# Patient Record
Sex: Male | Born: 1955 | Race: Black or African American | Hispanic: No | Marital: Married | Smoking: Former smoker
Health system: Southern US, Community
[De-identification: ages and names within clinical notes are randomized; demographics above are authoritative.]

## PROBLEM LIST (undated history)

## (undated) DIAGNOSIS — R760 Raised antibody titer: Secondary | ICD-10-CM

## (undated) DIAGNOSIS — R058 Other specified cough: Secondary | ICD-10-CM

## (undated) DIAGNOSIS — D6861 Antiphospholipid syndrome: Secondary | ICD-10-CM

## (undated) DIAGNOSIS — I81 Portal vein thrombosis: Secondary | ICD-10-CM

## (undated) DIAGNOSIS — U071 COVID-19: Secondary | ICD-10-CM

## (undated) DIAGNOSIS — R7303 Prediabetes: Secondary | ICD-10-CM

## (undated) DIAGNOSIS — Z973 Presence of spectacles and contact lenses: Secondary | ICD-10-CM

## (undated) DIAGNOSIS — I10 Essential (primary) hypertension: Secondary | ICD-10-CM

## (undated) DIAGNOSIS — C61 Malignant neoplasm of prostate: Secondary | ICD-10-CM

## (undated) DIAGNOSIS — T464X5A Adverse effect of angiotensin-converting-enzyme inhibitors, initial encounter: Secondary | ICD-10-CM

## (undated) DIAGNOSIS — R519 Headache, unspecified: Secondary | ICD-10-CM

## (undated) HISTORY — PX: PROSTATE BIOPSY: SHX241

## (undated) HISTORY — PX: TIPS PROCEDURE: SHX808

---

## 2011-06-30 ENCOUNTER — Emergency Department (HOSPITAL_COMMUNITY)
Admission: EM | Admit: 2011-06-30 | Discharge: 2011-06-30 | Disposition: A | Discharge status: Home / Self Care | Payer: Self-pay | Attending: Emergency Medicine | Admitting: Emergency Medicine

## 2011-06-30 ENCOUNTER — Encounter (HOSPITAL_COMMUNITY): Payer: Self-pay | Admitting: *Deleted

## 2011-06-30 ENCOUNTER — Emergency Department (HOSPITAL_COMMUNITY): Payer: Self-pay

## 2011-06-30 DIAGNOSIS — R079 Chest pain, unspecified: Secondary | ICD-10-CM | POA: Insufficient documentation

## 2011-06-30 LAB — BASIC METABOLIC PANEL
BUN: 17 mg/dL (ref 6–23)
Chloride: 102 mEq/L (ref 96–112)
Creatinine, Ser: 1.06 mg/dL (ref 0.50–1.35)
GFR calc Af Amer: 89 mL/min — ABNORMAL LOW (ref >90)

## 2011-06-30 LAB — CBC
HCT: 37.9 % — ABNORMAL LOW (ref 39.0–52.0)
MCV: 78.3 fL (ref 78.0–100.0)
RDW: 14.3 % (ref 11.5–15.5)
WBC: 4.7 10*3/uL (ref 4.0–10.5)

## 2011-06-30 MED ORDER — GI COCKTAIL ~~LOC~~
30.0000 mL | Freq: Once | ORAL | Status: AC
  Administered 2011-06-30: 30 mL via ORAL
  Filled 2011-06-30: qty 30

## 2011-06-30 MED ORDER — OMEPRAZOLE 20 MG PO CPDR: 20.0000 mg | DELAYED_RELEASE_CAPSULE | Freq: Every day | ORAL | Status: DC

## 2011-06-30 NOTE — ED Provider Notes (Signed)
History     CSN: [id number]  Arrival date & time 06/30/11  1844   First MD Initiated Contact with Patient 06/30/11 1943      Chief Complaint  Patient presents with  . Chest Pain     The history is provided by the patient.   the patient reports developing severe chest pain this morning that started approximately 11 AM and lasted for a few seconds.  He had no associated symptoms.  He then went and took a long nap.  He awoke and had some slight discomfort in his chest it was described as a tightness that lasted for seconds as well.  At this time he denies all chest discomfort but reports that he scared to take a deep breath because he thinks this may cause the pain to return.  When he takes deep breaths at the bedside now he reports no chest pain.  He denies unilateral leg swelling.  No prior history of DVT or pulmonary embolism.  History reviewed. No pertinent past medical history.  History reviewed. No pertinent past surgical history.  No family history on file.  History  Substance Use Topics  . Smoking status: Never Smoker   . Smokeless tobacco: Not on file  . Alcohol Use: No      Review of Systems  Cardiovascular: Positive for chest pain.  All other systems reviewed and are negative.    Allergies  Review of patient's allergies indicates no known allergies.  Home Medications  No current outpatient prescriptions on file.  BP 133/94  Pulse 69  Temp(Src) 98.1 F (36.7 C) (Oral)  Resp 20  SpO2 100%  Physical Exam  Nursing note and vitals reviewed. Constitutional: He is oriented to person, place, and time. He appears well-developed and well-nourished.  HENT:  Head: Normocephalic and atraumatic.  Eyes: EOM are normal.  Neck: Normal range of motion.  Cardiovascular: Normal rate, regular rhythm, normal heart sounds and intact distal pulses.   Pulmonary/Chest: Effort normal and breath sounds normal. No respiratory distress.  Abdominal: Soft. He exhibits no  distension. There is no tenderness.  Musculoskeletal: Normal range of motion.  Neurological: He is alert and oriented to person, place, and time.  Skin: Skin is warm and dry.  Psychiatric: He has a normal mood and affect. Judgment normal.    ED Course  Procedures (including critical care time)   Date: 06/30/2011  Rate: 60  Rhythm: normal sinus rhythm  QRS Axis: normal  Intervals: normal  ST/T Wave abnormalities: normal  Conduction Disutrbances: none  Narrative Interpretation:   Old EKG Reviewed: No significant changes noted     Labs Reviewed  CBC - Abnormal; Notable for the following:    HCT 37.9 (*)    All other components within normal limits  BASIC METABOLIC PANEL - Abnormal; Notable for the following:    GFR calc non Af Amer 77 (*)    GFR calc Af Amer 89 (*)    All other components within normal limits  TROPONIN I   Dg Chest 2 View  06/30/2011  *RADIOLOGY REPORT*  Clinical Data: 56 year old male with chest pain.  CHEST - 2 VIEW  Comparison: None.  Findings: Lung volumes are within normal limits.  Cardiac size and mediastinal contours are within normal limits.  Visualized tracheal air column is within normal limits.  No pneumothorax, pulmonary edema, pleural effusion or confluent pulmonary opacity. No acute osseous abnormality identified.  IMPRESSION: No acute cardiopulmonary abnormality.  Original Report Authenticated By: Ulla Potash III,  M.D.     1. Chest pain       MDM  The patient's chest pain was transient.  I suspect this is GI related chest pain.  My suspicion for ACS is very low.  EKG is normal.  Single set of troponins is normal.  DC home with close followup with primary care Dr.  He knows to return the emergency department for new or worsening symptoms        Lyanne Co, MD 06/30/11 2156

## 2011-06-30 NOTE — ED Notes (Signed)
Pt reports sharp sudden chest pain while laying down when he got home from work this am at 1100.  Pt reports pain last only for a couple of secs.  Pt states "i kinda wait around and i even went to sleep on it."  Pt reports having a funny feeling in his chest but not chest pain.  Pt states he's scared to breathe too hard d/t pain recurrence.

## 2011-06-30 NOTE — Discharge Instructions (Signed)
Chest Pain (Nonspecific) It is often hard to give a specific diagnosis for the cause of chest pain. There is always a chance that your pain could be related to something serious, such as a heart attack or a blood clot in the lungs. You need to follow up with your caregiver for further evaluation. CAUSES   Heartburn.   Pneumonia or bronchitis.   Anxiety or stress.   Inflammation around your heart (pericarditis) or lung (pleuritis or pleurisy).   A blood clot in the lung.   A collapsed lung (pneumothorax). It can develop suddenly on its own (spontaneous pneumothorax) or from injury (trauma) to the chest.   Shingles infection (herpes zoster virus).  The chest wall is composed of bones, muscles, and cartilage. Any of these can be the source of the pain.  The bones can be bruised by injury.   The muscles or cartilage can be strained by coughing or overwork.   The cartilage can be affected by inflammation and become sore (costochondritis).  DIAGNOSIS  Lab tests or other studies, such as X-rays, electrocardiography, stress testing, or cardiac imaging, may be needed to find the cause of your pain.  TREATMENT   Treatment depends on what may be causing your chest pain. Treatment may include:   Acid blockers for heartburn.   Anti-inflammatory medicine.   Pain medicine for inflammatory conditions.   Antibiotics if an infection is present.   You may be advised to change lifestyle habits. This includes stopping smoking and avoiding alcohol, caffeine, and chocolate.   You may be advised to keep your head raised (elevated) when sleeping. This reduces the chance of acid going backward from your stomach into your esophagus.   Most of the time, nonspecific chest pain will improve within 2 to 3 days with rest and mild pain medicine.  HOME CARE INSTRUCTIONS   If antibiotics were prescribed, take your antibiotics as directed. Finish them even if you start to feel better.   For the next few  days, avoid physical activities that bring on chest pain. Continue physical activities as directed.   Do not smoke.   Avoid drinking alcohol.   Only take over-the-counter or prescription medicine for pain, discomfort, or fever as directed by your caregiver.   Follow your caregiver's suggestions for further testing if your chest pain does not go away.   Keep any follow-up appointments you made. If you do not go to an appointment, you could develop lasting (chronic) problems with pain. If there is any problem keeping an appointment, you must call to reschedule.  SEEK MEDICAL CARE IF:   You think you are having problems from the medicine you are taking. Read your medicine instructions carefully.   Your chest pain does not go away, even after treatment.   You develop a rash with blisters on your chest.  SEEK IMMEDIATE MEDICAL CARE IF:   You have increased chest pain or pain that spreads to your arm, neck, jaw, back, or abdomen.   You develop shortness of breath, an increasing cough, or you are coughing up blood.   You have severe back or abdominal pain, feel nauseous, or vomit.   You develop severe weakness, fainting, or chills.   You have a fever.  THIS IS AN EMERGENCY. Do not wait to see if the pain will go away. Get medical help at once. Call your local emergency services (911 in [place] Do not drive yourself to the hospital. MAKE SURE YOU:   Understand these instructions.     Will watch your condition.   Will get help right away if you are not doing well or get worse.  Document Released: 11/20/2004 Document Revised: 01/30/2011 Document Reviewed: 09/16/2007 [employer] Patient Information 2012 [employer]. 

## 2014-06-21 DIAGNOSIS — Z79899 Other long term (current) drug therapy: Secondary | ICD-10-CM | POA: Diagnosis not present

## 2014-06-21 DIAGNOSIS — Z041 Encounter for examination and observation following transport accident: Secondary | ICD-10-CM | POA: Insufficient documentation

## 2014-06-21 DIAGNOSIS — Y92410 Unspecified street and highway as the place of occurrence of the external cause: Secondary | ICD-10-CM | POA: Insufficient documentation

## 2014-06-21 DIAGNOSIS — Y9389 Activity, other specified: Secondary | ICD-10-CM | POA: Insufficient documentation

## 2014-06-21 DIAGNOSIS — Y998 Other external cause status: Secondary | ICD-10-CM | POA: Insufficient documentation

## 2014-06-22 ENCOUNTER — Encounter (HOSPITAL_COMMUNITY): Payer: Self-pay | Admitting: Emergency Medicine

## 2014-06-22 ENCOUNTER — Emergency Department (HOSPITAL_COMMUNITY)
Admission: EM | Admit: 2014-06-22 | Discharge: 2014-06-22 | Disposition: A | Discharge status: Home / Self Care | Payer: No Typology Code available for payment source | Attending: Emergency Medicine | Admitting: Emergency Medicine

## 2014-06-22 DIAGNOSIS — Z0000 Encounter for general adult medical examination without abnormal findings: Secondary | ICD-10-CM

## 2014-06-22 MED ORDER — IBUPROFEN 800 MG PO TABS: 800.0000 mg | ORAL_TABLET | Freq: Three times a day (TID) | ORAL | Status: DC
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
## 2014-06-22 NOTE — ED Notes (Signed)
Pt ambulating independently w/ steady gait on d/c in no acute distress, A&Ox4. D/c instructions reviewed w/ pt and family - pt and family deny any further questions or concerns at present. Rx given x1  

## 2014-06-22 NOTE — Discharge Instructions (Signed)
Cryotherapy °Cryotherapy means treatment with cold. Ice or gel packs can be used to reduce both pain and swelling. Ice is the most helpful within the first 24 to 48 hours after an injury or flare-up from overusing a muscle or joint. Sprains, strains, spasms, burning pain, shooting pain, and aches can all be eased with ice. Ice can also be used when recovering from surgery. Ice is effective, has very few side effects, and is safe for most people to use. °PRECAUTIONS  °Ice is not a safe treatment option for people with: °· Raynaud phenomenon. This is a condition affecting small blood vessels in the extremities. Exposure to cold may cause your problems to return. °· Cold hypersensitivity. There are many forms of cold hypersensitivity, including: °¨ Cold urticaria. Red, itchy hives appear on the skin when the tissues begin to warm after being iced. °¨ Cold erythema. This is a red, itchy rash caused by exposure to cold. °¨ Cold hemoglobinuria. Red blood cells break down when the tissues begin to warm after being iced. The hemoglobin that carry oxygen are passed into the urine because they cannot combine with blood proteins fast enough. °· Numbness or altered sensitivity in the area being iced. °If you have any of the following conditions, do not use ice until you have discussed cryotherapy with your caregiver: °· Heart conditions, such as arrhythmia, angina, or chronic heart disease. °· High blood pressure. °· Healing wounds or open skin in the area being iced. °· Current infections. °· Rheumatoid arthritis. °· Poor circulation. °· Diabetes. °Ice slows the blood flow in the region it is applied. This is beneficial when trying to stop inflamed tissues from spreading irritating chemicals to surrounding tissues. However, if you expose your skin to cold temperatures for too long or without the proper protection, you can damage your skin or nerves. Watch for signs of skin damage due to cold. °HOME CARE INSTRUCTIONS °Follow  these tips to use ice and cold packs safely. °· Place a dry or damp towel between the ice and skin. A damp towel will cool the skin more quickly, so you may need to shorten the time that the ice is used. °· For a more rapid response, add gentle compression to the ice. °· Ice for no more than 10 to 20 minutes at a time. The bonier the area you are icing, the less time it will take to get the benefits of ice. °· Check your skin after 5 minutes to make sure there are no signs of a poor response to cold or skin damage. °· Rest 20 minutes or more between uses. °· Once your skin is numb, you can end your treatment. You can test numbness by very lightly touching your skin. The touch should be so light that you do not see the skin dimple from the pressure of your fingertip. When using ice, most people will feel these normal sensations in this order: cold, burning, aching, and numbness. °· Do not use ice on someone who cannot communicate their responses to pain, such as small children or people with dementia. °HOW TO MAKE AN ICE PACK °Ice packs are the most common way to use ice therapy. Other methods include ice massage, ice baths, and cryosprays. Muscle creams that cause a cold, tingly feeling do not offer the same benefits that ice offers and should not be used as a substitute unless recommended by your caregiver. °To make an ice pack, do one of the following: °· Place crushed ice or a   bag of frozen vegetables in a sealable plastic bag. Squeeze out the excess air. Place this bag inside another plastic bag. Slide the bag into a pillowcase or place a damp towel between your skin and the bag.  Mix 3 parts water with 1 part rubbing alcohol. Freeze the mixture in a sealable plastic bag. When you remove the mixture from the freezer, it will be slushy. Squeeze out the excess air. Place this bag inside another plastic bag. Slide the bag into a pillowcase or place a damp towel between your skin and the bag. SEEK MEDICAL CARE  IF:  You develop white spots on your skin. This may give the skin a blotchy (mottled) appearance.  Your skin turns blue or pale.  Your skin becomes waxy or hard.  Your swelling gets worse. MAKE SURE YOU:   Understand these instructions.  Will watch your condition.  Will get help right away if you are not doing well or get worse. Document Released: 10/07/2010 Document Revised: 06/27/2013 Document Reviewed: 10/07/2010 [facility] Patient Information 2015 [place], [place]. This information is not intended to replace advice given to you by your health care provider. Make sure you discuss any questions you have with your health care provider. Motor Vehicle Collision It is common to have multiple bruises and sore muscles after a motor vehicle collision (MVC). These tend to feel worse for the first 24 hours. You may have the most stiffness and soreness over the first several hours. You may also feel worse when you wake up the first morning after your collision. After this point, you will usually begin to improve with each day. The speed of improvement often depends on the severity of the collision, the number of injuries, and the location and nature of these injuries. HOME CARE INSTRUCTIONS  Put ice on the injured area.  Put ice in a plastic bag.  Place a towel between your skin and the bag.  Leave the ice on for 15-20 minutes, 3-4 times a day, or as directed by your health care provider.  Drink enough fluids to keep your urine clear or pale yellow. Do not drink alcohol.  Take a warm shower or bath once or twice a day. This will increase blood flow to sore muscles.  You may return to activities as directed by your caregiver. Be careful when lifting, as this may aggravate neck or back pain.  Only take over-the-counter or prescription medicines for pain, discomfort, or fever as directed by your caregiver. Do not use aspirin. This may increase bruising and bleeding. SEEK IMMEDIATE MEDICAL CARE  IF:  You have numbness, tingling, or weakness in the arms or legs.  You develop severe headaches not relieved with medicine.  You have severe neck pain, especially tenderness in the middle of the back of your neck.  You have changes in bowel or bladder control.  There is increasing pain in any area of the body.  You have shortness of breath, light-headedness, dizziness, or fainting.  You have chest pain.  You feel sick to your stomach (nauseous), throw up (vomit), or sweat.  You have increasing abdominal discomfort.  There is blood in your urine, stool, or vomit.  You have pain in your shoulder (shoulder strap areas).  You feel your symptoms are getting worse. MAKE SURE YOU:  Understand these instructions.  Will watch your condition.  Will get help right away if you are not doing well or get worse. Document Released: 02/10/2005 Document Revised: 06/27/2013 Document Reviewed: 07/10/2010 [employer] Patient  Information 2015 [place], [place]. This information is not intended to replace advice given to you by your health care provider. Make sure you discuss any questions you have with your health care provider.

## 2016-01-07 DIAGNOSIS — K5790 Diverticulosis of intestine, part unspecified, without perforation or abscess without bleeding: Secondary | ICD-10-CM | POA: Insufficient documentation

## 2016-04-29 ENCOUNTER — Emergency Department (HOSPITAL_COMMUNITY)
Admission: EM | Admit: 2016-04-29 | Discharge: 2016-04-29 | Disposition: A | Discharge status: Home / Self Care | Payer: No Typology Code available for payment source | Attending: Emergency Medicine | Admitting: Emergency Medicine

## 2016-04-29 ENCOUNTER — Encounter (HOSPITAL_COMMUNITY): Payer: Self-pay

## 2016-04-29 ENCOUNTER — Emergency Department (HOSPITAL_COMMUNITY): Payer: No Typology Code available for payment source

## 2016-04-29 DIAGNOSIS — Y939 Activity, unspecified: Secondary | ICD-10-CM | POA: Insufficient documentation

## 2016-04-29 DIAGNOSIS — S299XXA Unspecified injury of thorax, initial encounter: Secondary | ICD-10-CM | POA: Diagnosis present

## 2016-04-29 DIAGNOSIS — S29011A Strain of muscle and tendon of front wall of thorax, initial encounter: Secondary | ICD-10-CM

## 2016-04-29 DIAGNOSIS — Y999 Unspecified external cause status: Secondary | ICD-10-CM | POA: Diagnosis not present

## 2016-04-29 DIAGNOSIS — Y92410 Unspecified street and highway as the place of occurrence of the external cause: Secondary | ICD-10-CM | POA: Diagnosis not present

## 2016-04-29 DIAGNOSIS — M7918 Myalgia, other site: Secondary | ICD-10-CM

## 2016-04-29 DIAGNOSIS — S4992XA Unspecified injury of left shoulder and upper arm, initial encounter: Secondary | ICD-10-CM | POA: Insufficient documentation

## 2016-04-29 MED ORDER — NAPROXEN 500 MG PO TABS: 500.0000 mg | ORAL_TABLET | Freq: Two times a day (BID) | ORAL | 0 refills | Status: DC

## 2016-04-29 MED ORDER — METHOCARBAMOL 500 MG PO TABS
500.0000 mg | ORAL_TABLET | Freq: Three times a day (TID) | ORAL | 0 refills | Status: DC | PRN
Start: 2016-04-29 — End: 2018-03-18

## 2016-04-29 NOTE — ED Notes (Signed)
See EDP assessment 

## 2016-04-29 NOTE — ED Notes (Signed)
Patient transported to X-ray 
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
## 2016-04-29 NOTE — Discharge Instructions (Signed)
Talk to your employer about the possibility of seeing an [place]. Rx for anti-inflammatory and muscle relaxant.

## 2016-11-17 DIAGNOSIS — R030 Elevated blood-pressure reading, without diagnosis of hypertension: Secondary | ICD-10-CM | POA: Insufficient documentation

## 2016-11-17 DIAGNOSIS — E669 Obesity, unspecified: Secondary | ICD-10-CM | POA: Insufficient documentation

## 2016-12-14 ENCOUNTER — Encounter (HOSPITAL_COMMUNITY): Payer: Self-pay

## 2016-12-14 ENCOUNTER — Emergency Department (HOSPITAL_COMMUNITY)
Admission: EM | Admit: 2016-12-14 | Discharge: 2016-12-14 | Disposition: A | Discharge status: Home / Self Care | Payer: BLUE CROSS/BLUE SHIELD | Attending: Emergency Medicine | Admitting: Emergency Medicine

## 2016-12-14 DIAGNOSIS — Z79899 Other long term (current) drug therapy: Secondary | ICD-10-CM | POA: Insufficient documentation

## 2016-12-14 DIAGNOSIS — R21 Rash and other nonspecific skin eruption: Secondary | ICD-10-CM | POA: Diagnosis not present

## 2016-12-14 MED ORDER — NYSTATIN-TRIAMCINOLONE 100000-0.1 UNIT/GM-% EX CREA: TOPICAL_CREAM | CUTANEOUS | 0 refills | Status: DC

## 2016-12-14 NOTE — ED Triage Notes (Signed)
Pt reports a pruritic sore on his penis. He denies unprotected sex with anyone, but his wife. Denies urinary issues or penile drainage. No fevers. A&Ox4. Ambulatory.

## 2016-12-14 NOTE — ED Provider Notes (Signed)
[facility] DEPT Provider Note   CSN: [id number] Arrival date & time: 12/14/16  1513     History   Chief Complaint Chief Complaint  Patient presents with  . Exposure to STD    HPI Harry Lucero is a 61 y.o. male who presents to the ED with sores to his penis. He denies sex with anyone other than his wife for over 20 years and is not concerned about STD. No fever or chills, no dysuria, no discharge. Patient c/o itching.  HPI  History reviewed. No pertinent past medical history.  There are no active problems to display for this patient.   History reviewed. No pertinent surgical history.     Home Medications    Prior to Admission medications   Medication Sig Start Date End Date Taking? Authorizing Provider  ibuprofen (ADVIL,MOTRIN) 800 MG tablet Take 1 tablet (800 mg total) by mouth 3 (three) times daily. 06/22/14   Charlann Lange, PA-C  methocarbamol (ROBAXIN) 500 MG tablet Take 1 tablet (500 mg total) by mouth 3 (three) times daily between meals as needed. 04/29/16   Tanna Furry, MD  naproxen (NAPROSYN) 500 MG tablet Take 1 tablet (500 mg total) by mouth 2 (two) times daily. 04/29/16   Tanna Furry, MD  nystatin-triamcinolone [facility] II) cream Apply to affected area daily 12/14/16   Ashley Murrain, NP  omeprazole (PRILOSEC) 20 MG capsule Take 1 capsule (20 mg total) by mouth daily. 06/30/11 06/29/12  Jola Schmidt, MD    Family History History reviewed. No pertinent family history.  Social History Social History  Substance Use Topics  . Smoking status: Never Smoker  . Smokeless tobacco: Never Used  . Alcohol use No     Allergies   Patient has no known allergies.   Review of Systems Review of Systems  Constitutional: Negative for chills and fever.  Skin: Positive for rash.  All other systems reviewed and are negative.    Physical Exam Updated Vital Signs BP 132/78 (BP Location: Left Arm)   Pulse 65   Temp 98 F (36.7 C) (Oral)    Resp 18   SpO2 100%   Physical Exam  Constitutional: He is oriented to person, place, and time. He appears well-developed and well-nourished. No distress.  Eyes: EOM are normal.  Neck: Neck supple.  Cardiovascular: Normal rate.   Pulmonary/Chest: Effort normal.  Abdominal: Soft. There is no tenderness.  Genitourinary: Testes normal. Circumcised. No penile erythema or penile tenderness. No discharge found.  Genitourinary Comments: Small dry patchy area to the head of the penis that is pruritic   Musculoskeletal: Normal range of motion.  Lymphadenopathy: No inguinal adenopathy noted on the right or left side.  Neurological: He is alert and oriented to person, place, and time. No cranial nerve deficit.  Skin: Skin is warm and dry.  Nursing note and vitals reviewed.    ED Treatments / Results  Labs (all labs ordered are listed, but only abnormal results are displayed) Labs Reviewed - No data to display   Radiology No results found.  Procedures Procedures (including critical care time)  Medications Ordered in ED Medications - No data to display   Initial Impression / Assessment and Plan / ED Course  I have reviewed the triage vital signs and the nursing notes. 61 y.o. male with dry patchy area to the head of his penis x 1 week that itches. Will treat for possible yeast and if symptoms persist he will f/u with dermatology.  Final Clinical Impressions(s) / ED Diagnoses   Final diagnoses:  Penile rash    New Prescriptions New Prescriptions   NYSTATIN-TRIAMCINOLONE (MYCOLOG II) CREAM    Apply to affected area daily     Debroah Baller Elizabeth, NP 12/14/16 1646    Tanna Furry, MD 01/02/17 [phone]

## 2016-12-14 NOTE — Discharge Instructions (Signed)
Your exam today shows a dry patchy area on the head of your penis that may be a yeast infection. Use the cream as directed. If the symptoms are not improving over the next week call Dr. Juel Burrow office to schedule follow up.

## 2018-03-18 ENCOUNTER — Encounter (HOSPITAL_COMMUNITY): Payer: Self-pay

## 2018-03-18 ENCOUNTER — Emergency Department (HOSPITAL_COMMUNITY): Payer: BLUE CROSS/BLUE SHIELD

## 2018-03-18 ENCOUNTER — Emergency Department (HOSPITAL_COMMUNITY)
Admission: EM | Admit: 2018-03-18 | Discharge: 2018-03-19 | Disposition: A | Discharge status: Home / Self Care | Payer: BLUE CROSS/BLUE SHIELD | Attending: Emergency Medicine | Admitting: Emergency Medicine

## 2018-03-18 DIAGNOSIS — R1012 Left upper quadrant pain: Secondary | ICD-10-CM | POA: Diagnosis present

## 2018-03-18 DIAGNOSIS — Z79899 Other long term (current) drug therapy: Secondary | ICD-10-CM | POA: Diagnosis not present

## 2018-03-18 LAB — URINALYSIS, ROUTINE W REFLEX MICROSCOPIC
Bilirubin Urine: NEGATIVE
Glucose, UA: NEGATIVE mg/dL
Hgb urine dipstick: NEGATIVE
Ketones, ur: NEGATIVE mg/dL
LEUKOCYTES UA: NEGATIVE
Nitrite: NEGATIVE
PROTEIN: NEGATIVE mg/dL
Specific Gravity, Urine: 1.024 (ref 1.005–1.030)
pH: 5 (ref 5.0–8.0)

## 2018-03-18 LAB — COMPREHENSIVE METABOLIC PANEL
ALT: 54 U/L — ABNORMAL HIGH (ref 0–44)
ANION GAP: 8 (ref 5–15)
AST: 34 U/L (ref 15–41)
Albumin: 3.8 g/dL (ref 3.5–5.0)
Alkaline Phosphatase: 60 U/L (ref 38–126)
BUN: 13 mg/dL (ref 8–23)
CHLORIDE: 106 mmol/L (ref 98–111)
CO2: 25 mmol/L (ref 22–32)
Calcium: 9.6 mg/dL (ref 8.9–10.3)
Creatinine, Ser: 1.2 mg/dL (ref 0.61–1.24)
GFR calc Af Amer: >60 mL/min (ref >60)
GFR calc non Af Amer: >60 mL/min (ref >60)
Glucose, Bld: 102 mg/dL — ABNORMAL HIGH (ref 70–99)
Potassium: 4.1 mmol/L (ref 3.5–5.1)
SODIUM: 139 mmol/L (ref 135–145)
Total Bilirubin: 0.9 mg/dL (ref 0.3–1.2)
Total Protein: 7.2 g/dL (ref 6.5–8.1)

## 2018-03-18 LAB — CBC
HCT: 42.2 % (ref 39.0–52.0)
Hemoglobin: 13.9 g/dL (ref 13.0–17.0)
MCH: 27 pg (ref 26.0–34.0)
MCHC: 32.9 g/dL (ref 30.0–36.0)
MCV: 81.9 fL (ref 80.0–100.0)
NRBC: 0 % (ref 0.0–0.2)
PLATELETS: 290 10*3/uL (ref 150–400)
RBC: 5.15 MIL/uL (ref 4.22–5.81)
RDW: 14.4 % (ref 11.5–15.5)
WBC: 4.4 10*3/uL (ref 4.0–10.5)

## 2018-03-18 LAB — LIPASE, BLOOD: LIPASE: 42 U/L (ref 11–51)

## 2018-03-18 MED ORDER — LIDOCAINE VISCOUS HCL 2 % MT SOLN
15.0000 mL | Freq: Once | OROMUCOSAL | Status: AC
  Administered 2018-03-18: 15 mL via ORAL
  Filled 2018-03-18: qty 15

## 2018-03-18 MED ORDER — ACETAMINOPHEN 500 MG PO TABS
1000.0000 mg | ORAL_TABLET | Freq: Once | ORAL | Status: AC
Start: 2018-03-18 — End: 2018-03-18
  Administered 2018-03-18: 1000 mg via ORAL
  Filled 2018-03-18: qty 2

## 2018-03-18 MED ORDER — SODIUM CHLORIDE 0.9% FLUSH: 3.0000 mL | Freq: Once | INTRAVENOUS | Status: DC

## 2018-03-18 MED ORDER — ALUM & MAG HYDROXIDE-SIMETH 200-200-20 MG/5ML PO SUSP
30.0000 mL | Freq: Once | ORAL | Status: AC
  Administered 2018-03-18: 30 mL via ORAL
  Filled 2018-03-18: qty 30

## 2018-03-18 NOTE — ED Triage Notes (Signed)
Pt endorses LUQ pain since this morning. Denies n/v/d. VSS.

## 2018-03-18 NOTE — ED Provider Notes (Signed)
[facility] EMERGENCY DEPARTMENT Provider Note   CSN: [id number] Arrival date & time: 03/18/18  1615     History   Chief Complaint Chief Complaint  Patient presents with  . Abdominal Pain    HPI Harry Lucero is a 63 y.o. male with a hx of no major medical problems presents to the Emergency Department complaining of gradual, persistent, progressively worsening LUQ and pain onset 9am Wed morning (36 hrs ago).  Pt reports the pain improved after approx 3 hours but did not resolve.  Pt reports initially, he had sweating and 10/10 pain, but it improved to a 8/10.  Pt reports pain is worsened with coughing, sneeze, breathing, and moving.  Pt reports there are no specific alleviating factors.  Pt denies fever, chills, headache, neck pain, chest pain, nausea, vomiting, diarrhea, weakness, dizziness, syncope.  Pt reports no symptoms like this before. Pt reports last BM was this AM and described as hard. Pt reports daily BMs, but they are often hard but does not require straining.  Denies melena or hematochezia.  Pt reports car trip to [place] around Christmas.  Denies leg swelling, palpitations, hemoptysis, h/o blood clot, SOB.  Pt does not regularly take NSAIDs.  No hx of gastric ulcer.    The history is provided by the patient, the spouse and medical records. No language interpreter was used.    History reviewed. No pertinent past medical history.  There are no active problems to display for this patient.   History reviewed. No pertinent surgical history.      Home Medications    Prior to Admission medications   Medication Sig Start Date End Date Taking? Authorizing Provider  ibuprofen (ADVIL,MOTRIN) 800 MG tablet Take 1 tablet (800 mg total) by mouth 3 (three) times daily. 06/22/14  Yes Upstill, Nehemiah Settle, PA-C  ranitidine (ZANTAC) 150 MG tablet Take 1 tablet (150 mg total) by mouth 2 (two) times daily. 03/19/18   Nateisha Moyd, Jarrett Soho, PA-C    Family History History  reviewed. No pertinent family history.  Social History Social History   Tobacco Use  . Smoking status: Never Smoker  . Smokeless tobacco: Never Used  Substance Use Topics  . Alcohol use: No  . Drug use: No     Allergies   Patient has no known allergies.   Review of Systems Review of Systems  Constitutional: Negative for appetite change, diaphoresis, fatigue, fever and unexpected weight change.  HENT: Negative for mouth sores.   Eyes: Negative for visual disturbance.  Respiratory: Negative for cough, chest tightness, shortness of breath and wheezing.   Cardiovascular: Negative for chest pain.  Gastrointestinal: Positive for abdominal pain. Negative for constipation, diarrhea, nausea and vomiting.  Endocrine: Negative for polydipsia, polyphagia and polyuria.  Genitourinary: Negative for dysuria, frequency, hematuria and urgency.  Musculoskeletal: Negative for back pain and neck stiffness.  Skin: Negative for rash.  Allergic/Immunologic: Negative for immunocompromised state.  Neurological: Negative for syncope, light-headedness and headaches.  Hematological: Does not bruise/bleed easily.  Psychiatric/Behavioral: Negative for sleep disturbance. The patient is not nervous/anxious.      Physical Exam Updated Vital Signs BP (!) 144/93 (BP Location: Right Arm)   Pulse 68   Temp 98.1 F (36.7 C) (Oral)   Resp 18   SpO2 98%   Physical Exam Vitals signs and nursing note reviewed.  Constitutional:      General: He is not in acute distress.    Appearance: He is well-developed. He is not diaphoretic.  Comments: Awake, alert, nontoxic appearance  HENT:     Head: Normocephalic and atraumatic.     Mouth/Throat:     Pharynx: No oropharyngeal exudate.  Eyes:     General: No scleral icterus.    Conjunctiva/sclera: Conjunctivae normal.  Neck:     Musculoskeletal: Normal range of motion and neck supple.  Cardiovascular:     Rate and Rhythm: Normal rate and regular rhythm.    Pulmonary:     Effort: Pulmonary effort is normal. No respiratory distress.     Breath sounds: Normal breath sounds. No wheezing.  Abdominal:     General: Bowel sounds are normal.     Palpations: Abdomen is soft. There is no mass.     Tenderness: There is abdominal tenderness in the left upper quadrant. There is no guarding or rebound.  Musculoskeletal: Normal range of motion.  Skin:    General: Skin is warm and dry.  Neurological:     Mental Status: He is alert.     Comments: Speech is clear and goal oriented Moves extremities without ataxia      ED Treatments / Results  Labs (all labs ordered are listed, but only abnormal results are displayed) Labs Reviewed  COMPREHENSIVE METABOLIC PANEL - Abnormal; Notable for the following components:      Result Value   Glucose, Bld 102 (*)    ALT 54 (*)    All other components within normal limits  LIPASE, BLOOD  CBC  URINALYSIS, ROUTINE W REFLEX MICROSCOPIC    Radiology Dg Abd Acute 2+v W 1v Chest  Result Date: 03/18/2018 CLINICAL DATA:  LEFT upper quadrant pain tonight for 20 minutes. EXAM: DG ABDOMEN ACUTE W/ 1V CHEST COMPARISON:  Chest radiograph Jun 30, 2011 FINDINGS: Cardiomediastinal silhouette is normal. Low inspiratory examination. Mild bronchitic changes. No pleural effusion or focal consolidation. No pneumothorax. Soft tissue planes and included osseous structures are unremarkable. Bowel gas pattern is nondilated and nonobstructive. No intra-abdominal mass effect, pathologic calcifications or free air. Phleboliths project RIGHT pelvis. Soft tissue planes and included osseous structures are non-suspicious. IMPRESSION: 1. Bronchitic changes without focal consolidation. 2. Normal bowel gas pattern. Electronically Signed   By: Elon Alas M.D.   On: 03/18/2018 23:32    Procedures Procedures (including critical care time)  Medications Ordered in ED Medications  sodium chloride flush (NS) 0.9 % injection 3 mL (has no  administration in time range)  acetaminophen (TYLENOL) tablet 1,000 mg (1,000 mg Oral Given 03/18/18 2351)  alum & mag hydroxide-simeth (MAALOX/MYLANTA) 200-200-20 MG/5ML suspension 30 mL (30 mLs Oral Given 03/18/18 2351)    And  lidocaine (XYLOCAINE) 2 % viscous mouth solution 15 mL (15 mLs Oral Given 03/18/18 2351)     Initial Impression / Assessment and Plan / ED Course  I have reviewed the triage vital signs and the nursing notes.  Pertinent labs & imaging results that were available during my care of the patient were reviewed by me and considered in my medical decision making (see chart for details).     [place] with left upper quadrant abdominal pain for the last 36 hours.  Labs are very reassuring.  Patient denies current alcohol usage, NSAID usage.  He has no history of peptic ulcer disease.  Plain films without evidence of free air, left lower lobe pneumonia, pneumothorax or pulmonary edema.  Moderate stool burden noted.  Abdomen is soft with minimal tenderness.  No rebound or guarding.  On repeat exam patient with improved pain.  Abdomen remains  soft and is nontender at this time.  Suspect gastritis versus potential peptic ulcer disease.  Will begin patient on Zantac and he will follow with gastroenterology.  Findings including x-rays discussed with patient and wife.  Also discussed need for follow-up and reasons to return immediately to the emergency department.  Patient states understanding and is in agreement with the plan.  BP (!) 150/95   Pulse 65   Temp 98.1 F (36.7 C) (Oral)   Resp 18   SpO2 99%    Final Clinical Impressions(s) / ED Diagnoses   Final diagnoses:  LUQ abdominal pain    ED Discharge Orders         Ordered    ranitidine (ZANTAC) 150 MG tablet  2 times daily     03/19/18 0032           Felicidad Sugarman, Gwenlyn Perking 03/19/18 0035    Drenda Freeze, MD 03/20/18 [phone]

## 2018-03-18 NOTE — ED Notes (Signed)
Patient transported to X-ray 

## 2018-03-19 MED ORDER — RANITIDINE HCL 150 MG PO TABS: 150.0000 mg | ORAL_TABLET | Freq: Two times a day (BID) | ORAL | 0 refills | Status: DC

## 2018-03-19 NOTE — Discharge Instructions (Addendum)
1. Medications: Zantac, usual home medications 2. Treatment: rest, drink plenty of fluids, advance diet slowly 3. Follow Up: Please followup with your primary doctor in 2 days for discussion of your diagnoses and further evaluation after today's visit; if you do not have a primary care doctor use the resource guide provided to find one; Please return to the ER for persistent vomiting, high fevers or worsening symptoms

## 2019-10-26 DIAGNOSIS — K55069 Acute infarction of intestine, part and extent unspecified: Secondary | ICD-10-CM | POA: Insufficient documentation

## 2019-10-31 DIAGNOSIS — Z9862 Peripheral vascular angioplasty status: Secondary | ICD-10-CM | POA: Insufficient documentation

## 2019-10-31 DIAGNOSIS — Z95828 Presence of other vascular implants and grafts: Secondary | ICD-10-CM | POA: Insufficient documentation

## 2019-11-02 DIAGNOSIS — R7881 Bacteremia: Secondary | ICD-10-CM | POA: Insufficient documentation

## 2019-11-02 HISTORY — PX: IR TIPS: IMG2295

## 2019-11-15 ENCOUNTER — Telehealth: Payer: Self-pay | Admitting: Hematology and Oncology

## 2019-11-15 NOTE — Telephone Encounter (Signed)
Received a new hem referral from [facility] for acute embolism and thrombosis. [name] has been cld and scheduled to see Dr. Lindi Adie on 10/11 at 215pm. Pt aware to arrive 30 minutes early.

## 2019-12-04 NOTE — Progress Notes (Signed)
[facility] CONSULT NOTE  Patient Care Team: System, Provider Not In as PCP - General  CHIEF COMPLAINTS/PURPOSE OF CONSULTATION:  Newly diagnosed acute portal venous thrombosis  HISTORY OF PRESENTING ILLNESS:  Harry Lucero 64 y.o. male is here because of recent diagnosis of acute portal venous thrombosis, currently on Eliquis. He is referred by Dr. Leilani Merl at [facility]. He presents to the clinic today for initial evaluation.  Patient was having increased abdominal pain for the past month or so.  He went to [facility] with the symptoms and underwent CT scans on 10/31/2019.  The scan showed occlusive thrombosis involving the main portal vein and superior mesenteric vein as well as the jejunal and ileal veins.  Nonocclusive thrombus of the splenic vein was also found.  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  He was seen by interventional radiology who performed TIPS procedure and portal vein and mesenteric vein thrombectomy.  They tried to do balloon maceration of the portal vein and SMV thrombus.  Transjugular lysis of portal and mesenteric vein were performed.  After all this is done he was discharged home on Eliquis. He continues to suffer from abdominal pain in spite of being on anticoagulation.  He rates his pain a 7 out of 10.  He also has difficulty with swallowing with the food does not seem to go down easily.  He reports to me that he travels long distances for work but he is not considered himself to be sedentary.  He quit smoking 25 years ago.  I reviewed his records extensively and collaborated the history with the patient.  MEDICAL HISTORY:  No previous past medical problems.  SURGICAL HISTORY: Tips and thrombolysis SOCIAL HISTORY: Denies any tobacco or alcohol or recreational drug use. FAMILY HISTORY: No family history of cancers or blood clots.  ALLERGIES:  has No Known Allergies.  MEDICATIONS:  Current Outpatient Medications  Medication Sig  Dispense Refill  . apixaban (ELIQUIS) 5 MG TABS tablet Take 1 tablet (5 mg total) by mouth 2 (two) times daily. 60 tablet   . ibuprofen (ADVIL,MOTRIN) 800 MG tablet Take 1 tablet (800 mg total) by mouth 3 (three) times daily. 21 tablet 0  . ranitidine (ZANTAC) 150 MG tablet Take 1 tablet (150 mg total) by mouth 2 (two) times daily. 60 tablet 0   No current facility-administered medications for this visit.    REVIEW OF SYSTEMS:   Constitutional: Denies fevers, chills or abnormal night sweats Eyes: Denies blurriness of vision, double vision or watery eyes Ears, nose, mouth, throat, and face: Denies mucositis or sore throat Respiratory: Denies cough, dyspnea or wheezes Cardiovascular: Denies palpitation, chest discomfort or lower extremity swelling Gastrointestinal: Right upper quadrant abdominal pain as well as dysphagia Skin: Denies abnormal skin rashes Lymphatics: Denies new lymphadenopathy or easy bruising Neurological generalized weakness from recent hospitalization Behavioral/Psych: Mood is stable, no new changes  Breast: Denies any palpable lumps or discharge All other systems were reviewed with the patient and are negative.  PHYSICAL EXAMINATION: ECOG PERFORMANCE STATUS: 1 - Symptomatic but completely ambulatory  Vitals:   12/05/19 1440  BP: 118/90  Pulse: 66  Resp: 18  Temp: (!) 97.5 F (36.4 C)  SpO2: 98%   Filed Weights   12/05/19 1440  Weight: 189 lb 3.2 oz (85.8 kg)      LABORATORY DATA:  I have reviewed the data as listed Lab Results  Component Value Date   WBC 4.4 03/18/2018   HGB 13.9  03/18/2018   HCT 42.2 03/18/2018   MCV 81.9 03/18/2018   PLT 290 03/18/2018   Lab Results  Component Value Date   NA 139 03/18/2018   K 4.1 03/18/2018   CL 106 03/18/2018   CO2 25 03/18/2018    RADIOGRAPHIC STUDIES: I have personally reviewed the radiological reports   ASSESSMENT AND PLAN:  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 at [place] when  he presented with abdominal pain SMV thrombosis Splenic vein thrombosis  Current treatment: Anticoagulation with Eliquis  Counseling: I discussed with him extensively inherited and acquired risk factors of blood clots.  Other than driving long distances for work he does not have any acquired risk factors.  He does not have any family history of blood clots however we will perform hypercoagulability panel.  Because this is such a rare blood clot we will also perform PNH flow cytometry.  Plan: Hypercoagulability work-up including flow cytometry for PNH Lupus anticoagulant testing Inherited hypercoagulability risk panel  Return to clinic in 1 week to discuss his test results.   These results could dictate how long patient needs to be on blood thinners.   All questions were answered. The patient knows to call the clinic with any problems, questions or concerns.   Rulon Eisenmenger, MD, MPH 12/05/2019    I, Molly Dorshimer, am acting as scribe for [name], MD.  I have reviewed the above documentation for accuracy and completeness, and I agree with the above.

## 2019-12-05 ENCOUNTER — Inpatient Hospital Stay: Payer: BC Managed Care – PPO

## 2019-12-05 ENCOUNTER — Other Ambulatory Visit: Payer: Self-pay

## 2019-12-05 ENCOUNTER — Inpatient Hospital Stay: Payer: BC Managed Care – PPO | Attending: Hematology and Oncology | Admitting: Hematology and Oncology

## 2019-12-05 DIAGNOSIS — R109 Unspecified abdominal pain: Secondary | ICD-10-CM | POA: Diagnosis not present

## 2019-12-05 DIAGNOSIS — I81 Portal vein thrombosis: Secondary | ICD-10-CM | POA: Insufficient documentation

## 2019-12-05 DIAGNOSIS — K55059 Acute (reversible) ischemia of intestine, part and extent unspecified: Secondary | ICD-10-CM | POA: Diagnosis not present

## 2019-12-05 DIAGNOSIS — Z87891 Personal history of nicotine dependence: Secondary | ICD-10-CM | POA: Insufficient documentation

## 2019-12-05 DIAGNOSIS — I82429 Acute embolism and thrombosis of unspecified iliac vein: Secondary | ICD-10-CM | POA: Diagnosis not present

## 2019-12-05 DIAGNOSIS — Z7901 Long term (current) use of anticoagulants: Secondary | ICD-10-CM | POA: Diagnosis not present

## 2019-12-05 DIAGNOSIS — R1310 Dysphagia, unspecified: Secondary | ICD-10-CM | POA: Diagnosis not present

## 2019-12-05 DIAGNOSIS — I82499 Acute embolism and thrombosis of other specified deep vein of unspecified lower extremity: Secondary | ICD-10-CM | POA: Diagnosis not present

## 2019-12-05 DIAGNOSIS — D735 Infarction of spleen: Secondary | ICD-10-CM | POA: Diagnosis not present

## 2019-12-05 MED ORDER — APIXABAN 5 MG PO TABS: 5.0000 mg | ORAL_TABLET | Freq: Two times a day (BID) | ORAL | Status: DC

## 2019-12-05 NOTE — Assessment & Plan Note (Signed)
Referral notes indicate that the patient had a portal vein thrombosis. Current treatment: Anticoagulation with  Plan: Hypercoagulability work-up including flow cytometry for PNH Lupus anticoagulant testing  Return to clinic to discuss his test results.  These results could dictate how long patient needs to be on blood thinners.

## 2019-12-06 LAB — CARDIOLIPIN ANTIBODIES, IGG, IGM, IGA
Anticardiolipin IgA: <9 APL U/mL (ref 0–11)
Anticardiolipin IgG: 10 GPL U/mL (ref 0–14)
Anticardiolipin IgM: 56 MPL U/mL — ABNORMAL HIGH (ref 0–12)

## 2019-12-06 LAB — ANTITHROMBIN III ANTIGEN: AT III AG PPP IMM-ACNC: 89 % (ref 72–124)

## 2019-12-06 LAB — PROTEIN S, TOTAL: Protein S Ag, Total: 112 % (ref 60–150)

## 2019-12-07 LAB — BETA-2-GLYCOPROTEIN I ABS, IGG/M/A
Beta-2 Glyco I IgG: <9 GPI IgG units (ref 0–20)
Beta-2-Glycoprotein I IgA: <9 GPI IgA units (ref 0–25)
Beta-2-Glycoprotein I IgM: 45 GPI IgM units — ABNORMAL HIGH (ref 0–32)

## 2019-12-07 LAB — PROTEIN C, TOTAL: Protein C, Total: 106 % (ref 60–150)

## 2019-12-07 LAB — DRVVT CONFIRM: dRVVT Confirm: 1.1 ratio (ref 0.8–1.2)

## 2019-12-07 LAB — LUPUS ANTICOAGULANT PANEL
DRVVT: 56.4 s — ABNORMAL HIGH (ref 0.0–47.0)
PTT Lupus Anticoagulant: 38.1 s (ref 0.0–51.9)

## 2019-12-07 LAB — PROTHROMBIN GENE MUTATION

## 2019-12-07 LAB — DRVVT MIX: dRVVT Mix: 45.7 s — ABNORMAL HIGH (ref 0.0–40.4)

## 2019-12-09 LAB — PNH PROFILE (-HIGH SENSITIVITY)

## 2019-12-10 ENCOUNTER — Encounter (HOSPITAL_COMMUNITY): Payer: Self-pay | Admitting: Emergency Medicine

## 2019-12-10 ENCOUNTER — Other Ambulatory Visit: Payer: Self-pay

## 2019-12-10 DIAGNOSIS — I81 Portal vein thrombosis: Secondary | ICD-10-CM | POA: Diagnosis not present

## 2019-12-10 DIAGNOSIS — Z7901 Long term (current) use of anticoagulants: Secondary | ICD-10-CM

## 2019-12-10 DIAGNOSIS — T82898A Other specified complication of vascular prosthetic devices, implants and grafts, initial encounter: Principal | ICD-10-CM | POA: Diagnosis present

## 2019-12-10 DIAGNOSIS — Z79899 Other long term (current) drug therapy: Secondary | ICD-10-CM

## 2019-12-10 DIAGNOSIS — K7460 Unspecified cirrhosis of liver: Secondary | ICD-10-CM | POA: Diagnosis present

## 2019-12-10 DIAGNOSIS — R7989 Other specified abnormal findings of blood chemistry: Secondary | ICD-10-CM | POA: Diagnosis present

## 2019-12-10 DIAGNOSIS — Y838 Other surgical procedures as the cause of abnormal reaction of the patient, or of later complication, without mention of misadventure at the time of the procedure: Secondary | ICD-10-CM | POA: Diagnosis present

## 2019-12-10 DIAGNOSIS — Z86718 Personal history of other venous thrombosis and embolism: Secondary | ICD-10-CM

## 2019-12-10 DIAGNOSIS — R760 Raised antibody titer: Secondary | ICD-10-CM | POA: Diagnosis present

## 2019-12-10 DIAGNOSIS — R748 Abnormal levels of other serum enzymes: Secondary | ICD-10-CM | POA: Diagnosis present

## 2019-12-10 DIAGNOSIS — K760 Fatty (change of) liver, not elsewhere classified: Secondary | ICD-10-CM | POA: Diagnosis present

## 2019-12-10 LAB — CBC
HCT: 35.7 % — ABNORMAL LOW (ref 39.0–52.0)
Hemoglobin: 11.4 g/dL — ABNORMAL LOW (ref 13.0–17.0)
MCH: 26.9 pg (ref 26.0–34.0)
MCHC: 31.9 g/dL (ref 30.0–36.0)
MCV: 84.2 fL (ref 80.0–100.0)
Platelets: 256 10*3/uL (ref 150–400)
RBC: 4.24 MIL/uL (ref 4.22–5.81)
RDW: 16.1 % — ABNORMAL HIGH (ref 11.5–15.5)
WBC: 4.1 10*3/uL (ref 4.0–10.5)
nRBC: 0 % (ref 0.0–0.2)

## 2019-12-10 LAB — COMPREHENSIVE METABOLIC PANEL
ALT: 71 U/L — ABNORMAL HIGH (ref 0–44)
AST: 62 U/L — ABNORMAL HIGH (ref 15–41)
Albumin: 3.7 g/dL (ref 3.5–5.0)
Alkaline Phosphatase: 92 U/L (ref 38–126)
Anion gap: 8 (ref 5–15)
BUN: 15 mg/dL (ref 8–23)
CO2: 23 mmol/L (ref 22–32)
Calcium: 9.5 mg/dL (ref 8.9–10.3)
Chloride: 111 mmol/L (ref 98–111)
Creatinine, Ser: 1.04 mg/dL (ref 0.61–1.24)
GFR, Estimated: >60 mL/min (ref >60)
Glucose, Bld: 113 mg/dL — ABNORMAL HIGH (ref 70–99)
Potassium: 4.2 mmol/L (ref 3.5–5.1)
Sodium: 142 mmol/L (ref 135–145)
Total Bilirubin: 0.9 mg/dL (ref 0.3–1.2)
Total Protein: 7.2 g/dL (ref 6.5–8.1)

## 2019-12-10 LAB — LIPASE, BLOOD: Lipase: 91 U/L — ABNORMAL HIGH (ref 11–51)

## 2019-12-10 NOTE — ED Triage Notes (Addendum)
Pt [facility] from home complaining of epigastric squeezing pain. Hx of blood clots in his portal vein and he states this feels the same. He takes Eliquis. Denies N/V/D. 12 lead unremarkable with EMS.

## 2019-12-11 ENCOUNTER — Other Ambulatory Visit: Payer: Self-pay

## 2019-12-11 ENCOUNTER — Emergency Department (HOSPITAL_COMMUNITY): Payer: BC Managed Care – PPO

## 2019-12-11 ENCOUNTER — Inpatient Hospital Stay (HOSPITAL_COMMUNITY): Payer: BC Managed Care – PPO

## 2019-12-11 ENCOUNTER — Encounter (HOSPITAL_COMMUNITY): Payer: Self-pay | Admitting: Emergency Medicine

## 2019-12-11 ENCOUNTER — Inpatient Hospital Stay (HOSPITAL_COMMUNITY)
Admission: EM | Admit: 2019-12-11 | Discharge: 2019-12-12 | DRG: 314 | Disposition: A | Discharge status: Home / Self Care | Payer: BC Managed Care – PPO | Attending: Internal Medicine | Admitting: Internal Medicine

## 2019-12-11 DIAGNOSIS — Z79899 Other long term (current) drug therapy: Secondary | ICD-10-CM | POA: Diagnosis not present

## 2019-12-11 DIAGNOSIS — R7989 Other specified abnormal findings of blood chemistry: Secondary | ICD-10-CM

## 2019-12-11 DIAGNOSIS — R748 Abnormal levels of other serum enzymes: Secondary | ICD-10-CM

## 2019-12-11 DIAGNOSIS — I81 Portal vein thrombosis: Secondary | ICD-10-CM | POA: Diagnosis present

## 2019-12-11 DIAGNOSIS — Z7901 Long term (current) use of anticoagulants: Secondary | ICD-10-CM | POA: Diagnosis not present

## 2019-12-11 DIAGNOSIS — Z86718 Personal history of other venous thrombosis and embolism: Secondary | ICD-10-CM | POA: Diagnosis not present

## 2019-12-11 DIAGNOSIS — R760 Raised antibody titer: Secondary | ICD-10-CM | POA: Diagnosis present

## 2019-12-11 DIAGNOSIS — K7460 Unspecified cirrhosis of liver: Secondary | ICD-10-CM | POA: Diagnosis present

## 2019-12-11 DIAGNOSIS — T82898A Other specified complication of vascular prosthetic devices, implants and grafts, initial encounter: Secondary | ICD-10-CM | POA: Diagnosis present

## 2019-12-11 DIAGNOSIS — Z95828 Presence of other vascular implants and grafts: Secondary | ICD-10-CM

## 2019-12-11 DIAGNOSIS — Y838 Other surgical procedures as the cause of abnormal reaction of the patient, or of later complication, without mention of misadventure at the time of the procedure: Secondary | ICD-10-CM | POA: Diagnosis present

## 2019-12-11 DIAGNOSIS — K760 Fatty (change of) liver, not elsewhere classified: Secondary | ICD-10-CM | POA: Diagnosis present

## 2019-12-11 DIAGNOSIS — I8290 Acute embolism and thrombosis of unspecified vein: Secondary | ICD-10-CM | POA: Insufficient documentation

## 2019-12-11 DIAGNOSIS — R1013 Epigastric pain: Secondary | ICD-10-CM

## 2019-12-11 HISTORY — DX: Portal vein thrombosis: I81

## 2019-12-11 LAB — PROTIME-INR
INR: 1.2 (ref 0.8–1.2)
Prothrombin Time: 15.1 seconds (ref 11.4–15.2)

## 2019-12-11 LAB — HEPATITIS B SURFACE ANTIBODY,QUALITATIVE: Hep B S Ab: NONREACTIVE

## 2019-12-11 LAB — HEPATITIS B SURFACE ANTIGEN: Hepatitis B Surface Ag: NONREACTIVE

## 2019-12-11 LAB — URINALYSIS, ROUTINE W REFLEX MICROSCOPIC
Bilirubin Urine: NEGATIVE
Glucose, UA: NEGATIVE mg/dL
Hgb urine dipstick: NEGATIVE
Ketones, ur: NEGATIVE mg/dL
Leukocytes,Ua: NEGATIVE
Nitrite: NEGATIVE
Protein, ur: NEGATIVE mg/dL
Specific Gravity, Urine: >1.046 — ABNORMAL HIGH (ref 1.005–1.030)
pH: 5 (ref 5.0–8.0)

## 2019-12-11 LAB — HEPARIN LEVEL (UNFRACTIONATED): Heparin Unfractionated: >2.2 IU/mL — ABNORMAL HIGH (ref 0.30–0.70)

## 2019-12-11 LAB — APTT
aPTT: 32 seconds (ref 24–36)
aPTT: 90 seconds — ABNORMAL HIGH (ref 24–36)
aPTT: 109 seconds — ABNORMAL HIGH (ref 24–36)

## 2019-12-11 LAB — HEPATITIS C ANTIBODY: HCV Ab: NONREACTIVE

## 2019-12-11 LAB — HEPATITIS B CORE ANTIBODY, TOTAL: Hep B Core Total Ab: NONREACTIVE

## 2019-12-11 LAB — HIV ANTIBODY (ROUTINE TESTING W REFLEX): HIV Screen 4th Generation wRfx: NONREACTIVE

## 2019-12-11 MED ORDER — ACETAMINOPHEN 325 MG PO TABS: 650.0000 mg | ORAL_TABLET | Freq: Four times a day (QID) | ORAL | Status: DC | PRN

## 2019-12-11 MED ORDER — TRAMADOL HCL 50 MG PO TABS: 50.0000 mg | ORAL_TABLET | ORAL | Status: DC | PRN

## 2019-12-11 MED ORDER — HYDROMORPHONE HCL 1 MG/ML IJ SOLN
0.5000 mg | INTRAMUSCULAR | Status: DC | PRN
  Administered 2019-12-11: 0.5 mg via INTRAVENOUS
  Filled 2019-12-11: qty 0.5

## 2019-12-11 MED ORDER — GADOBUTROL 1 MMOL/ML IV SOLN
8.0000 mL | Freq: Once | INTRAVENOUS | Status: AC | PRN
  Administered 2019-12-11: 8 mL via INTRAVENOUS

## 2019-12-11 MED ORDER — METOPROLOL TARTRATE 5 MG/5ML IV SOLN: 5.0000 mg | Freq: Four times a day (QID) | INTRAVENOUS | Status: DC | PRN

## 2019-12-11 MED ORDER — HEPARIN (PORCINE) 25000 UT/250ML-% IV SOLN
1200.0000 [IU]/h | INTRAVENOUS | Status: DC
  Administered 2019-12-11: 1300 [IU]/h via INTRAVENOUS
  Administered 2019-12-12: 1200 [IU]/h via INTRAVENOUS
  Filled 2019-12-11 (×2): qty 250

## 2019-12-11 MED ORDER — SODIUM CHLORIDE 0.9% FLUSH
3.0000 mL | Freq: Two times a day (BID) | INTRAVENOUS | Status: DC
  Administered 2019-12-11 – 2019-12-12 (×2): 3 mL via INTRAVENOUS

## 2019-12-11 MED ORDER — LORAZEPAM 2 MG/ML IJ SOLN: 1.0000 mg | Freq: Once | INTRAMUSCULAR | Status: DC | PRN

## 2019-12-11 MED ORDER — SODIUM CHLORIDE 0.9 % IV BOLUS
1000.0000 mL | Freq: Once | INTRAVENOUS | Status: AC
  Administered 2019-12-11: 1000 mL via INTRAVENOUS

## 2019-12-11 MED ORDER — IOHEXOL 350 MG/ML SOLN
100.0000 mL | Freq: Once | INTRAVENOUS | Status: AC | PRN
  Administered 2019-12-11: 100 mL via INTRAVENOUS

## 2019-12-11 MED ORDER — LACTATED RINGERS IV SOLN: INTRAVENOUS | Status: AC

## 2019-12-11 MED ORDER — SODIUM CHLORIDE (PF) 0.9 % IJ SOLN
INTRAMUSCULAR | Status: AC
  Filled 2019-12-11: qty 50

## 2019-12-11 MED ORDER — ACETAMINOPHEN 650 MG RE SUPP: 650.0000 mg | Freq: Four times a day (QID) | RECTAL | Status: DC | PRN

## 2019-12-11 NOTE — ED Provider Notes (Addendum)
[facility] DEPT Provider Note: Georgena Spurling, MD, FACEP  CSN: [id number] MRN: [id number] ARRIVAL: 12/10/19 at 2144 ROOM: [facility]  Abdominal Pain   HISTORY OF PRESENT ILLNESS  12/11/19 12:08 AM Harry Lucero is a 64 y.o. male currently on Eliquis for portal vein thrombosis status post TIPS procedure in [place] 11/02/2019. He is here with 2 to 3 days of a squeezing epigastric pain. He rates the pain is a 7 out of 10 and states it feels like a recurrence of his portal vein thrombosis. He has had no associated nausea, vomiting or diarrhea.  Pain is somewhat worse with palpation or movement.   Past Medical History:  Diagnosis Date  . Portal vein thrombosis     Past Surgical History:  Procedure Laterality Date  . TIPS PROCEDURE      History reviewed. No pertinent family history.  Social History   Tobacco Use  . Smoking status: Never Smoker  . Smokeless tobacco: Never Used  Substance Use Topics  . Alcohol use: No  . Drug use: No    Prior to Admission medications   Medication Sig Start Date End Date Taking? Authorizing Provider  apixaban (ELIQUIS) 5 MG TABS tablet Take 1 tablet (5 mg total) by mouth 2 (two) times daily. 12/05/19  Yes [name], MD  traMADol (ULTRAM) 50 MG tablet Take 50 mg by mouth every 4 (four) hours as needed. 11/22/19  Yes [provider]  Vitamin D, Ergocalciferol, (DRISDOL) 1.25 MG (50000 UNIT) CAPS capsule Take 50,000 Units by mouth once a week. 11/22/19  Yes [provider]  ranitidine (ZANTAC) 150 MG tablet Take 1 tablet (150 mg total) by mouth 2 (two) times daily. Patient not taking: Reported on 12/11/2019 03/19/18 12/11/19  Muthersbaugh, Jarrett Soho, PA-C    Allergies Patient has no known allergies.   REVIEW OF SYSTEMS  Negative except as noted here or in the History of Present Illness.   PHYSICAL EXAMINATION  Initial Vital Signs Blood pressure (!) 146/101, pulse 91, temperature 98.2 F (36.8 C),  temperature source Oral, resp. rate 16, SpO2 99 %.  Examination General: Well-developed, well-nourished male in no acute distress; appearance consistent with age of record HENT: normocephalic; atraumatic Eyes: pupils equal, round and reactive to light; extraocular muscles intact Neck: supple Heart: regular rate and rhythm Lungs: clear to auscultation bilaterally Abdomen: soft; nondistended; epigastric tenderness; no masses or hepatosplenomegaly; bowel sounds present Extremities: No deformity; full range of motion; pulses normal Neurologic: Awake, alert and oriented; motor function intact in all extremities and symmetric; no facial droop Skin: Warm and dry Psychiatric: Normal mood and affect   RESULTS  Summary of this visit's results, reviewed and interpreted by myself:   EKG Interpretation  Date/Time:    Ventricular Rate:    PR Interval:    QRS Duration:   QT Interval:    QTC Calculation:   R Axis:     Text Interpretation:        Laboratory Studies: Results for orders placed or performed during the hospital encounter of 12/11/19 (from the past 24 hour(s))  Lipase, blood     Status: Abnormal   Collection Time: 12/10/19 10:05 PM  Result Value Ref Range   Lipase 91 (H) 11 - 51 U/L  Comprehensive metabolic panel     Status: Abnormal   Collection Time: 12/10/19 10:05 PM  Result Value Ref Range   Sodium 142 135 - 145 mmol/L   Potassium 4.2 3.5 - 5.1 mmol/L   Chloride  111 98 - 111 mmol/L   CO2 23 22 - 32 mmol/L   Glucose, Bld 113 (H) 70 - 99 mg/dL   BUN 15 8 - 23 mg/dL   Creatinine, Ser 1.04 0.61 - 1.24 mg/dL   Calcium 9.5 8.9 - 10.3 mg/dL   Total Protein 7.2 6.5 - 8.1 g/dL   Albumin 3.7 3.5 - 5.0 g/dL   AST 62 (H) 15 - 41 U/L   ALT 71 (H) 0 - 44 U/L   Alkaline Phosphatase 92 38 - 126 U/L   Total Bilirubin 0.9 0.3 - 1.2 mg/dL   GFR, Estimated >60 >60 mL/min   Anion gap 8 5 - 15  CBC     Status: Abnormal   Collection Time: 12/10/19 10:05 PM  Result Value Ref Range     WBC 4.1 4.0 - 10.5 K/uL   RBC 4.24 4.22 - 5.81 MIL/uL   Hemoglobin 11.4 (L) 13.0 - 17.0 g/dL   HCT 35.7 (L) 39 - 52 %   MCV 84.2 80.0 - 100.0 fL   MCH 26.9 26.0 - 34.0 pg   MCHC 31.9 30.0 - 36.0 g/dL   RDW 16.1 (H) 11.5 - 15.5 %   Platelets 256 150 - 400 K/uL   nRBC 0.0 0.0 - 0.2 %  Urinalysis, Routine w reflex microscopic     Status: Abnormal   Collection Time: 12/10/19 10:05 PM  Result Value Ref Range   Color, Urine STRAW (A) YELLOW   APPearance CLEAR CLEAR   Specific Gravity, Urine >1.046 (H) 1.005 - 1.030   pH 5.0 5.0 - 8.0   Glucose, UA NEGATIVE NEGATIVE mg/dL   Hgb urine dipstick NEGATIVE NEGATIVE   Bilirubin Urine NEGATIVE NEGATIVE   Ketones, ur NEGATIVE NEGATIVE mg/dL   Protein, ur NEGATIVE NEGATIVE mg/dL   Nitrite NEGATIVE NEGATIVE   Leukocytes,Ua NEGATIVE NEGATIVE   Imaging Studies: CT Angio Abd/Pel W and/or Wo Contrast  Result Date: 12/11/2019 CLINICAL DATA:  64 year old male with history of portal vein thrombosis presenting with epigastric pain. EXAM: CTA ABDOMEN AND PELVIS WITHOUT AND WITH CONTRAST TECHNIQUE: Multidetector CT imaging of the abdomen and pelvis was performed using the standard protocol during bolus administration of intravenous contrast. Multiplanar reconstructed images and MIPs were obtained and reviewed to evaluate the vascular anatomy. CONTRAST:  155mL OMNIPAQUE IOHEXOL 350 MG/ML SOLN COMPARISON:  None. FINDINGS: VASCULAR Aorta: Normal caliber aorta without aneurysm, dissection, vasculitis or significant stenosis. Celiac: Patent without evidence of aneurysm, dissection, vasculitis or significant stenosis. SMA: Patent without evidence of aneurysm, dissection, vasculitis or significant stenosis. Renals: Both renal arteries are patent without evidence of aneurysm, dissection, vasculitis, fibromuscular dysplasia or significant stenosis. IMA: Patent without evidence of aneurysm, dissection, vasculitis or significant stenosis. Inflow: Patent without evidence  of aneurysm, dissection, vasculitis or significant stenosis. Proximal Outflow: Bilateral common femoral and visualized portions of the superficial and profunda femoral arteries are patent without evidence of aneurysm, dissection, vasculitis or significant stenosis. Veins: The IVC is unremarkable. There is a TIPS in place. Evaluation of the TIPS is limited on this CT. There is however non opacification of the TIPS most consistent with provided history of shunt occlusion. Further evaluation of the TIPS with duplex ultrasound recommended. Review of the MIP images confirms the above findings. NON-VASCULAR Lower chest: The visualized lung bases are clear. No intra-abdominal free air or free fluid. Hepatobiliary: The liver is unremarkable. No intrahepatic biliary dilatation. The gallbladder is unremarkable. Pancreas: There is mild dilatation of the main pancreatic duct measuring up to  6 mm in diameter. There is ill-defined fullness of the region of the head and uncinate process of the pancreas concerning for a pancreatic mass. Further characterization with MRI without and with contrast on a nonemergent/outpatient basis recommended. Spleen: Normal in size without focal abnormality. Adrenals/Urinary Tract: The adrenal glands unremarkable. There is no hydronephrosis on either side. There is symmetric enhancement and excretion of contrast by both kidneys. The visualized ureters appear unremarkable. The urinary bladder is predominantly collapsed. Stomach/Bowel: There is sigmoid diverticulosis and scattered colonic diverticula without active inflammatory changes. There is no bowel obstruction or active inflammation. The appendix is normal. Lymphatic: No adenopathy. There is mild haziness of the mesenteric fat which may represent infiltration or mild mesenteric panniculitis. Reproductive: The prostate gland is enlarged measuring approximately 6 cm in transverse axial diameter. Other: None Musculoskeletal: No acute or significant  osseous findings. IMPRESSION: 1. Occluded appearance of the TIPS. Further evaluation of the TIPS with duplex ultrasound recommended. 2. Findings concerning for a mass in the region of the head/uncinate process of the pancreas. Further evaluation with MRI without and with contrast on a nonemergent/outpatient basis recommended. 3. Mild mesenteric stranding may represent panniculitis. Clinical correlation is recommended. 4. Colonic diverticulosis. No bowel obstruction. Normal appendix. 5. Enlarged prostate gland. Electronically Signed   By: Anner Crete M.D.   On: 12/11/2019 01:42    ED COURSE and MDM  Nursing notes, initial and subsequent vitals signs, including pulse oximetry, reviewed and interpreted by myself.  Vitals:   12/11/19 0230 12/11/19 0400 12/11/19 0500 12/11/19 0630  BP: (!) 143/105 (!) 156/105 (!) 137/99 (!) 147/101  Pulse: 65 64 (!) 59 (!) 57  Resp: 20 20 20 18   Temp:      TempSrc:      SpO2: 100% 100% 97% 99%   Medications  sodium chloride (PF) 0.9 % injection (  Given 12/11/19 0131)  iohexol (OMNIPAQUE) 350 MG/ML injection 100 mL (100 mLs Intravenous Contrast Given 12/11/19 0103)  sodium chloride 0.9 % bolus 1,000 mL (1,000 mLs Intravenous New Bag/Given 12/11/19 0307)   7:19 AM Awaiting results of hepatic Doppler study. Signed out to Dr. Tomi Bamberger.   PROCEDURES  Procedures   ED DIAGNOSES     ICD-10-CM   1. Status post transjugular intrahepatic portosystemic shunt  Z95.828   2. Portal vein thrombosis  I81 US LIVER DOPPLER    US LIVER DOPPLER  3. Epigastric pain  R10.13        Arick Mareno, MD 12/11/19 [id number]    Shanon Rosser, MD 12/11/19 [phone]

## 2019-12-11 NOTE — Progress Notes (Signed)
Patient arrived to unit from ED with complaint of pain in abdomen 9/10 Rt/Lt upper quadrants. Discussed preventions or pain when patient stated "I just took my own." Medication policy reviewed. Spouse instructed to take the patients medications home.

## 2019-12-11 NOTE — Consult Note (Signed)
[place] [phone]   [phone]  INITIAL CONSULT NOTE  Patient Care Team: System, Provider Not In as PCP - General   CHIEF COMPLAINTS/PURPOSE OF CONSULTATION:  "Occlusion on TIPS procedure in setting of symptomatic port vein thrombis "  HISTORY OF PRESENTING ILLNESS:  Harry Lucero 64 y.o. male with medical history significant for a port vein thrombus s/p TIPS procedure who presents with worsening abdominal pain and was found to have occlusion on the TIPS.    On review of the previous records the patient was last seen by Dr. Lindi Adie on 12/05/2019 in the outpatient setting. In his note he details the patient's prior history, noting " He went to [facility] with the symptoms and underwent CT scans on 10/31/2019.  The scan showed occlusive thrombosis involving the main portal vein and superior mesenteric vein as well as the jejunal and ileal veins.  Nonocclusive thrombus of the splenic vein was also found.  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  He was seen by interventional radiology who performed TIPS procedure and portal vein and mesenteric vein thrombectomy.  They tried to do balloon maceration of the portal vein and SMV thrombus.  Transjugular lysis of portal and mesenteric vein were performed.  After all this is done he was discharged home on Eliquis."   On exam today Harry Lucero is accompanied by his wife. He reports that his abdomen has been persistently worsening pain since he had his TIPS procedure performed. He notes his pain never went down to undetectable levels since he has been diagnosed with the portal vein thrombus. He notes that the pain is like a "bad cramp" that is constant in severity. He notes that it rapidly worsened within the last 2 days and that is why he presented to the emergency department. He reports that he has had no issues with fevers, chills, sweats, nausea, vomiting or diarrhea. His appetite remains good. He has had no  issues with the Eliquis such as missed days or doses. He denies having any bleeding, bruising, or dark stools. A full 10 point ROS was otherwise unremarkable.  MEDICAL HISTORY:  Past Medical History:  Diagnosis Date  . Portal vein thrombosis     SURGICAL HISTORY: Past Surgical History:  Procedure Laterality Date  . TIPS PROCEDURE      SOCIAL HISTORY: Social History   Socioeconomic History  . Marital status: Married    Spouse name: Not on file  . Number of children: Not on file  . Years of education: Not on file  . Highest education level: Not on file  Occupational History  . Not on file  Tobacco Use  . Smoking status: Never Smoker  . Smokeless tobacco: Never Used  Substance and Sexual Activity  . Alcohol use: No  . Drug use: No  . Sexual activity: Not on file  Other Topics Concern  . Not on file  Social History Narrative  . Not on file   Social Determinants of Health   Financial Resource Strain:   . Difficulty of Paying Living Expenses: Not on file  Food Insecurity:   . Worried About Charity fundraiser in the Last Year: Not on file  . Ran Out of Food in the Last Year: Not on file  Transportation Needs:   . Lack of Transportation (Medical): Not on file  . Lack of Transportation (Non-Medical): Not on file  Physical Activity:   . Days of Exercise per Week: Not on file  .  Minutes of Exercise per Session: Not on file  Stress:   . Feeling of Stress : Not on file  Social Connections:   . Frequency of Communication with Friends and Family: Not on file  . Frequency of Social Gatherings with Friends and Family: Not on file  . Attends Religious Services: Not on file  . Active Member of Clubs or Organizations: Not on file  . Attends Archivist Meetings: Not on file  . Marital Status: Not on file  Intimate Partner Violence:   . Fear of Current or Ex-Partner: Not on file  . Emotionally Abused: Not on file  . Physically Abused: Not on file  . Sexually  Abused: Not on file    FAMILY HISTORY: History reviewed. No pertinent family history.  ALLERGIES:  has No Known Allergies.  MEDICATIONS:  Current Facility-Administered Medications  Medication Dose Route Frequency Provider Last Rate Last Admin  . heparin ADULT infusion 100 units/mL (25000 units/258mL sodium chloride 0.45%)  1,300 Units/hr Intravenous Continuous Pham, Anh P, RPH 13 mL/hr at 12/11/19 1012 1,300 Units/hr at 12/11/19 1012  . lactated ringers infusion   Intravenous Continuous Harold Hedge, MD 100 mL/hr at 12/11/19 1103 New Bag at 12/11/19 1103  . LORazepam (ATIVAN) injection 1 mg  1 mg Intravenous Once PRN Dorie Rank, MD       Current Outpatient Medications  Medication Sig Dispense Refill  . apixaban (ELIQUIS) 5 MG TABS tablet Take 1 tablet (5 mg total) by mouth 2 (two) times daily. 60 tablet   . traMADol (ULTRAM) 50 MG tablet Take 50 mg by mouth every 4 (four) hours as needed.    . Vitamin D, Ergocalciferol, (DRISDOL) 1.25 MG (50000 UNIT) CAPS capsule Take 50,000 Units by mouth once a week.      REVIEW OF SYSTEMS:   Constitutional: ( - ) fevers, ( - )  chills , ( - ) night sweats Eyes: ( - ) blurriness of vision, ( - ) double vision, ( - ) watery eyes Ears, nose, mouth, throat, and face: ( - ) mucositis, ( - ) sore throat Respiratory: ( - ) cough, ( - ) dyspnea, ( - ) wheezes Cardiovascular: ( - ) palpitation, ( - ) chest discomfort, ( - ) lower extremity swelling Gastrointestinal:  ( - ) nausea, ( - ) heartburn, ( - ) change in bowel habits Skin: ( - ) abnormal skin rashes Lymphatics: ( - ) new lymphadenopathy, ( - ) easy bruising Neurological: ( - ) numbness, ( - ) tingling, ( - ) new weaknesses Behavioral/Psych: ( - ) mood change, ( - ) new changes  All other systems were reviewed with the patient and are negative.  PHYSICAL EXAMINATION:  Vitals:   12/11/19 1104 12/11/19 1230  BP: (!) 138/102 (!) 140/94  Pulse: (!) 59 60  Resp: 20 18  Temp:    SpO2: 100%  100%   There were no vitals filed for this visit.  GENERAL: well appearing middle aged African American male in NAD  SKIN: skin color, texture, turgor are normal, no rashes or significant lesions EYES: conjunctiva are pink and non-injected, sclera clear LUNGS: clear to auscultation and percussion with normal breathing effort HEART: regular rate & rhythm and no murmurs and no lower extremity edema ABDOMEN: soft, non-distended, normal bowel sounds. Mild tenderness in RUQ Musculoskeletal: no cyanosis of digits and no clubbing  PSYCH: alert & oriented x 3, fluent speech NEURO: no focal motor/sensory deficits  LABORATORY DATA:  I have  reviewed the data as listed CBC Latest Ref Rng & Units 12/10/2019 03/18/2018 06/30/2011  WBC 4.0 - 10.5 K/uL 4.1 4.4 4.7  Hemoglobin 13.0 - 17.0 g/dL 11.4(L) 13.9 13.1  Hematocrit 39 - 52 % 35.7(L) 42.2 37.9(L)  Platelets 150 - 400 K/uL 256 290 316    CMP Latest Ref Rng & Units 12/10/2019 03/18/2018 06/30/2011  Glucose 70 - 99 mg/dL 113(H) 102(H) 93  BUN 8 - 23 mg/dL 15 13 17   Creatinine 0.61 - 1.24 mg/dL 1.04 1.20 1.06  Sodium 135 - 145 mmol/L 142 139 136  Potassium 3.5 - 5.1 mmol/L 4.2 4.1 4.2  Chloride 98 - 111 mmol/L 111 106 102  CO2 22 - 32 mmol/L 23 25 24   Calcium 8.9 - 10.3 mg/dL 9.5 9.6 9.4  Total Protein 6.5 - 8.1 g/dL 7.2 7.2 -  Total Bilirubin 0.3 - 1.2 mg/dL 0.9 0.9 -  Alkaline Phos 38 - 126 U/L 92 60 -  AST 15 - 41 U/L 62(H) 34 -  ALT 0 - 44 U/L 71(H) 54(H) -    RADIOGRAPHIC STUDIES: MR Abdomen W or Wo Contrast  Result Date: 12/11/2019 CLINICAL DATA:  Suspected occluded TIPS, evaluate portal vein thrombosis EXAM: MRI ABDOMEN WITHOUT AND WITH CONTRAST TECHNIQUE: Multiplanar multisequence MR imaging of the abdomen was performed both before and after the administration of intravenous contrast. CONTRAST:  4mL GADAVIST GADOBUTROL 1 MMOL/ML IV SOLN COMPARISON:  Hepatic Doppler dated 12/11/2019. CTA abdomen/pelvis dated 12/11/2019. FINDINGS: Motion  degraded images. Lower chest: Lung bases are clear. Hepatobiliary: Suspected cirrhotic configuration with enlargement of the caudate. No suspicious/enhancing hepatic lesions. No hepatic steatosis. Gallbladder is unremarkable. No intrahepatic or extrahepatic ductal dilatation. Pancreas: Mildly prominent pancreatic duct with suspected pancreatic divisum, possibly reflecting sequela of prior/chronic pancreatitis. No pancreatic mass, atrophy, or peripancreatic inflammatory changes. Spleen:  Within normal limits. Adrenals/Urinary Tract:  Adrenal glands are within normal limits. Kidneys are within normal limits.  No hydronephrosis. Stomach/Bowel: Stomach and visualized bowel are unremarkable. Vascular/Lymphatic: Status post TIPS with occlusion. Occluded portal vein with collaterals in the porta hepatis (series 21/image 41). SMV remains patent (series 21/image 58). No suspicious abdominal lymphadenopathy. Other:  No abdominal ascites. Musculoskeletal: No focal osseous lesions. IMPRESSION: Motion degraded images. Status post TIPS with occlusion. Occluded portal vein with collaterals in the porta hepatis. SMV remains patent. Cirrhosis. No suspicious/enhancing hepatic lesions. No abdominal ascites. Spleen is normal in size. Suspected pancreatic divisum with mild prominence of the main (dorsal) pancreatic duct, possibly reflecting sequela of prior/chronic pancreatitis. No pancreatic mass is evident on MR. Electronically Signed   By: Julian Hy M.D.   On: 12/11/2019 11:28   US LIVER DOPPLER  Result Date: 12/11/2019 CLINICAL DATA:  64 year old male with history of portal vein thrombus status post tips creation in September 2021 in [place]. Presents to the emergency department with epigastric pain similar to prior portal occlusion. Concern for tip thrombosis on recent CT. EXAM: DUPLEX ULTRASOUND OF LIVER AND TIPS SHUNT TECHNIQUE: Color and duplex Doppler ultrasound was performed to evaluate the hepatic in-flow  and out-flow vessels. COMPARISON:  CT abdomen pelvis from the same day FINDINGS: Portal Vein Velocities Main: Not visualized Right:  32.8 cm/sec Left: Not visualized TIPS Stent Velocities Proximal:  0 cm/sec Mid:  0 Distal:  0 cm/sec Hepatic Vein Velocities Right:  36 cm/sec Mid:  14 cm/sec Left:  15 cm/sec Splenic Vein: Patent and normal caliber with antegrade flow measuring up to 29 centimeters/second. Superior Mesenteric Vein: Not visualized. Hepatic Artery: Patent with  flow measuring up to 42.4 centimeters/second IVC: Present and patent with normal respiratory phasicity. Spleen: 7.6 cm x 9.4 cm x 8.8 cm with a total volume of 326.2 cm^3 (411 cm^3 is upper limit normal) Portal Vein Occlusion/Thrombus: Yes Splenic Vein Occlusion/Thrombus: No Ascites: None Varices: None IMPRESSION: Complete occlusion of the indwelling TIPS stent graft. Incomplete sonographic evaluation of the main portal vein, however patency with antegrade flow in the right portal vein is suggestive of at least partial main portal vein patency. RECOMMENDATIONS: Consider multiphase MRI abdomen for further characterization of the extent of portal thrombus. Consider Interventional Radiology, Hematology, and Gastroenterology consultations for evaluation for potential TIPS recanalization. These results were called by telephone at the time of interpretation on 12/11/2019 at 8:50 am to provider Dorie Rank, MD , who verbally acknowledged these results. Ruthann Cancer, MD [facility] Radiology Electronically Signed   By: Ruthann Cancer MD   On: 12/11/2019 09:00   CT Angio Abd/Pel W and/or Wo Contrast  Result Date: 12/11/2019 CLINICAL DATA:  64 year old male with history of portal vein thrombosis presenting with epigastric pain. EXAM: CTA ABDOMEN AND PELVIS WITHOUT AND WITH CONTRAST TECHNIQUE: Multidetector CT imaging of the abdomen and pelvis was performed using the standard protocol during bolus  administration of intravenous contrast. Multiplanar reconstructed images and MIPs were obtained and reviewed to evaluate the vascular anatomy. CONTRAST:  144mL OMNIPAQUE IOHEXOL 350 MG/ML SOLN COMPARISON:  None. FINDINGS: VASCULAR Aorta: Normal caliber aorta without aneurysm, dissection, vasculitis or significant stenosis. Celiac: Patent without evidence of aneurysm, dissection, vasculitis or significant stenosis. SMA: Patent without evidence of aneurysm, dissection, vasculitis or significant stenosis. Renals: Both renal arteries are patent without evidence of aneurysm, dissection, vasculitis, fibromuscular dysplasia or significant stenosis. IMA: Patent without evidence of aneurysm, dissection, vasculitis or significant stenosis. Inflow: Patent without evidence of aneurysm, dissection, vasculitis or significant stenosis. Proximal Outflow: Bilateral common femoral and visualized portions of the superficial and profunda femoral arteries are patent without evidence of aneurysm, dissection, vasculitis or significant stenosis. Veins: The IVC is unremarkable. There is a TIPS in place. Evaluation of the TIPS is limited on this CT. There is however non opacification of the TIPS most consistent with provided history of shunt occlusion. Further evaluation of the TIPS with duplex ultrasound recommended. Review of the MIP images confirms the above findings. NON-VASCULAR Lower chest: The visualized lung bases are clear. No intra-abdominal free air or free fluid. Hepatobiliary: The liver is unremarkable. No intrahepatic biliary dilatation. The gallbladder is unremarkable. Pancreas: There is mild dilatation of the main pancreatic duct measuring up to 6 mm in diameter. There is ill-defined fullness of the region of the head and uncinate process of the pancreas concerning for a pancreatic mass. Further characterization with MRI without and with contrast on a nonemergent/outpatient basis recommended. Spleen: Normal in size without  focal abnormality. Adrenals/Urinary Tract: The adrenal glands unremarkable. There is no hydronephrosis on either side. There is symmetric enhancement and excretion of contrast by both kidneys. The visualized ureters appear unremarkable. The urinary bladder is predominantly collapsed. Stomach/Bowel: There is sigmoid diverticulosis and scattered colonic diverticula without active inflammatory changes. There is no bowel obstruction or active inflammation. The appendix is normal. Lymphatic: No adenopathy. There is mild haziness of the mesenteric fat which may represent infiltration or mild mesenteric panniculitis. Reproductive: The prostate gland is enlarged measuring approximately 6 cm in transverse axial diameter. Other: None Musculoskeletal: No acute or significant osseous findings. IMPRESSION: 1. Occluded appearance of the TIPS. Further evaluation  of the TIPS with duplex ultrasound recommended. 2. Findings concerning for a mass in the region of the head/uncinate process of the pancreas. Further evaluation with MRI without and with contrast on a nonemergent/outpatient basis recommended. 3. Mild mesenteric stranding may represent panniculitis. Clinical correlation is recommended. 4. Colonic diverticulosis. No bowel obstruction. Normal appendix. 5. Enlarged prostate gland. Electronically Signed   By: Anner Crete M.D.   On: 12/11/2019 01:42    ASSESSMENT & PLAN Harry Lucero 64 y.o. male with medical history significant for a port vein thrombus s/p TIPS procedure who presents with worsening abdominal pain and was found to have occlusion on the TIPS.    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 Given the failure of Eliquis to prevent this thrombosis I  would recommend a different anticoagulation.  --Given his positive APS antibodies it may be worth considering therapy with coumadin after a lovenox bridge. Will defer this decision to the patient's primary hematologist.  --repeat APS antibodies in 3 months time (Jan 2022). 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 --cirrhotic morphology noted on MRI today, no previously noted on imaging (notably 10/31/2019 CT scan reported hepatic steatosis with smooth contour of the liver). Consider cirrhosis workup to include Hepatitis B and C serologies. Can make outpatient referral to GI.  --Hematology will continue to follow.   All questions were answered. The patient knows to call the clinic with any problems, questions or concerns.  A total of more than 50 minutes were spent on this encounter and over half of that time was spent on counseling and coordination of care as outlined above.   Ledell Peoples, MD Department of Hematology/Oncology [place] [facility] Phone: [phone] Pager: [phone] Email: [email or web address]  12/11/2019 1:31 PM

## 2019-12-11 NOTE — ED Provider Notes (Signed)
Patient initially seen by Dr. Florina Ou.  Please see his note.  Patient has been having difficulty with a portal vein thrombosis.  Patient recently had a procedure and is in the ED with squeezing abdominal pain that is similar to prior portal vein thrombosis recurrences.  Patient's imaging tests do confirm recurrent portal vein thrombosis.  Discussed the case with radiology.  Recommendation is for an MRI to help determine planning of the procedure.  I will consult with the medical service and oncology to arrange for admission and treatment.  Discussed case with Dr Lorenso Courier.  Would convert to heparin and then either lovenox or coumadin, NOAC failure.  He will also let Dr Lindi Adie know.  Dicsussed with Dr Neysa Bonito regarding admission.  Pt informed of results.   Dorie Rank, MD 12/11/19 [phone]

## 2019-12-11 NOTE — Progress Notes (Signed)
[place] for heparin Indication: portal vein thrombosis (home eliquis on hold)   No Known Allergies  Patient Measurements:  Height: 5\' 8"  (172.7 cm) Weight: 86.9 kg (191 lb 9.3 oz) IBW/kg (Calculated) : 68.4 Heparin Dosing Weight: 86 kg  Vital Signs: Temp: 98.3 F (36.8 C) (10/17 1636) Temp Source: Oral (10/17 1636) BP: 151/95 (10/17 1636) Pulse Rate: 55 (10/17 1636)  Labs: Recent Labs    12/10/19 2205 12/11/19 0937 12/11/19 1540  HGB 11.4*  --   --   HCT 35.7*  --   --   PLT 256  --   --   APTT  --  32 90*  LABPROT  --  15.1  --   INR  --  1.2  --   CREATININE 1.04  --   --     Estimated Creatinine Clearance: 76.9 mL/min (by C-G formula based on SCr of 1.04 mg/dL).   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 Heme/onc suspects DOAC failure. Pharmacy is consulted on 10/17 to transition to heparin drip.    - 10/17 abd CT: Occluded appearance of the TIPS, Findings concerning for a mass in the region of the head/uncinate process of the pancreas. - 10/17 liver US: Complete occlusion of the indwelling TIPS stent graft. Incomplete sonographic evaluation of the main portal vein, however patency with antegrade flow in the right portal vein is suggestive of at least partial main portal vein patency.  12/11/2019 5:01 PM  APTT = 90 seconds is therapeutic  No bleeding or complications with infusion reported  Goal of Therapy:  Heparin level 0.3-0.7 units/ml aPTT 66-102 seconds Monitor platelets by anticoagulation protocol: Yes   Plan:  - Continue IV heparin at 1300 units/hr - Check aPTT in 6hrs to verify therapeutic - Monitor daily CBC, signs/symptoms of bleeding  Peggyann Juba, PharmD, BCPS Pharmacy: [phone] 12/11/2019,4:56  PM

## 2019-12-11 NOTE — Plan of Care (Signed)
Patient hand book provided. Pt belongings call bell, placed within reach. Pt will call for assistance when needing to get out of bed.

## 2019-12-11 NOTE — H&P (Signed)
History and Physical        Hospital Admission Note Date: 12/11/2019  Patient name: Harry Lucero Medical record number: [id number] Date of birth: 1955/05/29 Age: 64 y.o. Gender: male  PCP: System, Provider Not In  Patient coming from: Home Lives with: Wife At baseline, ambulates: Independently  Chief Complaint    Chief Complaint  Patient presents with  . Abdominal Pain      HPI:   This is a 64 year old male with past medical history of portal vein thrombosis s/p TIPS procedure in [place] on 11/02/2019 currently on Eliquis and follows with Dr. Lindi Adie, hematology/oncology, who presented to the ED with squeezing epigastric pain since his procedure which has acutely worsened over the past 2 to 3 days, rated 7/10 and felt this was a recurrence of his portal vein thrombosis.  Currently with some epigastric pain.  Denied any nausea, vomiting, diarrhea.  Does have some discomfort with BMs.  Denies tobacco or alcohol use and no family history or other personal history of clotting or bleeding disorders.  According to Dr. Geralyn Flash recent hematology note on 12/05/2019: Patient was having increased abdominal pain x1 month and went to [facility] in [place]. CT scan on 9/6 showed an occlusive thrombosis involving the main portal vein, superior mesenteric vein as well as the jejunal and ileal veins with nonocclusive thrombus of the splenic vein. 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 He was seen by IR who performed a TIPS procedure and portal vein and mesenteric vein thrombectomy and attempted a balloon maceration of the portal vein and SMV thrombosis. Transjugular lysis of portal and mesenteric veins was performed and he was discharged with Eliquis. At his hematology visit he continued to have 7/10 pain and difficulty with eating. A hypercoagulability panel, lupus anticoagulant and PNH  flow cytometry were ordered and he was advised to return in 1 week (scheduled for 10/17, tomorrow).  ED Course: Vital signs overall unremarkable the exception of slight bradycardia. Notable labs: Lipase 91, AST 62, ALT 71 and otherwise unremarkable. CTA abdomen pelvis: Occluded appearance of the TIPS and findings concerning for a mass in the region of the head/uncinate process of the pancreas as well as mild mesenteric stranding possibly representing panniculitis -duplex ultrasound and MRI of the pancreas recommended. Liver Doppler US notable for: Complete occlusion of the indwelling TIPS stent graft-recommended MRI of the abdomen and IR, hematology and GI consultations.   Vitals:   12/11/19 0800 12/11/19 0900  BP: (!) 147/98 (!) 134/94  Pulse: (!) 58 (!) 56  Resp: 20 20  Temp:    SpO2: 98% 98%     Review of Systems:  Review of Systems  Constitutional: Negative for chills and fever.  Respiratory: Negative for cough and shortness of breath.   Cardiovascular: Negative for claudication and leg swelling.  Gastrointestinal: Positive for abdominal pain. Negative for blood in stool, nausea and vomiting.  Genitourinary: Negative.   Musculoskeletal: Negative.   Neurological: Negative.   All other systems reviewed and are negative.   Medical/Social/Family History   Past Medical History: Past Medical History:  Diagnosis Date  . Portal vein thrombosis     Past Surgical History:  Procedure Laterality Date  . TIPS PROCEDURE  Medications: Prior to Admission medications   Medication Sig Start Date End Date Taking? Authorizing Provider  apixaban (ELIQUIS) 5 MG TABS tablet Take 1 tablet (5 mg total) by mouth 2 (two) times daily. 12/05/19  Yes [name], MD  traMADol (ULTRAM) 50 MG tablet Take 50 mg by mouth every 4 (four) hours as needed. 11/22/19  Yes [provider]  Vitamin D, Ergocalciferol, (DRISDOL) 1.25 MG (50000 UNIT) CAPS capsule Take 50,000 Units by mouth once a  week. 11/22/19  Yes [provider]  ranitidine (ZANTAC) 150 MG tablet Take 1 tablet (150 mg total) by mouth 2 (two) times daily. Patient not taking: Reported on 12/11/2019 03/19/18 12/11/19  Muthersbaugh, Jarrett Soho, PA-C    Allergies:  No Known Allergies  Social History:  reports that he has never smoked. He has never used smokeless tobacco. He reports that he does not drink alcohol and does not use drugs.  Family History: History reviewed. No pertinent family history.   Objective   Physical Exam: Blood pressure (!) 134/94, pulse (!) 56, temperature 98.2 F (36.8 C), temperature source Oral, resp. rate 20, SpO2 98 %.  Physical Exam Vitals and nursing note reviewed.  Constitutional:      Appearance: Normal appearance.  HENT:     Head: Normocephalic and atraumatic.  Eyes:     Conjunctiva/sclera: Conjunctivae normal.  Cardiovascular:     Rate and Rhythm: Normal rate and regular rhythm.  Pulmonary:     Effort: Pulmonary effort is normal.     Breath sounds: Normal breath sounds.  Abdominal:     General: Abdomen is flat.     Palpations: Abdomen is soft.     Tenderness: There is abdominal tenderness in the epigastric area.  Musculoskeletal:        General: No swelling or tenderness.  Skin:    Coloration: Skin is not jaundiced or pale.  Neurological:     Mental Status: He is alert. Mental status is at baseline.  Psychiatric:        Mood and Affect: Mood normal.        Behavior: Behavior normal.     LABS on Admission: I have personally reviewed all the labs and imaging below    Basic Metabolic Panel: Recent Labs  Lab 12/10/19 2205  NA 142  K 4.2  CL 111  CO2 23  GLUCOSE 113*  BUN 15  CREATININE 1.04  CALCIUM 9.5   Liver Function Tests: Recent Labs  Lab 12/10/19 2205  AST 62*  ALT 71*  ALKPHOS 92  BILITOT 0.9  PROT 7.2  ALBUMIN 3.7   Recent Labs  Lab 12/10/19 2205  LIPASE 91*   No results for input(s): AMMONIA in the last 168  hours. CBC: Recent Labs  Lab 12/10/19 2205  WBC 4.1  HGB 11.4*  HCT 35.7*  MCV 84.2  PLT 256   Cardiac Enzymes: No results for input(s): CKTOTAL, CKMB, CKMBINDEX, TROPONINI in the last 168 hours. BNP: Invalid input(s): POCBNP CBG: No results for input(s): GLUCAP in the last 168 hours.  Radiological Exams on Admission:  US LIVER DOPPLER  Result Date: 12/11/2019 CLINICAL DATA:  64 year old male with history of portal vein thrombus status post tips creation in September 2021 in [place]. Presents to the emergency department with epigastric pain similar to prior portal occlusion. Concern for tip thrombosis on recent CT. EXAM: DUPLEX ULTRASOUND OF LIVER AND TIPS SHUNT TECHNIQUE: Color and duplex Doppler ultrasound was performed to evaluate the hepatic in-flow and out-flow vessels. COMPARISON:  CT abdomen pelvis from the same day FINDINGS: Portal Vein Velocities Main: Not visualized Right:  32.8 cm/sec Left: Not visualized TIPS Stent Velocities Proximal:  0 cm/sec Mid:  0 Distal:  0 cm/sec Hepatic Vein Velocities Right:  36 cm/sec Mid:  14 cm/sec Left:  15 cm/sec Splenic Vein: Patent and normal caliber with antegrade flow measuring up to 29 centimeters/second. Superior Mesenteric Vein: Not visualized. Hepatic Artery: Patent with flow measuring up to 42.4 centimeters/second IVC: Present and patent with normal respiratory phasicity. Spleen: 7.6 cm x 9.4 cm x 8.8 cm with a total volume of 326.2 cm^3 (411 cm^3 is upper limit normal) Portal Vein Occlusion/Thrombus: Yes Splenic Vein Occlusion/Thrombus: No Ascites: None Varices: None IMPRESSION: Complete occlusion of the indwelling TIPS stent graft. Incomplete sonographic evaluation of the main portal vein, however patency with antegrade flow in the right portal vein is suggestive of at least partial main portal vein patency. RECOMMENDATIONS: Consider multiphase MRI abdomen for further characterization of the extent of portal thrombus. Consider  Interventional Radiology, Hematology, and Gastroenterology consultations for evaluation for potential TIPS recanalization. These results were called by telephone at the time of interpretation on 12/11/2019 at 8:50 am to provider Dorie Rank, MD , who verbally acknowledged these results. Ruthann Cancer, MD [facility] Radiology Electronically Signed   By: Ruthann Cancer MD   On: 12/11/2019 09:00   CT Angio Abd/Pel W and/or Wo Contrast  Result Date: 12/11/2019 CLINICAL DATA:  63 year old male with history of portal vein thrombosis presenting with epigastric pain. EXAM: CTA ABDOMEN AND PELVIS WITHOUT AND WITH CONTRAST TECHNIQUE: Multidetector CT imaging of the abdomen and pelvis was performed using the standard protocol during bolus administration of intravenous contrast. Multiplanar reconstructed images and MIPs were obtained and reviewed to evaluate the vascular anatomy. CONTRAST:  146mL OMNIPAQUE IOHEXOL 350 MG/ML SOLN COMPARISON:  None. FINDINGS: VASCULAR Aorta: Normal caliber aorta without aneurysm, dissection, vasculitis or significant stenosis. Celiac: Patent without evidence of aneurysm, dissection, vasculitis or significant stenosis. SMA: Patent without evidence of aneurysm, dissection, vasculitis or significant stenosis. Renals: Both renal arteries are patent without evidence of aneurysm, dissection, vasculitis, fibromuscular dysplasia or significant stenosis. IMA: Patent without evidence of aneurysm, dissection, vasculitis or significant stenosis. Inflow: Patent without evidence of aneurysm, dissection, vasculitis or significant stenosis. Proximal Outflow: Bilateral common femoral and visualized portions of the superficial and profunda femoral arteries are patent without evidence of aneurysm, dissection, vasculitis or significant stenosis. Veins: The IVC is unremarkable. There is a TIPS in place. Evaluation of the TIPS is limited on this CT. There is however  non opacification of the TIPS most consistent with provided history of shunt occlusion. Further evaluation of the TIPS with duplex ultrasound recommended. Review of the MIP images confirms the above findings. NON-VASCULAR Lower chest: The visualized lung bases are clear. No intra-abdominal free air or free fluid. Hepatobiliary: The liver is unremarkable. No intrahepatic biliary dilatation. The gallbladder is unremarkable. Pancreas: There is mild dilatation of the main pancreatic duct measuring up to 6 mm in diameter. There is ill-defined fullness of the region of the head and uncinate process of the pancreas concerning for a pancreatic mass. Further characterization with MRI without and with contrast on a nonemergent/outpatient basis recommended. Spleen: Normal in size without focal abnormality. Adrenals/Urinary Tract: The adrenal glands unremarkable. There is no hydronephrosis on either side. There is symmetric enhancement and excretion of contrast by both kidneys. The visualized ureters appear unremarkable. The urinary bladder is predominantly collapsed. Stomach/Bowel: There is  sigmoid diverticulosis and scattered colonic diverticula without active inflammatory changes. There is no bowel obstruction or active inflammation. The appendix is normal. Lymphatic: No adenopathy. There is mild haziness of the mesenteric fat which may represent infiltration or mild mesenteric panniculitis. Reproductive: The prostate gland is enlarged measuring approximately 6 cm in transverse axial diameter. Other: None Musculoskeletal: No acute or significant osseous findings. IMPRESSION: 1. Occluded appearance of the TIPS. Further evaluation of the TIPS with duplex ultrasound recommended. 2. Findings concerning for a mass in the region of the head/uncinate process of the pancreas. Further evaluation with MRI without and with contrast on a nonemergent/outpatient basis recommended. 3. Mild mesenteric stranding may represent panniculitis.  Clinical correlation is recommended. 4. Colonic diverticulosis. No bowel obstruction. Normal appendix. 5. Enlarged prostate gland. Electronically Signed   By: Anner Crete M.D.   On: 12/11/2019 01:42      EKG: Ordered   A & P   Principal Problem:   Portal vein thrombosis Active Problems:   Elevated lipase   Elevated LFTs   1. Epigastric pain secondary to complete occlusion of indwelling TIPS stent graft  Failed DOAC therapy a. Afebrile and hemodynamically stable on room air b. At [facility] in [place]: CT scan on 9/6 showed an occlusive thrombosis involving the main portal vein, superior mesenteric vein as well as the jejunal and ileal veins with nonocclusive thrombus of the splenic vein. Underwent a TIPS procedure on 11/02/19 and portal vein and mesenteric vein thrombectomy and attempted a balloon maceration of the portal vein and SMV thrombosis. Transjugular lysis of portal and mesenteric veins was performed and he was discharged with Eliquis. c. CTA abdomen pelvis 12/11/2019: Occluded appearance of the TIPS and findings concerning for a mass in the region of the head/uncinate process of the pancreas as well as mild mesenteric stranding possibly representing panniculitis  d. Liver Doppler US notable for: Complete occlusion of the indwelling TIPS stent graft e. ED physician discussed with IR: MRI to help determine planning for the procedure, pending. f. ED physician discussed with Dr. Lorenso Courier, hematology: Convert to heparin and either Lovenox or Coumadin at discharge due to [place] failure  2. Elevated LFTs  Elevated lipase secondary to above a. IV fluids b. Trend labs   DVT prophylaxis: Heparin drip   Code Status: Not on file  Diet: Regular, n.p.o. after midnight Family Communication: Admission, patients condition and plan of care including tests being ordered have been discussed with the patient who indicates understanding and agrees with the plan and Code Status.  Patient's wife was updated  Disposition Plan: The appropriate patient status for this patient is INPATIENT. Inpatient status is judged to be reasonable and necessary in order to provide the required intensity of service to ensure the patient's safety. The patient's presenting symptoms, physical exam findings, and initial radiographic and laboratory data in the context of their chronic comorbidities is felt to place them at high risk for further clinical deterioration. Furthermore, it is not anticipated that the patient will be medically stable for discharge from the hospital within 2 midnights of admission. The following factors support the patient status of inpatient.   " The patient's presenting symptoms include epigastric abdominal pain. " The worrisome physical exam findings include epigastric pain. " The initial radiographic and laboratory data are worrisome because of thrombosis and complete inclusion of TIPS stent graft  " The chronic co-morbidities include prior VTE.   * I certify that at the point of admission it is my clinical judgment  that the patient will require inpatient hospital care spanning beyond 2 midnights from the point of admission due to high intensity of service, high risk for further deterioration and high frequency of surveillance required.*   Status is: Inpatient  Remains inpatient appropriate because:Ongoing diagnostic testing needed not appropriate for outpatient work up, IV treatments appropriate due to intensity of illness or inability to take PO and Inpatient level of care appropriate due to severity of illness   Dispo: The patient is from: Home              Anticipated d/c is to: Home              Anticipated d/c date is: > 3 days              Patient currently is not medically stable to d/c.    The medical decision making on this patient was of high complexity and the patient is at high risk for clinical deterioration, therefore this is a level 3   admission.  Consultants  . IR . Hematology  Procedures  . None  Time Spent on Admission: 75 minutes    Harold Hedge, DO [facility]  12/11/2019, 10:15 AM

## 2019-12-11 NOTE — Progress Notes (Signed)
[place] for heparin Indication: portal vein thrombosis (home eliquis on hold)   No Known Allergies  Patient Measurements:  Height: 5\' 8"  (172.7 cm) Weight: 86.9 kg (191 lb 9.3 oz) IBW/kg (Calculated) : 68.4 Heparin Dosing Weight: 86 kg  Vital Signs: Temp: 97.9 F (36.6 C) (10/17 2136) Temp Source: Oral (10/17 2136) BP: 142/97 (10/17 2136) Pulse Rate: 68 (10/17 2136)  Labs: Recent Labs    12/10/19 2205 12/11/19 0937 12/11/19 1540 12/11/19 1647 12/11/19 2204  HGB 11.4*  --   --   --   --   HCT 35.7*  --   --   --   --   PLT 256  --   --   --   --   APTT  --  32 90*  --  109*  LABPROT  --  15.1  --   --   --   INR  --  1.2  --   --   --   HEPARINUNFRC  --   --   --  >2.20*  --   CREATININE 1.04  --   --   --   --     Estimated Creatinine Clearance: 76.9 mL/min (by C-G formula based on SCr of 1.04 mg/dL).   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 Heme/onc suspects DOAC failure. Pharmacy is consulted on 10/17 to transition to heparin drip.    - 10/17 abd CT: Occluded appearance of the TIPS, Findings concerning for a mass in the region of the head/uncinate process of the pancreas. - 10/17 liver US: Complete occlusion of the indwelling TIPS stent graft. Incomplete sonographic evaluation of the main portal vein, however patency with antegrade flow in the right portal vein is suggestive of at least partial main portal vein patency.  12/11/2019 11:29 PM  APTT = 109 seconds- now slightly above goal range  No bleeding or complications with infusion reported  Goal of Therapy:  Heparin level 0.3-0.7 units/ml aPTT 66-102 seconds Monitor platelets by anticoagulation protocol: Yes   Plan:  - Decrease IV heparin to 1200 units/hr - Check  aPTT in 6hrs to verify therapeutic - Monitor daily CBC, signs/symptoms of bleeding  Netta Cedars, PharmD, BCPS Pharmacy: [phone] 12/11/2019,11:29 PM

## 2019-12-11 NOTE — ED Notes (Signed)
Patient gone to MRI.

## 2019-12-11 NOTE — Progress Notes (Signed)
[place] for heparin Indication: portal vein thrombosis (home eliquis on hold)   No Known Allergies  Patient Measurements: ht 68 inches, weight 86 kg   Heparin Dosing Weight: 86 kg  Vital Signs: Temp: 98.2 F (36.8 C) (10/16 2204) Temp Source: Oral (10/16 2204) BP: 134/94 (10/17 0900) Pulse Rate: 56 (10/17 0900)  Labs: Recent Labs    12/10/19 2205  HGB 11.4*  HCT 35.7*  PLT 256  CREATININE 1.04    Estimated Creatinine Clearance: 76.5 mL/min (by C-G formula based on SCr of 1.04 mg/dL).   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 Heme/onc suspects DOAC failure. Pharmacy is consulted on 10/17 to transition to heparin drip.  - 10/17 abd CT: Occluded appearance of the TIPS, Findings concerning for a mass in the region of the head/uncinate process of the pancreas. - 10/17 liver US: Complete occlusion of the indwelling TIPS stent graft. Incomplete sonographic evaluation of the main portal vein, however patency with antegrade flow in the right portal vein is suggestive of at least partial main portal vein patency.  Goal of Therapy:  Heparin level 0.3-0.7 units/ml aPTT 66-102 seconds Monitor platelets by anticoagulation protocol: Yes   Plan:  - Since DOAC failure is suspected and now with recurrent thrombosis, will heparin drip now at 1300 units/hr - baseline heparin level and aPTT now - check 6 hr heparin level and aPTT after starting heparin drip  Harry Lucero, PharmD, BCPS 12/11/2019 9:48 AM   Harry Lucero 12/11/2019,9:28 AM

## 2019-12-11 NOTE — Consult Note (Signed)
Chief Complaint: Patient was seen in consultation today for TIPS revision.  Referring Physician(s): Marva Panda, MD  Supervising Physician: Ruthann Cancer  Patient Status: [facility] - ED  History of Present Illness: Harry Lucero is a 64 y.o. male with a past medical history significant for GERD and portal vein thrombosis s/p TIPS placement 11/02/19 [facility] in [place]) who presented to the ED today with complaints worsening abdominal pain.   Harry Lucero reports that he was having daily abdominal pain which waxed and waned since the summer time. He was still going to work and did not have any other symptoms aside from squeezing epigastric pain which would come and go. He, his wife and their dog drove to [place] to see his mother at the end of August because she was undergoing a procedure on her heart and he had not seen her for 2-3 years. He noticed his abdominal pain was slightly worse during that visit but on the drive back it became unbearable, they had just passed through [place] and they decided to turn back as his sister lives in [place]. He went to the ED a [facility] and CT scan showed an occlusive thrombus involving the main portal vein, superior mesenteric vein, jejunal and ileal veins as well as a non-occlusive thrombus within the splenic vein. 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 He was seen by IR during that admission and ultimately underwent a TIPS procedure with portal and mesenteric vein thrombectomy as well as attempted balloon maceration of the portal vein and SMV thrombosis which was apparently unsuccessful on 9/8. He reports that he had a second procedure a few days later via his transjugular vein which per available chart review was a transjugular lysis of the portal and mesenteric veins. During this time his wife reports he was on Eliquis and heparin without any bleeding issues. He was admitted to the ICU initially and was told he would be sent to the  progressive care unit followed by general floor admission however they were surprised when they were told the next day he was being discharged. His wife explains that she felt like they were "throwing him out" and thought it was too early to leave the hospital. He was discharged on Eliquis and stayed at his sister's house for a few days before returning to his home in [place] earlier this month. He was referred to heme/onc here in [place] and was seen by Dr. Lindi Adie on 12/05/19 where hypercoagulability panel, lupus anticoagulant and PNH flow cytometry were ordered, he was planned to return in 1 week (10/18) however his abdominal pain progressively worsened over and he presented to the ED for further evaluation.   He tells me that his abdominal pain has never really improved much despite these procedures, however shortly after his hospital admission he did feel well enough to drive home. He rates the pain as 7-10/10 nearly all the time and has not been helped much by medications thus far. He reports a 30 lb weight loss in the last month due to poor appetite, abdominal pain and early satiety. He tells me that he underwent the TIPS procedure using general anesthesia and then the next day or the day after he was told that there was a blockage in the TIPS and he would need another procedure. The subsequent procedure was done through his right neck without sedation and he saw the doctor's "pulling out big chunks of old blood." He was told it was  very rare for someone to develop clots where he had them and that they didn't know why this was happening, but that there was good blood flow to all of his veins after the second procedure. He was also told there was no problem with hist liver function. He has been compliant with his Eliquis since discharge and has not had any issue with bleeding or excessive bruising. He has no personal or family history of clotting disorders, he has never had a DVT or PE (was checked  during previous admission per his report) and has no history of cancer.   Past Medical History:  Diagnosis Date  . Portal vein thrombosis     Past Surgical History:  Procedure Laterality Date  . TIPS PROCEDURE      Allergies: Patient has no known allergies.  Medications: Prior to Admission medications   Medication Sig Start Date End Date Taking? Authorizing Provider  apixaban (ELIQUIS) 5 MG TABS tablet Take 1 tablet (5 mg total) by mouth 2 (two) times daily. 12/05/19  Yes [name], MD  traMADol (ULTRAM) 50 MG tablet Take 50 mg by mouth every 4 (four) hours as needed. 11/22/19  Yes [provider]  Vitamin D, Ergocalciferol, (DRISDOL) 1.25 MG (50000 UNIT) CAPS capsule Take 50,000 Units by mouth once a week. 11/22/19  Yes [provider]  ranitidine (ZANTAC) 150 MG tablet Take 1 tablet (150 mg total) by mouth 2 (two) times daily. Patient not taking: Reported on 12/11/2019 03/19/18 12/11/19  Muthersbaugh, Jarrett Soho, PA-C     History reviewed. No pertinent family history.  Social History   Socioeconomic History  . Marital status: Married    Spouse name: Not on file  . Number of children: Not on file  . Years of education: Not on file  . Highest education level: Not on file  Occupational History  . Not on file  Tobacco Use  . Smoking status: Never Smoker  . Smokeless tobacco: Never Used  Substance and Sexual Activity  . Alcohol use: No  . Drug use: No  . Sexual activity: Not on file  Other Topics Concern  . Not on file  Social History Narrative  . Not on file   Social Determinants of Health   Financial Resource Strain:   . Difficulty of Paying Living Expenses: Not on file  Food Insecurity:   . Worried About Charity fundraiser in the Last Year: Not on file  . Ran Out of Food in the Last Year: Not on file  Transportation Needs:   . Lack of Transportation (Medical): Not on file  . Lack of Transportation (Non-Medical): Not on file  Physical  Activity:   . Days of Exercise per Week: Not on file  . Minutes of Exercise per Session: Not on file  Stress:   . Feeling of Stress : Not on file  Social Connections:   . Frequency of Communication with Friends and Family: Not on file  . Frequency of Social Gatherings with Friends and Family: Not on file  . Attends Religious Services: Not on file  . Active Member of Clubs or Organizations: Not on file  . Attends Archivist Meetings: Not on file  . Marital Status: Not on file     Review of Systems: A 12 point ROS discussed and pertinent positives are indicated in the HPI above.  All other systems are negative.  Review of Systems  Constitutional: Positive for appetite change. Negative for chills and fever.  Respiratory: Negative  for cough and shortness of breath.   Cardiovascular: Negative for chest pain.  Gastrointestinal: Positive for abdominal pain. Negative for diarrhea, nausea and vomiting.  Musculoskeletal: Negative for back pain.  Neurological: Negative for dizziness and headaches.    Vital Signs: BP (!) 141/90   Pulse (!) 54   Temp 98.2 F (36.8 C) (Oral)   Resp 17   SpO2 100%   Physical Exam Vitals and nursing note reviewed.  Constitutional:      General: He is not in acute distress.    Appearance: He is not ill-appearing.     Comments: Seen in ED, sitting in bed watching TV. Wife at bedside.  HENT:     Head: Normocephalic.  Eyes:     General: No scleral icterus. Cardiovascular:     Rate and Rhythm: Regular rhythm. Bradycardia present.  Pulmonary:     Effort: Pulmonary effort is normal.     Breath sounds: Normal breath sounds.  Abdominal:     General: There is no distension.     Palpations: Abdomen is soft.     Tenderness: There is abdominal tenderness.  Skin:    General: Skin is warm and dry.     Coloration: Skin is not jaundiced.  Neurological:     Mental Status: He is alert and oriented to person, place, and time.  Psychiatric:         Mood and Affect: Mood normal.        Behavior: Behavior normal.        Thought Content: Thought content normal.        Judgment: Judgment normal.       Imaging: MR Abdomen W or Wo Contrast  Result Date: 12/11/2019 CLINICAL DATA:  Suspected occluded TIPS, evaluate portal vein thrombosis EXAM: MRI ABDOMEN WITHOUT AND WITH CONTRAST TECHNIQUE: Multiplanar multisequence MR imaging of the abdomen was performed both before and after the administration of intravenous contrast. CONTRAST:  21mL GADAVIST GADOBUTROL 1 MMOL/ML IV SOLN COMPARISON:  Hepatic Doppler dated 12/11/2019. CTA abdomen/pelvis dated 12/11/2019. FINDINGS: Motion degraded images. Lower chest: Lung bases are clear. Hepatobiliary: Suspected cirrhotic configuration with enlargement of the caudate. No suspicious/enhancing hepatic lesions. No hepatic steatosis. Gallbladder is unremarkable. No intrahepatic or extrahepatic ductal dilatation. Pancreas: Mildly prominent pancreatic duct with suspected pancreatic divisum, possibly reflecting sequela of prior/chronic pancreatitis. No pancreatic mass, atrophy, or peripancreatic inflammatory changes. Spleen:  Within normal limits. Adrenals/Urinary Tract:  Adrenal glands are within normal limits. Kidneys are within normal limits.  No hydronephrosis. Stomach/Bowel: Stomach and visualized bowel are unremarkable. Vascular/Lymphatic: Status post TIPS with occlusion. Occluded portal vein with collaterals in the porta hepatis (series 21/image 41). SMV remains patent (series 21/image 58). No suspicious abdominal lymphadenopathy. Other:  No abdominal ascites. Musculoskeletal: No focal osseous lesions. IMPRESSION: Motion degraded images. Status post TIPS with occlusion. Occluded portal vein with collaterals in the porta hepatis. SMV remains patent. Cirrhosis. No suspicious/enhancing hepatic lesions. No abdominal ascites. Spleen is normal in size. Suspected pancreatic divisum with mild prominence of the main (dorsal)  pancreatic duct, possibly reflecting sequela of prior/chronic pancreatitis. No pancreatic mass is evident on MR. Electronically Signed   By: Julian Hy M.D.   On: 12/11/2019 11:28   US LIVER DOPPLER  Result Date: 12/11/2019 CLINICAL DATA:  64 year old male with history of portal vein thrombus status post tips creation in September 2021 in [place]. Presents to the emergency department with epigastric pain similar to prior portal occlusion. Concern for tip thrombosis on recent CT. EXAM: DUPLEX ULTRASOUND  OF LIVER AND TIPS SHUNT TECHNIQUE: Color and duplex Doppler ultrasound was performed to evaluate the hepatic in-flow and out-flow vessels. COMPARISON:  CT abdomen pelvis from the same day FINDINGS: Portal Vein Velocities Main: Not visualized Right:  32.8 cm/sec Left: Not visualized TIPS Stent Velocities Proximal:  0 cm/sec Mid:  0 Distal:  0 cm/sec Hepatic Vein Velocities Right:  36 cm/sec Mid:  14 cm/sec Left:  15 cm/sec Splenic Vein: Patent and normal caliber with antegrade flow measuring up to 29 centimeters/second. Superior Mesenteric Vein: Not visualized. Hepatic Artery: Patent with flow measuring up to 42.4 centimeters/second IVC: Present and patent with normal respiratory phasicity. Spleen: 7.6 cm x 9.4 cm x 8.8 cm with a total volume of 326.2 cm^3 (411 cm^3 is upper limit normal) Portal Vein Occlusion/Thrombus: Yes Splenic Vein Occlusion/Thrombus: No Ascites: None Varices: None IMPRESSION: Complete occlusion of the indwelling TIPS stent graft. Incomplete sonographic evaluation of the main portal vein, however patency with antegrade flow in the right portal vein is suggestive of at least partial main portal vein patency. RECOMMENDATIONS: Consider multiphase MRI abdomen for further characterization of the extent of portal thrombus. Consider Interventional Radiology, Hematology, and Gastroenterology consultations for evaluation for potential TIPS recanalization. These results were called by  telephone at the time of interpretation on 12/11/2019 at 8:50 am to provider Dorie Rank, MD , who verbally acknowledged these results. Ruthann Cancer, MD [facility] Electronically Signed   By: Ruthann Cancer MD   On: 12/11/2019 09:00   CT Angio Abd/Pel W and/or Wo Contrast  Result Date: 12/11/2019 CLINICAL DATA:  64 year old male with history of portal vein thrombosis presenting with epigastric pain. EXAM: CTA ABDOMEN AND PELVIS WITHOUT AND WITH CONTRAST TECHNIQUE: Multidetector CT imaging of the abdomen and pelvis was performed using the standard protocol during bolus administration of intravenous contrast. Multiplanar reconstructed images and MIPs were obtained and reviewed to evaluate the vascular anatomy. CONTRAST:  124mL OMNIPAQUE IOHEXOL 350 MG/ML SOLN COMPARISON:  None. FINDINGS: VASCULAR Aorta: Normal caliber aorta without aneurysm, dissection, vasculitis or significant stenosis. Celiac: Patent without evidence of aneurysm, dissection, vasculitis or significant stenosis. SMA: Patent without evidence of aneurysm, dissection, vasculitis or significant stenosis. Renals: Both renal arteries are patent without evidence of aneurysm, dissection, vasculitis, fibromuscular dysplasia or significant stenosis. IMA: Patent without evidence of aneurysm, dissection, vasculitis or significant stenosis. Inflow: Patent without evidence of aneurysm, dissection, vasculitis or significant stenosis. Proximal Outflow: Bilateral common femoral and visualized portions of the superficial and profunda femoral arteries are patent without evidence of aneurysm, dissection, vasculitis or significant stenosis. Veins: The IVC is unremarkable. There is a TIPS in place. Evaluation of the TIPS is limited on this CT. There is however non opacification of the TIPS most consistent with provided history of shunt occlusion. Further evaluation of the TIPS with duplex ultrasound  recommended. Review of the MIP images confirms the above findings. NON-VASCULAR Lower chest: The visualized lung bases are clear. No intra-abdominal free air or free fluid. Hepatobiliary: The liver is unremarkable. No intrahepatic biliary dilatation. The gallbladder is unremarkable. Pancreas: There is mild dilatation of the main pancreatic duct measuring up to 6 mm in diameter. There is ill-defined fullness of the region of the head and uncinate process of the pancreas concerning for a pancreatic mass. Further characterization with MRI without and with contrast on a nonemergent/outpatient basis recommended. Spleen: Normal in size without focal abnormality. Adrenals/Urinary Tract: The adrenal glands unremarkable. There is no hydronephrosis on either side. There is  symmetric enhancement and excretion of contrast by both kidneys. The visualized ureters appear unremarkable. The urinary bladder is predominantly collapsed. Stomach/Bowel: There is sigmoid diverticulosis and scattered colonic diverticula without active inflammatory changes. There is no bowel obstruction or active inflammation. The appendix is normal. Lymphatic: No adenopathy. There is mild haziness of the mesenteric fat which may represent infiltration or mild mesenteric panniculitis. Reproductive: The prostate gland is enlarged measuring approximately 6 cm in transverse axial diameter. Other: None Musculoskeletal: No acute or significant osseous findings. IMPRESSION: 1. Occluded appearance of the TIPS. Further evaluation of the TIPS with duplex ultrasound recommended. 2. Findings concerning for a mass in the region of the head/uncinate process of the pancreas. Further evaluation with MRI without and with contrast on a nonemergent/outpatient basis recommended. 3. Mild mesenteric stranding may represent panniculitis. Clinical correlation is recommended. 4. Colonic diverticulosis. No bowel obstruction. Normal appendix. 5. Enlarged prostate gland.  Electronically Signed   By: Anner Crete M.D.   On: 12/11/2019 01:42    Labs:  CBC: Recent Labs    12/10/19 2205  WBC 4.1  HGB 11.4*  HCT 35.7*  PLT 256    COAGS: Recent Labs    12/11/19 0937  INR 1.2  APTT 32    BMP: Recent Labs    12/10/19 2205  NA 142  K 4.2  CL 111  CO2 23  GLUCOSE 113*  BUN 15  CALCIUM 9.5  CREATININE 1.04  GFRNONAA >60    LIVER FUNCTION TESTS: Recent Labs    12/10/19 2205  BILITOT 0.9  AST 62*  ALT 71*  ALKPHOS 92  PROT 7.2  ALBUMIN 3.7    TUMOR MARKERS: No results for input(s): AFPTM, CEA, CA199, CHROMGRNA in the last 8760 hours.  Assessment and Plan:  64 y/o M with history of portal vein occlusion s/p TIPS placement with portal and mesenteric vein thrombectomy as well as attempted balloon maceration of the portal vein and SMV thrombosis which was apparently unsuccessful on 9/8 in [facility]). He also udnerwent a transjugular lysis of the portal and mesenteric veins. He presented to the ED today with worsening abdominal pain and was found to have re-occlusion of the TIPS. IR has been consulted for possible TIPS revision.  Patient history and imaging reviewed by Dr. Serafina Royals today - please see his detailed recommendations above.  Essentially, he is likely a candidate for TIPS revision which could be done on an outpatient basis. If it is felt he would require this procedure while admitted he would need to be transferred to [facility]. I discussed the basics of TIPS revision with Mr. Frericks and his wife today who would be agreeable to this. Dr. Serafina Royals will see the patient later today and discuss the procedure and indications with them in more detail.  No immediate procedure planned in IR at this time - ok to restart diet from our perspective as we continue to work on timing. No consent form signed at this time.   IR will continue to follow along - please call with questions or concerns.   Thank you for this  interesting consult.  I greatly enjoyed meeting Harry Lucero and look forward to participating in their care.  A copy of this report was sent to the requesting provider on this date.  Electronically Signed: Joaquim Nam, PA-C 12/11/2019, 3:16 PM   I spent a total of 40 Minutes  in face to face in clinical consultation, greater than 50% of which was counseling/coordinating care for TIPS  revision.

## 2019-12-12 ENCOUNTER — Other Ambulatory Visit: Payer: Self-pay | Admitting: Radiology

## 2019-12-12 ENCOUNTER — Inpatient Hospital Stay: Payer: BC Managed Care – PPO | Admitting: Hematology and Oncology

## 2019-12-12 DIAGNOSIS — I81 Portal vein thrombosis: Secondary | ICD-10-CM | POA: Diagnosis not present

## 2019-12-12 LAB — COMPREHENSIVE METABOLIC PANEL
ALT: 54 U/L — ABNORMAL HIGH (ref 0–44)
AST: 32 U/L (ref 15–41)
Albumin: 3.7 g/dL (ref 3.5–5.0)
Alkaline Phosphatase: 86 U/L (ref 38–126)
Anion gap: 9 (ref 5–15)
BUN: 12 mg/dL (ref 8–23)
CO2: 23 mmol/L (ref 22–32)
Calcium: 9.2 mg/dL (ref 8.9–10.3)
Chloride: 104 mmol/L (ref 98–111)
Creatinine, Ser: 1 mg/dL (ref 0.61–1.24)
GFR, Estimated: >60 mL/min (ref >60)
Glucose, Bld: 94 mg/dL (ref 70–99)
Potassium: 3.9 mmol/L (ref 3.5–5.1)
Sodium: 136 mmol/L (ref 135–145)
Total Bilirubin: 1.3 mg/dL — ABNORMAL HIGH (ref 0.3–1.2)
Total Protein: 7.1 g/dL (ref 6.5–8.1)

## 2019-12-12 LAB — CBC
HCT: 37.8 % — ABNORMAL LOW (ref 39.0–52.0)
Hemoglobin: 12.3 g/dL — ABNORMAL LOW (ref 13.0–17.0)
MCH: 26.6 pg (ref 26.0–34.0)
MCHC: 32.5 g/dL (ref 30.0–36.0)
MCV: 81.8 fL (ref 80.0–100.0)
Platelets: 294 10*3/uL (ref 150–400)
RBC: 4.62 MIL/uL (ref 4.22–5.81)
RDW: 15.8 % — ABNORMAL HIGH (ref 11.5–15.5)
WBC: 4.8 10*3/uL (ref 4.0–10.5)
nRBC: 0 % (ref 0.0–0.2)

## 2019-12-12 LAB — LIPASE, BLOOD: Lipase: 72 U/L — ABNORMAL HIGH (ref 11–51)

## 2019-12-12 LAB — HEPARIN LEVEL (UNFRACTIONATED): Heparin Unfractionated: 1.44 IU/mL — ABNORMAL HIGH (ref 0.30–0.70)

## 2019-12-12 LAB — APTT: aPTT: 95 seconds — ABNORMAL HIGH (ref 24–36)

## 2019-12-12 MED ORDER — ENOXAPARIN SODIUM 100 MG/ML ~~LOC~~ SOLN
1.0000 mg/kg | Freq: Two times a day (BID) | SUBCUTANEOUS | Status: DC
  Administered 2019-12-12: 87.5 mg via SUBCUTANEOUS
  Filled 2019-12-12: qty 1

## 2019-12-12 MED ORDER — ENOXAPARIN (LOVENOX) PATIENT EDUCATION KIT
PACK | Freq: Once | Status: AC
  Filled 2019-12-12: qty 1

## 2019-12-12 MED ORDER — ENOXAPARIN SODIUM 100 MG/ML ~~LOC~~ SOLN: 90.0000 mg | Freq: Two times a day (BID) | SUBCUTANEOUS | Status: DC

## 2019-12-12 MED ORDER — ENOXAPARIN SODIUM 100 MG/ML ~~LOC~~ SOLN
90.0000 mg | Freq: Two times a day (BID) | SUBCUTANEOUS | 0 refills | Status: DC
Start: 2019-12-12 — End: 2020-01-13

## 2019-12-12 NOTE — Discharge Summary (Signed)
Physician Discharge Summary  Harry Lucero HYW:[id number] DOB: [birth year] DOA: 12/11/2019  PCP: System, Provider Not In  Admit date: 12/11/2019 Discharge date: 12/12/2019  Admitted From: Home Disposition: Home  Recommendations for Outpatient Follow-up:  1. Follow up with PCP in 1-2 weeks 2. Continue to take Lovenox injections until radiology follow-up  Home Health: Not applicable Equipment/Devices: No need  Discharge Condition: Stable CODE STATUS: Full code Diet recommendation: Regular diet  Discharge summary: 64 year old gentleman with history of portal vein thrombosis status post TIPS procedure in [place] 11/02/2019 and currently on Eliquis presented to ER with a squeezing epigastric pain ongoing since his procedure, taking tramadol scheduled, worse for last 2 to 3 days. 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 In the ER vitals were stable. Mild elevated LFTs. CTA of the abdomen pelvis occluded evidence of TIPS, MRI showed TIPS occlusion, collaterals present in the porta hepatis with occluded portal vein SMV is patent. No ascites, no ischemia. Patient was admitted to the hospital, started on heparin infusion and consulted by hematology, interventional radiology.  As per radiology, there is no indication for urgent TIPS revision or any procedures. They would like to follow-up as outpatient and trying to get images from previous hospital. Patient is stable, he is been without any abdominal pain or symptoms since admission yesterday.  Plan: -With positive APS antibody, patient will benefit with anticoagulation with Coumadin. Will keep patient on Lovenox subcu therapeutic dose until next follow-up with interventional radiology. -Discontinue Eliquis. -Patient and wife educated/trained to use Lovenox injection at home. -Patient is fairly asymptomatic today and is able to go home as he does not have any inpatient procedure planned.   Discharge Diagnoses:  Principal Problem:    Portal vein thrombosis Active Problems:   Elevated lipase   Elevated LFTs    Discharge Instructions  Discharge Instructions    Call MD for:  difficulty breathing, headache or visual disturbances   Complete by: As directed    Call MD for:  persistant nausea and vomiting   Complete by: As directed    Call MD for:  severe uncontrolled pain   Complete by: As directed    Diet - low sodium heart healthy   Complete by: As directed    Discharge instructions   Complete by: As directed    Take blood thinner without any interruption Follow up with radiology, they will call for follow up   Increase activity slowly   Complete by: As directed      Allergies as of 12/12/2019   No Known Allergies     Medication List    STOP taking these medications   apixaban 5 MG Tabs tablet Commonly known as: ELIQUIS   ibuprofen 800 MG tablet Commonly known as: ADVIL   naproxen sodium 220 MG tablet Commonly known as: ALEVE   ranitidine 150 MG tablet Commonly known as: ZANTAC     TAKE these medications   enoxaparin 100 MG/ML injection Commonly known as: LOVENOX Inject 0.9 mLs (90 mg total) into the skin 2 (two) times daily.   traMADol 50 MG tablet Commonly known as: ULTRAM Take 50 mg by mouth every 4 (four) hours as needed.   Vitamin D (Ergocalciferol) 1.25 MG (50000 UNIT) Caps capsule Commonly known as: DRISDOL Take 50,000 Units by mouth once a week.       Follow-up Information    [name], MD Follow up in 3 week(s).   Specialty: Hematology and Oncology Contact information: [place] [address]  [phone]              No Known Allergies  Consultations:  Oncology  Interventional radiology   Procedures/Studies: MR Abdomen W or Wo Contrast  Result Date: 12/11/2019 CLINICAL DATA:  Suspected occluded TIPS, evaluate portal vein thrombosis EXAM: MRI ABDOMEN WITHOUT AND WITH CONTRAST TECHNIQUE: Multiplanar multisequence MR imaging  of the abdomen was performed both before and after the administration of intravenous contrast. CONTRAST:  37mL GADAVIST GADOBUTROL 1 MMOL/ML IV SOLN COMPARISON:  Hepatic Doppler dated 12/11/2019. CTA abdomen/pelvis dated 12/11/2019. FINDINGS: Motion degraded images. Lower chest: Lung bases are clear. Hepatobiliary: Suspected cirrhotic configuration with enlargement of the caudate. No suspicious/enhancing hepatic lesions. No hepatic steatosis. Gallbladder is unremarkable. No intrahepatic or extrahepatic ductal dilatation. Pancreas: Mildly prominent pancreatic duct with suspected pancreatic divisum, possibly reflecting sequela of prior/chronic pancreatitis. No pancreatic mass, atrophy, or peripancreatic inflammatory changes. Spleen:  Within normal limits. Adrenals/Urinary Tract:  Adrenal glands are within normal limits. Kidneys are within normal limits.  No hydronephrosis. Stomach/Bowel: Stomach and visualized bowel are unremarkable. Vascular/Lymphatic: Status post TIPS with occlusion. Occluded portal vein with collaterals in the porta hepatis (series 21/image 41). SMV remains patent (series 21/image 58). No suspicious abdominal lymphadenopathy. Other:  No abdominal ascites. Musculoskeletal: No focal osseous lesions. IMPRESSION: Motion degraded images. Status post TIPS with occlusion. Occluded portal vein with collaterals in the porta hepatis. SMV remains patent. Cirrhosis. No suspicious/enhancing hepatic lesions. No abdominal ascites. Spleen is normal in size. Suspected pancreatic divisum with mild prominence of the main (dorsal) pancreatic duct, possibly reflecting sequela of prior/chronic pancreatitis. No pancreatic mass is evident on MR. Electronically Signed   By: Julian Hy M.D.   On: 12/11/2019 11:28   US LIVER DOPPLER  Result Date: 12/11/2019 CLINICAL DATA:  64 year old male with history of portal vein thrombus status post tips creation in September 2021 in [place]. Presents to the emergency  department with epigastric pain similar to prior portal occlusion. Concern for tip thrombosis on recent CT. EXAM: DUPLEX ULTRASOUND OF LIVER AND TIPS SHUNT TECHNIQUE: Color and duplex Doppler ultrasound was performed to evaluate the hepatic in-flow and out-flow vessels. COMPARISON:  CT abdomen pelvis from the same day FINDINGS: Portal Vein Velocities Main: Not visualized Right:  32.8 cm/sec Left: Not visualized TIPS Stent Velocities Proximal:  0 cm/sec Mid:  0 Distal:  0 cm/sec Hepatic Vein Velocities Right:  36 cm/sec Mid:  14 cm/sec Left:  15 cm/sec Splenic Vein: Patent and normal caliber with antegrade flow measuring up to 29 centimeters/second. Superior Mesenteric Vein: Not visualized. Hepatic Artery: Patent with flow measuring up to 42.4 centimeters/second IVC: Present and patent with normal respiratory phasicity. Spleen: 7.6 cm x 9.4 cm x 8.8 cm with a total volume of 326.2 cm^3 (411 cm^3 is upper limit normal) Portal Vein Occlusion/Thrombus: Yes Splenic Vein Occlusion/Thrombus: No Ascites: None Varices: None IMPRESSION: Complete occlusion of the indwelling TIPS stent graft. Incomplete sonographic evaluation of the main portal vein, however patency with antegrade flow in the right portal vein is suggestive of at least partial main portal vein patency. RECOMMENDATIONS: Consider multiphase MRI abdomen for further characterization of the extent of portal thrombus. Consider Interventional Radiology, Hematology, and Gastroenterology consultations for evaluation for potential TIPS recanalization. These results were called by telephone at the time of interpretation on 12/11/2019 at 8:50 am to provider Dorie Rank, MD , who verbally acknowledged these results. Ruthann Cancer, MD [facility] Radiology Electronically Signed   By: Ruthann Cancer MD  On: 12/11/2019 09:00   CT Angio Abd/Pel W and/or Wo Contrast  Result Date: 12/11/2019 CLINICAL DATA:  64 year old male with  history of portal vein thrombosis presenting with epigastric pain. EXAM: CTA ABDOMEN AND PELVIS WITHOUT AND WITH CONTRAST TECHNIQUE: Multidetector CT imaging of the abdomen and pelvis was performed using the standard protocol during bolus administration of intravenous contrast. Multiplanar reconstructed images and MIPs were obtained and reviewed to evaluate the vascular anatomy. CONTRAST:  170mL OMNIPAQUE IOHEXOL 350 MG/ML SOLN COMPARISON:  None. FINDINGS: VASCULAR Aorta: Normal caliber aorta without aneurysm, dissection, vasculitis or significant stenosis. Celiac: Patent without evidence of aneurysm, dissection, vasculitis or significant stenosis. SMA: Patent without evidence of aneurysm, dissection, vasculitis or significant stenosis. Renals: Both renal arteries are patent without evidence of aneurysm, dissection, vasculitis, fibromuscular dysplasia or significant stenosis. IMA: Patent without evidence of aneurysm, dissection, vasculitis or significant stenosis. Inflow: Patent without evidence of aneurysm, dissection, vasculitis or significant stenosis. Proximal Outflow: Bilateral common femoral and visualized portions of the superficial and profunda femoral arteries are patent without evidence of aneurysm, dissection, vasculitis or significant stenosis. Veins: The IVC is unremarkable. There is a TIPS in place. Evaluation of the TIPS is limited on this CT. There is however non opacification of the TIPS most consistent with provided history of shunt occlusion. Further evaluation of the TIPS with duplex ultrasound recommended. Review of the MIP images confirms the above findings. NON-VASCULAR Lower chest: The visualized lung bases are clear. No intra-abdominal free air or free fluid. Hepatobiliary: The liver is unremarkable. No intrahepatic biliary dilatation. The gallbladder is unremarkable. Pancreas: There is mild dilatation of the main pancreatic duct measuring up to 6 mm in diameter. There is ill-defined  fullness of the region of the head and uncinate process of the pancreas concerning for a pancreatic mass. Further characterization with MRI without and with contrast on a nonemergent/outpatient basis recommended. Spleen: Normal in size without focal abnormality. Adrenals/Urinary Tract: The adrenal glands unremarkable. There is no hydronephrosis on either side. There is symmetric enhancement and excretion of contrast by both kidneys. The visualized ureters appear unremarkable. The urinary bladder is predominantly collapsed. Stomach/Bowel: There is sigmoid diverticulosis and scattered colonic diverticula without active inflammatory changes. There is no bowel obstruction or active inflammation. The appendix is normal. Lymphatic: No adenopathy. There is mild haziness of the mesenteric fat which may represent infiltration or mild mesenteric panniculitis. Reproductive: The prostate gland is enlarged measuring approximately 6 cm in transverse axial diameter. Other: None Musculoskeletal: No acute or significant osseous findings. IMPRESSION: 1. Occluded appearance of the TIPS. Further evaluation of the TIPS with duplex ultrasound recommended. 2. Findings concerning for a mass in the region of the head/uncinate process of the pancreas. Further evaluation with MRI without and with contrast on a nonemergent/outpatient basis recommended. 3. Mild mesenteric stranding may represent panniculitis. Clinical correlation is recommended. 4. Colonic diverticulosis. No bowel obstruction. Normal appendix. 5. Enlarged prostate gland. Electronically Signed   By: Anner Crete M.D.   On: 12/11/2019 01:42   (Echo, Carotid, EGD, Colonoscopy, ERCP)    Subjective: Patient seen and examined. In the morning rounds he had no complaints. Went to examine patient again for discharge redness, wife at the bedside. Patient without any nausea vomiting diarrhea. Patient without any abdominal pain. Multiple questions answered. Patient was trained to  take Lovenox injections. He is eager to go home as we have no procedures planned.   Discharge Exam: Vitals:   12/12/19 0619 12/12/19 1454  BP: (!) 143/89 (!) 133/99  Pulse: 60 78  Resp: 18 20  Temp: 98.2 F (36.8 C) 98.2 F (36.8 C)  SpO2: 97% 100%   Vitals:   12/12/19 0250 12/12/19 0618 12/12/19 0619 12/12/19 1454  BP: 119/77 (!) 146/101 (!) 143/89 (!) 133/99  Pulse: 66 (!) 58 60 78  Resp: 18 18 18 20   Temp: 97.8 F (36.6 C) 98.2 F (36.8 C) 98.2 F (36.8 C) 98.2 F (36.8 C)  TempSrc: Oral Oral Oral Oral  SpO2: 99% 99% 97% 100%  Weight:      Height:        General: Pt is alert, awake, not in acute distress Cardiovascular: RRR, S1/S2 +, no rubs, no gallops Respiratory: CTA bilaterally, no wheezing, no rhonchi Abdominal: Soft, NT, ND, bowel sounds + Extremities: no edema, no cyanosis    The results of significant diagnostics from this hospitalization (including imaging, microbiology, ancillary and laboratory) are listed below for reference.     Microbiology: No results found for this or any previous visit (from the past 240 hour(s)).   Labs: BNP (last 3 results) No results for input(s): BNP in the last 8760 hours. Basic Metabolic Panel: Recent Labs  Lab 12/10/19 2205 12/12/19 0607  NA 142 136  K 4.2 3.9  CL 111 104  CO2 23 23  GLUCOSE 113* 94  BUN 15 12  CREATININE 1.04 1.00  CALCIUM 9.5 9.2   Liver Function Tests: Recent Labs  Lab 12/10/19 2205 12/12/19 0607  AST 62* 32  ALT 71* 54*  ALKPHOS 92 86  BILITOT 0.9 1.3*  PROT 7.2 7.1  ALBUMIN 3.7 3.7   Recent Labs  Lab 12/10/19 2205 12/12/19 0607  LIPASE 91* 72*   No results for input(s): AMMONIA in the last 168 hours. CBC: Recent Labs  Lab 12/10/19 2205 12/12/19 0607  WBC 4.1 4.8  HGB 11.4* 12.3*  HCT 35.7* 37.8*  MCV 84.2 81.8  PLT 256 294   Cardiac Enzymes: No results for input(s): CKTOTAL, CKMB, CKMBINDEX, TROPONINI in the last 168 hours. BNP: Invalid input(s):  POCBNP CBG: No results for input(s): GLUCAP in the last 168 hours. D-Dimer No results for input(s): DDIMER in the last 72 hours. Hgb A1c No results for input(s): HGBA1C in the last 72 hours. Lipid Profile No results for input(s): CHOL, HDL, LDLCALC, TRIG, CHOLHDL, LDLDIRECT in the last 72 hours. Thyroid function studies No results for input(s): TSH, T4TOTAL, T3FREE, THYROIDAB in the last 72 hours.  Invalid input(s): FREET3 Anemia work up No results for input(s): VITAMINB12, FOLATE, FERRITIN, TIBC, IRON, RETICCTPCT in the last 72 hours. Urinalysis    Component Value Date/Time   COLORURINE STRAW (A) 12/10/2019 2205   APPEARANCEUR CLEAR 12/10/2019 2205   LABSPEC >1.046 (H) 12/10/2019 2205   PHURINE 5.0 12/10/2019 2205   GLUCOSEU NEGATIVE 12/10/2019 2205   HGBUR NEGATIVE 12/10/2019 2205   BILIRUBINUR NEGATIVE 12/10/2019 2205   KETONESUR NEGATIVE 12/10/2019 2205   PROTEINUR NEGATIVE 12/10/2019 2205   NITRITE NEGATIVE 12/10/2019 2205   LEUKOCYTESUR NEGATIVE 12/10/2019 2205   Sepsis Labs Invalid input(s): PROCALCITONIN,  WBC,  LACTICIDVEN Microbiology No results found for this or any previous visit (from the past 240 hour(s)).   Time coordinating discharge:  40 minutes  SIGNED:   Barb Merino, MD  [facility] 12/12/2019, 3:32 PM

## 2019-12-12 NOTE — Plan of Care (Signed)

## 2019-12-12 NOTE — Progress Notes (Signed)
IR consulted for possible TIPS revision. Patient with history of portal vein thrombosis s/p TIPS presented to ED at [facility] with abdominal pain above baseline. Per notes from care everywhere patient has a TIPS revision performed on 9.6.21 at  [facility] in [place] . Patient denies any indication of refractory ascites or bleeding. Imaging reflects occluded portal veins with collaterals. As Patient condition has improved and patient has no emergent medication needs procedure can be performed at outpatient. This was discussed with Primary Team who concurs patient is medically stable and agrees with the plan of care. As patient was found to anti lipid phosphorus and was currently yon eliquis the patient will be discharged on lovenox with a bridge to coumadin.   IR schedulers will contact patient directly with appointment information.   Please see note from 10.17.21 for further documentation.

## 2019-12-12 NOTE — Progress Notes (Signed)
Pt heparin gtt discontinued at this, noted. Lovenox teaching is completed and pt did teach back and understood, noted.

## 2019-12-12 NOTE — Progress Notes (Signed)
HEMATOLOGY-ONCOLOGY PROGRESS NOTE  SUBJECTIVE: Resting quietly today.  No specific complaints.  No bleeding reported.  REVIEW OF SYSTEMS:   Constitutional: Denies fevers, chills  Eyes: Denies blurriness of vision Ears, nose, mouth, throat, and face: Denies mucositis or sore throat Respiratory: Denies cough, dyspnea or wheezes Cardiovascular: Denies palpitation, chest discomfort Gastrointestinal: Abdominal pain overall controlled, no nausea or vomiting Skin: Denies abnormal skin rashes Lymphatics: Denies new lymphadenopathy or easy bruising Neurological:Denies numbness, tingling or new weaknesses Behavioral/Psych: Mood is stable, no new changes  Extremities: No lower extremity edema All other systems were reviewed with the patient and are negative.  I have reviewed the past medical history, past surgical history, social history and family history with the patient and they are unchanged from previous note.   PHYSICAL EXAMINATION:  Vitals:   12/12/19 0618 12/12/19 0619  BP: (!) 146/101 (!) 143/89  Pulse: (!) 58 60  Resp: 18 18  Temp: 98.2 F (36.8 C) 98.2 F (36.8 C)  SpO2: 99% 97%   Filed Weights   12/11/19 1636  Weight: 86.9 kg    Intake/Output from previous day: 10/17 0701 - 10/18 0700 In: 2029.4 [P.O.:420; I.V.:609.4; IV Piggyback:1000] Out: 2560 [Urine:2560]  GENERAL:alert, no distress and comfortable SKIN: skin color, texture, turgor are normal, no rashes or significant lesions LUNGS: clear to auscultation and percussion with normal breathing effort HEART: regular rate & rhythm and no murmurs and no lower extremity edema ABDOMEN:abdomen soft, non-tender and normal bowel sounds Musculoskeletal:no cyanosis of digits and no clubbing  NEURO: alert & oriented x 3 with fluent speech, no focal motor/sensory deficits  LABORATORY DATA:  I have reviewed the data as listed CMP Latest Ref Rng & Units 12/12/2019 12/10/2019 03/18/2018  Glucose 70 - 99 mg/dL 94 113(H) 102(H)   BUN 8 - 23 mg/dL 12 15 13   Creatinine 0.61 - 1.24 mg/dL 1.00 1.04 1.20  Sodium 135 - 145 mmol/L 136 142 139  Potassium 3.5 - 5.1 mmol/L 3.9 4.2 4.1  Chloride 98 - 111 mmol/L 104 111 106  CO2 22 - 32 mmol/L 23 23 25   Calcium 8.9 - 10.3 mg/dL 9.2 9.5 9.6  Total Protein 6.5 - 8.1 g/dL 7.1 7.2 7.2  Total Bilirubin 0.3 - 1.2 mg/dL 1.3(H) 0.9 0.9  Alkaline Phos 38 - 126 U/L 86 92 60  AST 15 - 41 U/L 32 62(H) 34  ALT 0 - 44 U/L 54(H) 71(H) 54(H)    Lab Results  Component Value Date   WBC 4.8 12/12/2019   HGB 12.3 (L) 12/12/2019   HCT 37.8 (L) 12/12/2019   MCV 81.8 12/12/2019   PLT 294 12/12/2019    MR Abdomen W or Wo Contrast  Result Date: 12/11/2019 CLINICAL DATA:  Suspected occluded TIPS, evaluate portal vein thrombosis EXAM: MRI ABDOMEN WITHOUT AND WITH CONTRAST TECHNIQUE: Multiplanar multisequence MR imaging of the abdomen was performed both before and after the administration of intravenous contrast. CONTRAST:  70mL GADAVIST GADOBUTROL 1 MMOL/ML IV SOLN COMPARISON:  Hepatic Doppler dated 12/11/2019. CTA abdomen/pelvis dated 12/11/2019. FINDINGS: Motion degraded images. Lower chest: Lung bases are clear. Hepatobiliary: Suspected cirrhotic configuration with enlargement of the caudate. No suspicious/enhancing hepatic lesions. No hepatic steatosis. Gallbladder is unremarkable. No intrahepatic or extrahepatic ductal dilatation. Pancreas: Mildly prominent pancreatic duct with suspected pancreatic divisum, possibly reflecting sequela of prior/chronic pancreatitis. No pancreatic mass, atrophy, or peripancreatic inflammatory changes. Spleen:  Within normal limits. Adrenals/Urinary Tract:  Adrenal glands are within normal limits. Kidneys are within normal limits.  No hydronephrosis. Stomach/Bowel:  Stomach and visualized bowel are unremarkable. Vascular/Lymphatic: Status post TIPS with occlusion. Occluded portal vein with collaterals in the porta hepatis (series 21/image 41). SMV remains patent  (series 21/image 58). No suspicious abdominal lymphadenopathy. Other:  No abdominal ascites. Musculoskeletal: No focal osseous lesions. IMPRESSION: Motion degraded images. Status post TIPS with occlusion. Occluded portal vein with collaterals in the porta hepatis. SMV remains patent. Cirrhosis. No suspicious/enhancing hepatic lesions. No abdominal ascites. Spleen is normal in size. Suspected pancreatic divisum with mild prominence of the main (dorsal) pancreatic duct, possibly reflecting sequela of prior/chronic pancreatitis. No pancreatic mass is evident on MR. Electronically Signed   By: Julian Hy M.D.   On: 12/11/2019 11:28   US LIVER DOPPLER  Result Date: 12/11/2019 CLINICAL DATA:  64 year old male with history of portal vein thrombus status post tips creation in September 2021 in [place]. Presents to the emergency department with epigastric pain similar to prior portal occlusion. Concern for tip thrombosis on recent CT. EXAM: DUPLEX ULTRASOUND OF LIVER AND TIPS SHUNT TECHNIQUE: Color and duplex Doppler ultrasound was performed to evaluate the hepatic in-flow and out-flow vessels. COMPARISON:  CT abdomen pelvis from the same day FINDINGS: Portal Vein Velocities Main: Not visualized Right:  32.8 cm/sec Left: Not visualized TIPS Stent Velocities Proximal:  0 cm/sec Mid:  0 Distal:  0 cm/sec Hepatic Vein Velocities Right:  36 cm/sec Mid:  14 cm/sec Left:  15 cm/sec Splenic Vein: Patent and normal caliber with antegrade flow measuring up to 29 centimeters/second. Superior Mesenteric Vein: Not visualized. Hepatic Artery: Patent with flow measuring up to 42.4 centimeters/second IVC: Present and patent with normal respiratory phasicity. Spleen: 7.6 cm x 9.4 cm x 8.8 cm with a total volume of 326.2 cm^3 (411 cm^3 is upper limit normal) Portal Vein Occlusion/Thrombus: Yes Splenic Vein Occlusion/Thrombus: No Ascites: None Varices: None IMPRESSION: Complete occlusion of the indwelling TIPS stent graft.  Incomplete sonographic evaluation of the main portal vein, however patency with antegrade flow in the right portal vein is suggestive of at least partial main portal vein patency. RECOMMENDATIONS: Consider multiphase MRI abdomen for further characterization of the extent of portal thrombus. Consider Interventional Radiology, Hematology, and Gastroenterology consultations for evaluation for potential TIPS recanalization. These results were called by telephone at the time of interpretation on 12/11/2019 at 8:50 am to provider Dorie Rank, MD , who verbally acknowledged these results. Ruthann Cancer, MD [facility] Radiology Electronically Signed   By: Ruthann Cancer MD   On: 12/11/2019 09:00   CT Angio Abd/Pel W and/or Wo Contrast  Result Date: 12/11/2019 CLINICAL DATA:  64 year old male with history of portal vein thrombosis presenting with epigastric pain. EXAM: CTA ABDOMEN AND PELVIS WITHOUT AND WITH CONTRAST TECHNIQUE: Multidetector CT imaging of the abdomen and pelvis was performed using the standard protocol during bolus administration of intravenous contrast. Multiplanar reconstructed images and MIPs were obtained and reviewed to evaluate the vascular anatomy. CONTRAST:  1108mL OMNIPAQUE IOHEXOL 350 MG/ML SOLN COMPARISON:  None. FINDINGS: VASCULAR Aorta: Normal caliber aorta without aneurysm, dissection, vasculitis or significant stenosis. Celiac: Patent without evidence of aneurysm, dissection, vasculitis or significant stenosis. SMA: Patent without evidence of aneurysm, dissection, vasculitis or significant stenosis. Renals: Both renal arteries are patent without evidence of aneurysm, dissection, vasculitis, fibromuscular dysplasia or significant stenosis. IMA: Patent without evidence of aneurysm, dissection, vasculitis or significant stenosis. Inflow: Patent without evidence of aneurysm, dissection, vasculitis or significant stenosis. Proximal Outflow:  Bilateral common femoral and visualized portions of the superficial and  profunda femoral arteries are patent without evidence of aneurysm, dissection, vasculitis or significant stenosis. Veins: The IVC is unremarkable. There is a TIPS in place. Evaluation of the TIPS is limited on this CT. There is however non opacification of the TIPS most consistent with provided history of shunt occlusion. Further evaluation of the TIPS with duplex ultrasound recommended. Review of the MIP images confirms the above findings. NON-VASCULAR Lower chest: The visualized lung bases are clear. No intra-abdominal free air or free fluid. Hepatobiliary: The liver is unremarkable. No intrahepatic biliary dilatation. The gallbladder is unremarkable. Pancreas: There is mild dilatation of the main pancreatic duct measuring up to 6 mm in diameter. There is ill-defined fullness of the region of the head and uncinate process of the pancreas concerning for a pancreatic mass. Further characterization with MRI without and with contrast on a nonemergent/outpatient basis recommended. Spleen: Normal in size without focal abnormality. Adrenals/Urinary Tract: The adrenal glands unremarkable. There is no hydronephrosis on either side. There is symmetric enhancement and excretion of contrast by both kidneys. The visualized ureters appear unremarkable. The urinary bladder is predominantly collapsed. Stomach/Bowel: There is sigmoid diverticulosis and scattered colonic diverticula without active inflammatory changes. There is no bowel obstruction or active inflammation. The appendix is normal. Lymphatic: No adenopathy. There is mild haziness of the mesenteric fat which may represent infiltration or mild mesenteric panniculitis. Reproductive: The prostate gland is enlarged measuring approximately 6 cm in transverse axial diameter. Other: None Musculoskeletal: No acute or significant osseous findings. IMPRESSION: 1. Occluded appearance of the TIPS. Further  evaluation of the TIPS with duplex ultrasound recommended. 2. Findings concerning for a mass in the region of the head/uncinate process of the pancreas. Further evaluation with MRI without and with contrast on a nonemergent/outpatient basis recommended. 3. Mild mesenteric stranding may represent panniculitis. Clinical correlation is recommended. 4. Colonic diverticulosis. No bowel obstruction. Normal appendix. 5. Enlarged prostate gland. Electronically Signed   By: Anner Crete M.D.   On: 12/11/2019 01:42    ASSESSMENT AND PLAN: 1.  Portal vein thrombosis 2.  Abdominal pain secondary to portal vein thrombosis and occlusion of TIPS 3.  APS antibody positive 4.  Cirrhosis noted on MRI of abdomen 5.  Elevated LFTs, improving  -Given positive APS antibody, recommend transitioning to warfarin for anticoagulation.  The patient may be bridged with Lovenox. -Recommend repeat of APS antibodies in approximately 3 months (January 2022).  JAK2 pending and will follow up on these results. -IR has evaluated the patient and no urgent plans for revision of TIPS.  They plan to continue to follow-up with the patient as an outpatient. -The patient will need further work-up for his cirrhosis.  This can be done as an outpatient once stable for discharge.   LOS: 1 day   Mikey Bussing, DNP, AGPCNP-BC, AOCNP 12/12/19

## 2019-12-12 NOTE — Progress Notes (Addendum)
[facility] for heparin Indication: portal vein thrombosis (home eliquis on hold)   No Known Allergies  Patient Measurements:  Height: 5' 8"  (172.7 cm) Weight: 86.9 kg (191 lb 9.3 oz) IBW/kg (Calculated) : 68.4 Heparin Dosing Weight: 86 kg  Vital Signs: Temp: 98.2 F (36.8 C) (10/18 0619) Temp Source: Oral (10/18 0619) BP: 143/89 (10/18 0619) Pulse Rate: 60 (10/18 0619)  Labs: Recent Labs    12/10/19 2205 12/11/19 2671 12/11/19 0937 12/11/19 1540 12/11/19 1647 12/11/19 2204 12/12/19 0607  HGB 11.4*  --   --   --   --   --  12.3*  HCT 35.7*  --   --   --   --   --  37.8*  PLT 256  --   --   --   --   --  294  APTT  --  32   < > 90*  --  109* 95*  LABPROT  --  15.1  --   --   --   --   --   INR  --  1.2  --   --   --   --   --   HEPARINUNFRC  --   --   --   --  >2.20*  --  1.44*  CREATININE 1.04  --   --   --   --   --  1.00   < > = values in this interval not displayed.    Estimated Creatinine Clearance: 80 mL/min (by C-G formula based on SCr of 1 mg/dL).  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 Heme/onc suspects DOAC failure. Pharmacy is consulted on 10/17 to transition anticoagulation to heparin drip.    - 10/17 abd CT: Occluded appearance of the TIPS, Findings concerning for a mass in the region of the head/uncinate process of the pancreas. - 10/17 liver [place] Complete occlusion of the indwelling TIPS stent graft. Incomplete sonographic evaluation of the main portal vein, however patency with antegrade flow in the right portal vein is suggestive of at least partial main portal vein patency.  Today, 12/12/19  APTT = 95 seconds is therapeutic on heparin infusion of 1200 units/hr  HL (1.44) remains falsely elevated due to recent  DOAC  CBC: Hgb slightly low but stable; Plt WNL  Confirmed with RN that heparin infusing at correct rate. No interruptions. No signs of bleeding.  Goal of Therapy:  Heparin level 0.3-0.7 units/ml aPTT 66-102 seconds Monitor platelets by anticoagulation protocol: Yes   Plan:   Continue heparin infusion at current rate of 1200 units/hr  Check confirmatory aPTT in 6 hours  Daily CBC, aPTT/HL. Once aPTT and HL correlate, can transition to monitoring using HL only  Monitor for signs of bleeding  Follow for anticoagulation plan - likely transition to either LMWH or warfarin with bridge  MLenis Noon PharmD 12/12/19 8:13 AM  Addendum:  Pharmacy consulted to transition anticoagulation from heparin drip to therapeutic LMWH.   Assessment: -SCr = 1. CrCl ~80 mL/min -TBW = 87 kg  Plan: -Discontinue heparin drip now -Initiate LMWH 1 mg/kg subQ q12h. First dose to be given 1 hour after heparin discontinued -Enoxaparin patient education kit ordered -Check CBC with AM labs tomorrow  [facility]  Shelda Jakes, PharmD 12/12/19 11:08 AM

## 2019-12-12 NOTE — Progress Notes (Signed)
Pt discharged with spouse in wheelchair to the lobby by Probation officer. Both PIV discharged. Telebox remove, noted. Pt alert and oriented times 4 and stable, noted.

## 2019-12-13 LAB — FACTOR 5 LEIDEN

## 2019-12-14 ENCOUNTER — Other Ambulatory Visit: Payer: Self-pay | Admitting: Radiology

## 2019-12-14 DIAGNOSIS — I81 Portal vein thrombosis: Secondary | ICD-10-CM

## 2019-12-19 ENCOUNTER — Other Ambulatory Visit: Payer: Self-pay | Admitting: Interventional Radiology

## 2019-12-19 DIAGNOSIS — I81 Portal vein thrombosis: Secondary | ICD-10-CM

## 2019-12-19 LAB — JAK2  V617F QUAL. WITH REFLEX TO EXON 12: Reflex:: 15

## 2019-12-19 LAB — JAK2 EXONS 12-15

## 2019-12-27 ENCOUNTER — Ambulatory Visit (HOSPITAL_COMMUNITY): Payer: BC Managed Care – PPO

## 2020-01-02 ENCOUNTER — Other Ambulatory Visit: Payer: Self-pay | Admitting: Interventional Radiology

## 2020-01-02 ENCOUNTER — Ambulatory Visit
Admission: RE | Admit: 2020-01-02 | Discharge: 2020-01-02 | Disposition: A | Discharge status: Home / Self Care | Payer: Self-pay | Source: Ambulatory Visit | Attending: Interventional Radiology | Admitting: Interventional Radiology

## 2020-01-02 DIAGNOSIS — I81 Portal vein thrombosis: Secondary | ICD-10-CM

## 2020-01-03 ENCOUNTER — Ambulatory Visit
Admission: RE | Admit: 2020-01-03 | Discharge: 2020-01-03 | Disposition: A | Discharge status: Home / Self Care | Payer: Self-pay | Source: Ambulatory Visit | Attending: Interventional Radiology | Admitting: Interventional Radiology

## 2020-01-03 ENCOUNTER — Encounter: Payer: Self-pay | Admitting: *Deleted

## 2020-01-03 ENCOUNTER — Ambulatory Visit (HOSPITAL_COMMUNITY)
Admission: RE | Admit: 2020-01-03 | Discharge: 2020-01-03 | Disposition: A | Discharge status: Home / Self Care | Payer: BC Managed Care – PPO | Source: Ambulatory Visit | Attending: Interventional Radiology | Admitting: Interventional Radiology

## 2020-01-03 ENCOUNTER — Other Ambulatory Visit: Payer: Self-pay

## 2020-01-03 ENCOUNTER — Other Ambulatory Visit: Payer: Self-pay | Admitting: Radiology

## 2020-01-03 ENCOUNTER — Ambulatory Visit
Admission: RE | Admit: 2020-01-03 | Discharge: 2020-01-03 | Disposition: A | Discharge status: Home / Self Care | Payer: BC Managed Care – PPO | Source: Ambulatory Visit | Attending: Radiology | Admitting: Radiology

## 2020-01-03 ENCOUNTER — Other Ambulatory Visit: Payer: Self-pay | Admitting: Interventional Radiology

## 2020-01-03 ENCOUNTER — Other Ambulatory Visit: Payer: BC Managed Care – PPO

## 2020-01-03 DIAGNOSIS — G8918 Other acute postprocedural pain: Secondary | ICD-10-CM

## 2020-01-03 DIAGNOSIS — I81 Portal vein thrombosis: Secondary | ICD-10-CM | POA: Insufficient documentation

## 2020-01-03 HISTORY — PX: IR RADIOLOGIST EVAL & MGMT: IMG5224

## 2020-01-03 LAB — POCT I-STAT CREATININE: Creatinine, Ser: 1.1 mg/dL (ref 0.61–1.24)

## 2020-01-03 MED ORDER — IOHEXOL 350 MG/ML SOLN
100.0000 mL | Freq: Once | INTRAVENOUS | Status: AC | PRN
  Administered 2020-01-03: 100 mL via INTRAVENOUS

## 2020-01-03 NOTE — Progress Notes (Signed)
Referring Physician(s): Omohundro,Jennifer C  Chief Complaint: The patient is seen in telephone follow up today for history of thrombosed TIPS placed in the setting of portal vein/SMV thrombus at outside facility  History of present illness: Harry Lucero continues to endorse abdominal pain, which fluctuates in intensity, between 4-7/10.  The pain is mostly in the upper abdomen.  He endorses some transient nausea, no vomiting.  There is no specific time of day the pain is worse.  He is eating well without recurring post-prandial pain.  He denies fevers, chills, shortness of breath, change in bowel habits, jaundice, or confusion.  He continues to take therapeutic lovenox.  He had a multiphase CT abdomen/pelvis today.  He is currently on temporary disability from work until due to this condition.  He states that he has a planned endoscopy coming up.    Past Medical History:  Diagnosis Date  . Portal vein thrombosis     Past Surgical History:  Procedure Laterality Date  . TIPS PROCEDURE      Allergies: Patient has no known allergies.  Medications: Prior to Admission medications   Medication Sig Start Date End Date Taking? Authorizing Provider  enoxaparin (LOVENOX) 100 MG/ML injection Inject 0.9 mLs (90 mg total) into the skin 2 (two) times daily. 12/12/19 01/11/20  Barb Merino, MD  traMADol (ULTRAM) 50 MG tablet Take 50 mg by mouth every 4 (four) hours as needed. 11/22/19   [provider]  Vitamin D, Ergocalciferol, (DRISDOL) 1.25 MG (50000 UNIT) CAPS capsule Take 50,000 Units by mouth once a week. 11/22/19   [provider]  ranitidine (ZANTAC) 150 MG tablet Take 1 tablet (150 mg total) by mouth 2 (two) times daily. Patient not taking: Reported on 12/11/2019 03/19/18 12/11/19  Muthersbaugh, Jarrett Soho, PA-C     No family history on file.  Social History   Socioeconomic History  . Marital status: Married    Spouse name: Not on file  . Number of children: Not on  file  . Years of education: Not on file  . Highest education level: Not on file  Occupational History  . Not on file  Tobacco Use  . Smoking status: Never Smoker  . Smokeless tobacco: Never Used  Substance and Sexual Activity  . Alcohol use: No  . Drug use: No  . Sexual activity: Not on file  Other Topics Concern  . Not on file  Social History Narrative  . Not on file   Social Determinants of Health   Financial Resource Strain:   . Difficulty of Paying Living Expenses: Not on file  Food Insecurity:   . Worried About Charity fundraiser in the Last Year: Not on file  . Ran Out of Food in the Last Year: Not on file  Transportation Needs:   . Lack of Transportation (Medical): Not on file  . Lack of Transportation (Non-Medical): Not on file  Physical Activity:   . Days of Exercise per Week: Not on file  . Minutes of Exercise per Session: Not on file  Stress:   . Feeling of Stress : Not on file  Social Connections:   . Frequency of Communication with Friends and Family: Not on file  . Frequency of Social Gatherings with Friends and Family: Not on file  . Attends Religious Services: Not on file  . Active Member of Clubs or Organizations: Not on file  . Attends Archivist Meetings: Not on file  . Marital Status: Not on file  Vital Signs: There were no vitals taken for this visit.  Physical Exam Vitals reviewed: Not obtained due to telephone visit.     Imaging: CT AP, [facility] 10/31/19, pre-TIPS: Engorged main portal vein, SMV with acute thrombus, some cavernous transformation suggestive of acute on chronic thrombus   IR TIPS creation, [facility] 10/31/19: Middle hepatic to portal confluence, 5+2cm Viatorr Overnight lysis initiated through pigtail catheter, native SMV never visualized fluoroscopically   IR lysis follow up, [facility], 11/01/19: S/p angioplasty and thrombectomy of TIPS stent and main PV   CT AP (01/03/20): Persistently occluded TIPS from  with some cavernous transformation at porta hepatis.  Prominent mesenteric portal collaterals with persistent occlusion of main portal vein and SMV.       Labs:  CBC: Recent Labs    12/10/19 2205 12/12/19 0607  WBC 4.1 4.8  HGB 11.4* 12.3*  HCT 35.7* 37.8*  PLT 256 294    COAGS: Recent Labs    12/11/19 0937 12/11/19 1540 12/11/19 2204 12/12/19 0607  INR 1.2  --   --   --   APTT 32 90* 109* 95*    BMP: Recent Labs    12/10/19 2205 12/12/19 0607 01/03/20 0837  NA 142 136  --   K 4.2 3.9  --   CL 111 104  --   CO2 23 23  --   GLUCOSE 113* 94  --   BUN 15 12  --   CALCIUM 9.5 9.2  --   CREATININE 1.04 1.00 1.10  GFRNONAA >60 >60  --     LIVER FUNCTION TESTS: Recent Labs    12/10/19 2205 12/12/19 0607  BILITOT 0.9 1.3*  AST 62* 32  ALT 71* 54*  ALKPHOS 92 86  PROT 7.2 7.1  ALBUMIN 3.7 3.7    Assessment and Plan: Harry Lucero is a 64 year old gentleman with no past medical history who presented to an outside hospital [place], [place]) while traveling in September 2021 due abdominal pain and was found to have portal and SMV thrombus.  He underwent arterial directed thrombolysis via the SMA which was unsuccessful, followed by TIPS creation and direct main portal vein thrombolysis, angioplasty, and thrombectomy.  Final images from outside studies demonstrate partially occluded TIPS stent, patent main portal vein with multiple upper abdominal collaterals, and persistently occluded SMV.  He presented to [facility] ED for similar abdominal pain in med October, and was discharged home on 12/12/19 on Lovenox with eventual plans to bridge to warfarin due to positive APS antibody, per Hematology.  Immediate intervention was not pursued as outside imaging was not available at the time, and in-house imaging was suboptimal in its evaluation of the portal system.  Now that outside imaging has arrived and been reviewed, repeat CT has been performed today, and the patient  has been evaluated, he would likely benefit from at least attempting TIPS recanalization.  It is likely that the etiology of portal thrombus is related to his newly-discovered antiphospholipid syndrome.  He has no imaging characteristics of cirrhosis, and no abdominopelvic imaging findings that would suggest underlying malignancy.  Additionally, he has shown no prior symptoms or sequela of portal hypertension.  At this point, he has begun to collateralize his otherwise occluded portal flow.  There remains a visible main portal vein and central SMV on CT, which can be targeted for recanalization.  We discussed the risks and benefits of recanalization in this complex scenario, and main benefit to be pain relief.  We discussed  that if the pain is tolerable, the procedure is not required as the body has formed collaterals, however these are likely insufficient to return portal blood to the liver as effectively as the native portal system, and likely the etiology of his pain.  We also discussed the risk of hepatic encephalopathy after recanalization.  He is amenable to proceed.   -plan for TIPS recanalization as soon as possible on outpatient basis -procedurally will plan for TIPS recanalization, thrombectomy/pharmacomechanical thrombolysis, angioplasty, likely relining of indwelling TIPS stent -will require general anesthesia -supine positioning, right IJ access -labs on day of procedure to include CBC, CMP, INR, type and screen -plan for overnight admission with Hematology consultation after procedure to develop long-term anticoagulation plan -plan to hold Lovenox 24 hours prior to procedure   Electronically Signed: Rosanne Ashing Julies Carmickle 01/03/2020, 12:01 PM   I spent a total of 40 Minutes in face to face in clinical consultation, greater than 50% of which was counseling/coordinating care for TIPS recanalization.

## 2020-01-04 ENCOUNTER — Other Ambulatory Visit (HOSPITAL_COMMUNITY): Payer: Self-pay | Admitting: Interventional Radiology

## 2020-01-04 DIAGNOSIS — I81 Portal vein thrombosis: Secondary | ICD-10-CM

## 2020-01-06 ENCOUNTER — Other Ambulatory Visit: Payer: Self-pay | Admitting: Student

## 2020-01-06 ENCOUNTER — Encounter (HOSPITAL_COMMUNITY): Payer: Self-pay

## 2020-01-06 NOTE — Progress Notes (Signed)
PCP:  Tobie Lords, MD Cardiologist:  Denies  EKG:  12/11/19 CXR:  06/30/11 ECHO:  Denies Stress Test:  Denies Cardiac Cath:  Denies  Fasting Blood Sugar-  N/A Checks Blood Sugar__N/A_ times a day  OSA/CPAP:  No  ASA/Blood Thinners:  Hold Lovenox 24 hours prior to surgery per 01/03/20 progress note.  Covid test 01/07/20  Anesthesia Review:  No  Patient denies shortness of breath, fever, cough, and chest pain at PAT appointment.  Patient verbalized understanding of instructions provided today at the PAT appointment.  Patient asked to review instructions at home and day of surgery.

## 2020-01-07 ENCOUNTER — Other Ambulatory Visit (HOSPITAL_COMMUNITY)
Admission: RE | Admit: 2020-01-07 | Discharge: 2020-01-07 | Disposition: A | Discharge status: Home / Self Care | Payer: BC Managed Care – PPO | Source: Ambulatory Visit | Attending: Interventional Radiology | Admitting: Interventional Radiology

## 2020-01-07 DIAGNOSIS — Z01812 Encounter for preprocedural laboratory examination: Secondary | ICD-10-CM | POA: Diagnosis not present

## 2020-01-07 DIAGNOSIS — Z20822 Contact with and (suspected) exposure to covid-19: Secondary | ICD-10-CM | POA: Diagnosis not present

## 2020-01-07 LAB — SARS CORONAVIRUS 2 (TAT 6-24 HRS): SARS Coronavirus 2: NEGATIVE

## 2020-01-09 ENCOUNTER — Encounter (HOSPITAL_COMMUNITY): Payer: Self-pay

## 2020-01-09 ENCOUNTER — Encounter (HOSPITAL_COMMUNITY)
Admission: RE | Disposition: A | Discharge status: Home / Self Care | Payer: Self-pay | Source: Home / Self Care | Attending: Interventional Radiology

## 2020-01-09 ENCOUNTER — Other Ambulatory Visit: Payer: Self-pay

## 2020-01-09 ENCOUNTER — Observation Stay (HOSPITAL_COMMUNITY)
Admission: RE | Admit: 2020-01-09 | Discharge: 2020-01-10 | Disposition: A | Discharge status: Home / Self Care | Payer: BC Managed Care – PPO | Attending: Interventional Radiology | Admitting: Interventional Radiology

## 2020-01-09 ENCOUNTER — Inpatient Hospital Stay: Payer: BC Managed Care – PPO | Admitting: Hematology and Oncology

## 2020-01-09 ENCOUNTER — Ambulatory Visit (HOSPITAL_COMMUNITY)
Admission: RE | Admit: 2020-01-09 | Discharge: 2020-01-09 | Disposition: A | Discharge status: Home / Self Care | Payer: BC Managed Care – PPO | Source: Ambulatory Visit | Attending: Interventional Radiology | Admitting: Interventional Radiology

## 2020-01-09 ENCOUNTER — Ambulatory Visit (HOSPITAL_COMMUNITY): Payer: BC Managed Care – PPO | Admitting: Anesthesiology

## 2020-01-09 DIAGNOSIS — I81 Portal vein thrombosis: Secondary | ICD-10-CM | POA: Diagnosis not present

## 2020-01-09 HISTORY — PX: RADIOLOGY WITH ANESTHESIA: SHX6223

## 2020-01-09 HISTORY — PX: IR TIPS REVISION MOD SED: IMG2296

## 2020-01-09 LAB — CBC
HCT: 36.3 % — ABNORMAL LOW (ref 39.0–52.0)
HCT: 38.9 % — ABNORMAL LOW (ref 39.0–52.0)
Hemoglobin: 11.7 g/dL — ABNORMAL LOW (ref 13.0–17.0)
Hemoglobin: 12.5 g/dL — ABNORMAL LOW (ref 13.0–17.0)
MCH: 27 pg (ref 26.0–34.0)
MCH: 26.7 pg (ref 26.0–34.0)
MCHC: 32.2 g/dL (ref 30.0–36.0)
MCHC: 32.1 g/dL (ref 30.0–36.0)
MCV: 83.8 fL (ref 80.0–100.0)
MCV: 82.9 fL (ref 80.0–100.0)
Platelets: 285 10*3/uL (ref 150–400)
Platelets: 282 10*3/uL (ref 150–400)
RBC: 4.33 MIL/uL (ref 4.22–5.81)
RBC: 4.69 MIL/uL (ref 4.22–5.81)
RDW: 16.2 % — ABNORMAL HIGH (ref 11.5–15.5)
RDW: 16.3 % — ABNORMAL HIGH (ref 11.5–15.5)
WBC: 4.1 10*3/uL (ref 4.0–10.5)
WBC: 8.3 10*3/uL (ref 4.0–10.5)
nRBC: 0 % (ref 0.0–0.2)
nRBC: 0 % (ref 0.0–0.2)

## 2020-01-09 LAB — COMPREHENSIVE METABOLIC PANEL
ALT: 146 U/L — ABNORMAL HIGH (ref 0–44)
AST: 62 U/L — ABNORMAL HIGH (ref 15–41)
Albumin: 3.4 g/dL — ABNORMAL LOW (ref 3.5–5.0)
Alkaline Phosphatase: 85 U/L (ref 38–126)
Anion gap: 9 (ref 5–15)
BUN: 15 mg/dL (ref 8–23)
CO2: 20 mmol/L — ABNORMAL LOW (ref 22–32)
Calcium: 9.2 mg/dL (ref 8.9–10.3)
Chloride: 104 mmol/L (ref 98–111)
Creatinine, Ser: 1.05 mg/dL (ref 0.61–1.24)
GFR, Estimated: >60 mL/min (ref >60)
Glucose, Bld: 101 mg/dL — ABNORMAL HIGH (ref 70–99)
Potassium: 4.6 mmol/L (ref 3.5–5.1)
Sodium: 133 mmol/L — ABNORMAL LOW (ref 135–145)
Total Bilirubin: 1 mg/dL (ref 0.3–1.2)
Total Protein: 6.6 g/dL (ref 6.5–8.1)

## 2020-01-09 LAB — PREPARE RBC (CROSSMATCH)

## 2020-01-09 LAB — PROTIME-INR
INR: 1 (ref 0.8–1.2)
Prothrombin Time: 12.5 seconds (ref 11.4–15.2)

## 2020-01-09 LAB — AMMONIA: Ammonia: 49 umol/L — ABNORMAL HIGH (ref 9–35)

## 2020-01-09 LAB — ABO/RH: ABO/RH(D): B POS

## 2020-01-09 SURGERY — IR WITH ANESTHESIA
Anesthesia: General

## 2020-01-09 MED ORDER — CHLORHEXIDINE GLUCONATE 0.12 % MT SOLN: 15.0000 mL | Freq: Once | OROMUCOSAL | Status: AC

## 2020-01-09 MED ORDER — FENTANYL CITRATE (PF) 100 MCG/2ML IJ SOLN
INTRAMUSCULAR | Status: AC
  Filled 2020-01-09: qty 4

## 2020-01-09 MED ORDER — ORAL CARE MOUTH RINSE: 15.0000 mL | Freq: Once | OROMUCOSAL | Status: AC

## 2020-01-09 MED ORDER — ROCURONIUM BROMIDE 10 MG/ML (PF) SYRINGE
PREFILLED_SYRINGE | INTRAVENOUS | Status: DC | PRN
  Administered 2020-01-09: 20 mg via INTRAVENOUS
  Administered 2020-01-09: 100 mg via INTRAVENOUS
  Administered 2020-01-09: 20 mg via INTRAVENOUS

## 2020-01-09 MED ORDER — LIDOCAINE-EPINEPHRINE 1 %-1:100000 IJ SOLN
INTRAMUSCULAR | Status: AC
  Filled 2020-01-09: qty 1

## 2020-01-09 MED ORDER — FENTANYL CITRATE (PF) 250 MCG/5ML IJ SOLN
INTRAMUSCULAR | Status: DC | PRN
  Administered 2020-01-09 (×2): 50 ug via INTRAVENOUS
  Administered 2020-01-09: 100 ug via INTRAVENOUS

## 2020-01-09 MED ORDER — PHENYLEPHRINE HCL-NACL 10-0.9 MG/250ML-% IV SOLN
INTRAVENOUS | Status: DC | PRN
  Administered 2020-01-09: 50 ug/min via INTRAVENOUS

## 2020-01-09 MED ORDER — SUGAMMADEX SODIUM 200 MG/2ML IV SOLN
INTRAVENOUS | Status: DC | PRN
  Administered 2020-01-09 (×2): 200 mg via INTRAVENOUS

## 2020-01-09 MED ORDER — ONDANSETRON HCL 4 MG/2ML IJ SOLN
INTRAMUSCULAR | Status: DC | PRN
  Administered 2020-01-09: 4 mg via INTRAVENOUS

## 2020-01-09 MED ORDER — OXYCODONE HCL 5 MG PO TABS: 5.0000 mg | ORAL_TABLET | ORAL | Status: DC | PRN

## 2020-01-09 MED ORDER — ONDANSETRON HCL 4 MG/2ML IJ SOLN: 4.0000 mg | Freq: Four times a day (QID) | INTRAMUSCULAR | Status: DC | PRN

## 2020-01-09 MED ORDER — MIDAZOLAM HCL 2 MG/2ML IJ SOLN
INTRAMUSCULAR | Status: DC | PRN
  Administered 2020-01-09: 2 mg via INTRAVENOUS

## 2020-01-09 MED ORDER — DEXAMETHASONE SODIUM PHOSPHATE 10 MG/ML IJ SOLN
INTRAMUSCULAR | Status: DC | PRN
  Administered 2020-01-09: 5 mg via INTRAVENOUS

## 2020-01-09 MED ORDER — PROPOFOL 10 MG/ML IV BOLUS
INTRAVENOUS | Status: DC | PRN
  Administered 2020-01-09: 150 mg via INTRAVENOUS

## 2020-01-09 MED ORDER — LIDOCAINE 2% (20 MG/ML) 5 ML SYRINGE
INTRAMUSCULAR | Status: DC | PRN
  Administered 2020-01-09: 80 mg via INTRAVENOUS

## 2020-01-09 MED ORDER — PIPERACILLIN-TAZOBACTAM 3.375 G IVPB 30 MIN
3.3750 g | Freq: Once | INTRAVENOUS | Status: AC
  Administered 2020-01-09: 3.375 g via INTRAVENOUS
  Filled 2020-01-09 (×2): qty 50

## 2020-01-09 MED ORDER — CHLORHEXIDINE GLUCONATE 0.12 % MT SOLN
OROMUCOSAL | Status: AC
  Administered 2020-01-09: 15 mL via OROMUCOSAL
  Filled 2020-01-09: qty 15

## 2020-01-09 MED ORDER — PROMETHAZINE HCL 25 MG/ML IJ SOLN: 6.2500 mg | INTRAMUSCULAR | Status: DC | PRN

## 2020-01-09 MED ORDER — OXYCODONE HCL 5 MG/5ML PO SOLN: 5.0000 mg | Freq: Once | ORAL | Status: DC | PRN

## 2020-01-09 MED ORDER — MIDAZOLAM HCL 2 MG/2ML IJ SOLN
INTRAMUSCULAR | Status: AC
  Filled 2020-01-09: qty 2

## 2020-01-09 MED ORDER — LACTATED RINGERS IV SOLN: INTRAVENOUS | Status: DC

## 2020-01-09 MED ORDER — PHENYLEPHRINE 40 MCG/ML (10ML) SYRINGE FOR IV PUSH (FOR BLOOD PRESSURE SUPPORT)
PREFILLED_SYRINGE | INTRAVENOUS | Status: DC | PRN
  Administered 2020-01-09 (×2): 80 ug via INTRAVENOUS

## 2020-01-09 MED ORDER — ACETAMINOPHEN 500 MG PO TABS: 1000.0000 mg | ORAL_TABLET | Freq: Four times a day (QID) | ORAL | Status: DC | PRN

## 2020-01-09 MED ORDER — OXYCODONE HCL 5 MG PO TABS: 5.0000 mg | ORAL_TABLET | Freq: Once | ORAL | Status: DC | PRN

## 2020-01-09 MED ORDER — LACTATED RINGERS IV SOLN: INTRAVENOUS | Status: DC | PRN

## 2020-01-09 MED ORDER — SODIUM CHLORIDE 0.9 % IV SOLN: INTRAVENOUS | Status: DC

## 2020-01-09 MED ORDER — FENTANYL CITRATE (PF) 100 MCG/2ML IJ SOLN: 25.0000 ug | INTRAMUSCULAR | Status: DC | PRN

## 2020-01-09 MED ORDER — IOHEXOL 300 MG/ML  SOLN: 150.0000 mL | Freq: Once | INTRAMUSCULAR | Status: DC | PRN

## 2020-01-09 MED ORDER — OXYCODONE HCL 5 MG PO TABS: 10.0000 mg | ORAL_TABLET | ORAL | Status: DC | PRN

## 2020-01-09 MED ORDER — GELATIN ABSORBABLE 12-7 MM EX MISC
CUTANEOUS | Status: AC
  Filled 2020-01-09: qty 1

## 2020-01-09 NOTE — Sedation Documentation (Signed)
CVP 12

## 2020-01-09 NOTE — Anesthesia Procedure Notes (Signed)
Arterial Line Insertion Start/End11/15/2021 7:50 AM Performed by: Janace Litten, CRNA, CRNA  Preanesthetic checklist: patient identified, IV checked, risks and benefits discussed, surgical consent, monitors and equipment checked and pre-op evaluation Lidocaine 1% used for infiltration Left, radial was placed Catheter size: 20 G Hand hygiene performed  and maximum sterile barriers used   Attempts: 1 Procedure performed without using ultrasound guided technique. Following insertion, dressing applied. Post procedure assessment: normal  Patient tolerated the procedure well with no immediate complications.

## 2020-01-09 NOTE — Anesthesia Postprocedure Evaluation (Signed)
Anesthesia Post Note  Patient: Harry Lucero  Procedure(s) Performed: IR WITH ANESTHESIA TIPS REVISION (N/A )     Patient location during evaluation: PACU Anesthesia Type: General Level of consciousness: awake Pain management: pain level controlled Vital Signs Assessment: post-procedure vital signs reviewed and stable Respiratory status: spontaneous breathing and respiratory function stable Cardiovascular status: stable Postop Assessment: no apparent nausea or vomiting Anesthetic complications: no   No complications documented.  Last Vitals:  Vitals:   01/09/20 1400 01/09/20 1430  BP: 135/89 (!) 147/99  Pulse: 79 85  Resp: 16 17  Temp: 36.4 C   SpO2: 96% 99%    Last Pain:  Vitals:   01/09/20 1430  TempSrc:   PainSc: 0-No pain                 Merlinda Frederick

## 2020-01-09 NOTE — Anesthesia Preprocedure Evaluation (Addendum)
Anesthesia Evaluation  Patient identified by MRN, date of birth, ID band Patient awake    Reviewed: Allergy & Precautions, NPO status , Patient's Chart, lab work & pertinent test results  Airway Mallampati: II  TM Distance: >3 FB Neck ROM: Full    Dental  (+) Missing Multiple missing teeth, none loose per patient:   Pulmonary neg pulmonary ROS,    Pulmonary exam normal breath sounds clear to auscultation       Cardiovascular Exercise Tolerance: Good Normal cardiovascular exam Rhythm:Regular Rate:Normal     Neuro/Psych negative neurological ROS  negative psych ROS   GI/Hepatic Elevated LfTS Portal vein thrombus   Endo/Other  negative endocrine ROS  Renal/GU negative Renal ROS  negative genitourinary   Musculoskeletal negative musculoskeletal ROS (+)   Abdominal   Peds negative pediatric ROS (+)  Hematology negative hematology ROS (+)   Anesthesia Other Findings   Reproductive/Obstetrics negative OB ROS                            Anesthesia Physical Anesthesia Plan  ASA: II  Anesthesia Plan: General   Post-op Pain Management:    Induction:   PONV Risk Score and Plan: 2 and Midazolam, Ondansetron and Dexamethasone  Airway Management Planned: Oral ETT  Additional Equipment: Arterial line  Intra-op Plan:   Post-operative Plan:   Informed Consent: I have reviewed the patients History and Physical, chart, labs and discussed the procedure including the risks, benefits and alternatives for the proposed anesthesia with the patient or authorized representative who has indicated his/her understanding and acceptance.     Dental advisory given  Plan Discussed with: CRNA and Anesthesiologist  Anesthesia Plan Comments: (2 PIV. +/- arterial line. GETA. )       Anesthesia Quick Evaluation

## 2020-01-09 NOTE — Transfer of Care (Signed)
Immediate Anesthesia Transfer of Care Note  Patient: Harry Lucero  Procedure(s) Performed: IR WITH ANESTHESIA TIPS REVISION (N/A )  Patient Location: PACU  Anesthesia Type:General  Level of Consciousness: drowsy, patient cooperative and responds to stimulation  Airway & Oxygen Therapy: Patient Spontanous Breathing  Post-op Assessment: Report given to RN and Post -op Vital signs reviewed and stable  Post vital signs: Reviewed and stable  Last Vitals:  Vitals Value Taken Time  BP 141/91 01/09/20 1247  Temp    Pulse 95 01/09/20 1252  Resp 23 01/09/20 1252  SpO2 93 % 01/09/20 1252  Vitals shown include unvalidated device data.  Last Pain:  Vitals:   01/09/20 0747  TempSrc:   PainSc: 7       Patients Stated Pain Goal: 2 (58/44/17 1278)  Complications: No complications documented.

## 2020-01-09 NOTE — Sedation Documentation (Signed)
CVP 9

## 2020-01-09 NOTE — Sedation Documentation (Signed)
CVP 14

## 2020-01-09 NOTE — Sedation Documentation (Addendum)
CVP 11

## 2020-01-09 NOTE — Sedation Documentation (Signed)
CVP 10 

## 2020-01-09 NOTE — Sedation Documentation (Signed)
Procedure aborted

## 2020-01-09 NOTE — Sedation Documentation (Signed)
CVP 13

## 2020-01-09 NOTE — Sedation Documentation (Signed)
CVP 11

## 2020-01-09 NOTE — Sedation Documentation (Signed)
CVP 8

## 2020-01-09 NOTE — Anesthesia Procedure Notes (Signed)
Procedure Name: Intubation Date/Time: 01/09/2020 9:19 AM Performed by: Janace Litten, CRNA Pre-anesthesia Checklist: Patient identified, Emergency Drugs available, Suction available and Patient being monitored Patient Re-evaluated:Patient Re-evaluated prior to induction Oxygen Delivery Method: Circle System Utilized Preoxygenation: Pre-oxygenation with 100% oxygen Induction Type: IV induction Ventilation: Mask ventilation without difficulty Laryngoscope Size: Mac and 4 Grade View: Grade I Tube type: Oral Tube size: 7.5 mm Number of attempts: 1 Airway Equipment and Method: Stylet Placement Confirmation: ETT inserted through vocal cords under direct vision,  positive ETCO2 and breath sounds checked- equal and bilateral Secured at: 20 cm Tube secured with: Tape Dental Injury: Teeth and Oropharynx as per pre-operative assessment

## 2020-01-09 NOTE — Procedures (Addendum)
Interventional Radiology Procedure Note  Procedure:  1) Ultrasound guided right IJ access 2) Central manometry 3) Ultrasound guided percutaneous splenic vein access  Findings: Please refer to procedural dictation for full description. Unsuccessful attempt at retrograde cannulation of TIPS stent.  Unsuccessful attempt at trans-splenic antegrade splenic vein access.  Procedure terminated after multiple attempts by myself and Dr. Earleen Newport, fluoro time, etc.  Splenic access gelfoam track embolization.  Complications: None immediate.  Estimated Blood Loss: <10 mL  Recommendations: 4 hours strict bedrest Admit overnight for observation Q6H CBC given multiple splenic access points Hematology consultation to establish plan for anticoagulation Advance diet as tolerated Hopeful discharge home tomorrow   Ruthann Cancer, MD Pager: [phone]

## 2020-01-09 NOTE — Progress Notes (Signed)
[name] feeling well after procedure today.  No nausea, vomiting, increased abdominal pain.  No pain at right IJ or left abdominal access sites. Tolerating diet  Vitals:   01/09/20 1600 01/09/20 1630  BP: (!) 145/98 (!) 142/94  Pulse: 96 96  Resp: 19 18  Temp: 97.6 F (36.4 C) 98.1 F (36.7 C)  SpO2: 99% 98%    Alert, oriented, non-distressed.  Room air.  Abd soft, non-distended.  Right IJ and left abdominal dressings clean dry and intact.       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 Underwent unsuccessful TIPS recanalization from both IJ and trans-splenic approach today under general anesthesia.    Appreciate Hematology evaluation and recommendations.  Plan to discharge home tomorrow morning if stable once recommendations are received.    [name] and I discussed at length prior to the procedure that we would attempt to recanalize the TIPS and portal vein today to help relieve his pain.  The pain has not gotten worse since he was found to have TIPS occlusion and re-thrombosis of the PV, and has not significantly limited his life as he still eats, ambulates, doesn't not require pain medication, etc.  He has formed portal collaterals and demonstrates no signs of portal hypertension.  Will re-assess his situation in 2 months in clinic with CTV abdomen and pelvis, continuing anticoagulation at very least until that time.     Ruthann Cancer, MD Pager: [phone]

## 2020-01-09 NOTE — Sedation Documentation (Addendum)
CVP 8

## 2020-01-09 NOTE — H&P (Signed)
Chief Complaint: Patient was seen in consultation today for Transjugular intrahepatic portal system shunt revision with possible portal vein recanalization and transplenic access at the request of Dr Ledell Peoples  Referring Physician(s): Dr Barb Merino  Supervising Physician: Dr Ruthann Cancer  Patient Status: [name]  History of Present Illness: Harry Lucero is a 64 y.o. male    Was seen in ED [facility] 12/11/19 for increasing abd pain-- epigastric area. Some nausea and diarrhea Eating well; normal BMs Denies jaundice ; confusion; fever or chills Found to have occluded TIPS and antiphospholipid antibody.  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  He underwent arterial directed thrombolysis via the SMA which was unsuccessful, followed by TIPS creation and direct main portal vein thrombolysis, angioplasty, and thrombectomy.  Final images from outside studies demonstrate partially occluded TIPS stent, patent main portal vein with multiple upper abdominal collaterals, and persistently occluded SMV.  He presented to [facility] for similar abdominal pain in med October, and was discharged home on 12/12/19 on Lovenox with eventual plans to bridge to warfarin due to positive APS antibody, per Hematology.  Immediate intervention was not pursued as outside imaging was not available at the time, and in-house imaging was suboptimal in its evaluation of the portal system. Now that outside imaging has arrived and been reviewed, repeat CT has been performed today, and the patient has been evaluated, he would likely benefit from at least attempting TIPS  recanalization. It is likely that the etiology of portal thrombus is related to his newly-discovered antiphospholipid syndrome.  He has no imaging characteristics of cirrhosis, and no abdominopelvic imaging findings that would suggest underlying malignancy. At this point, he has begun to collateralize his otherwise occluded portal flow.  There remains a visible main portal vein and central SMV on CT, which can be targeted for recanalization. We discussed the risks and benefits of recanalization in this complex scenario, and main benefit to be pain relief.  We discussed that if the pain is tolerable, the procedure is not required as the body has formed collaterals, however these are likely insufficient to return portal blood to the liver as effectively as the native portal system, and likely the etiology of his pain.  We also discussed the risk of hepatic encephalopathy after recanalization.  He is amenable to proceed.  -plan for TIPS recanalization as soon as possible on outpatient basis -procedurally will plan for TIPS recanalization, thrombectomy/pharmacomechanical thrombolysis, angioplasty, likely relining of indwelling TIPS stent -will require general anesthesia -supine positioning, right IJ access -labs on day of procedure to include CBC, CMP, INR, type and screen -plan for overnight admission with Hematology consultation after procedure to develop long-term anticoagulation plan -plan to hold Lovenox 24 hours prior to procedure  Scheduled now for TIPS revision with possible portal vein recanalization with transplenic access with Dr Serafina Royals in IR   Past Medical History:  Diagnosis Date  . Portal vein thrombosis     Past Surgical History:  Procedure Laterality Date  . IR RADIOLOGIST EVAL & MGMT  01/03/2020  . TIPS PROCEDURE      Allergies: Patient has no known allergies.  Medications: Prior to  Admission medications   Medication Sig Start Date End Date Taking? Authorizing Provider    enoxaparin (LOVENOX) 100 MG/ML injection Inject 0.9 mLs (90 mg total) into the skin 2 (two) times daily. 12/12/19 01/11/20  Barb Merino, MD  Vitamin D, Ergocalciferol, (DRISDOL) 1.25 MG (50000 UNIT) CAPS capsule Take 50,000 Units by mouth every Wednesday.  11/22/19   [provider]  ranitidine (ZANTAC) 150 MG tablet Take 1 tablet (150 mg total) by mouth 2 (two) times daily. Patient not taking: Reported on 12/11/2019 03/19/18 12/11/19  Muthersbaugh, Jarrett Soho, PA-C     History reviewed. No pertinent family history.  Social History   Socioeconomic History  . Marital status: Married    Spouse name: Not on file  . Number of children: Not on file  . Years of education: Not on file  . Highest education level: Not on file  Occupational History  . Not on file  Tobacco Use  . Smoking status: Never Smoker  . Smokeless tobacco: Never Used  Substance and Sexual Activity  . Alcohol use: No  . Drug use: No  . Sexual activity: Not on file  Other Topics Concern  . Not on file  Social History Narrative  . Not on file   Social Determinants of Health   Financial Resource Strain:   . Difficulty of Paying Living Expenses: Not on file  Food Insecurity:   . Worried About Charity fundraiser in the Last Year: Not on file  . Ran Out of Food in the Last Year: Not on file  Transportation Needs:   . Lack of Transportation (Medical): Not on file  . Lack of Transportation (Non-Medical): Not on file  Physical Activity:   . Days of Exercise per Week: Not on file  . Minutes of Exercise per Session: Not on file  Stress:   . Feeling of Stress : Not on file  Social Connections:   . Frequency of Communication with Friends and Family: Not on file  . Frequency of Social Gatherings with Friends and Family: Not on file  . Attends Religious Services: Not on file  . Active Member of Clubs or Organizations: Not on file  . Attends Archivist Meetings: Not on file  . Marital Status: Not on  file    Review of Systems: A 12 point ROS discussed and pertinent positives are indicated in the HPI above.  All other systems are negative.  Review of Systems  Constitutional: Positive for activity change and appetite change. Negative for fatigue, fever and unexpected weight change.  Respiratory: Negative for cough and shortness of breath.   Cardiovascular: Negative for chest pain.  Gastrointestinal: Positive for abdominal pain. Negative for diarrhea, nausea and vomiting.  Neurological: Negative for weakness.  Psychiatric/Behavioral: Negative for behavioral problems and confusion.    Vital Signs: There were no vitals taken for this visit.  Physical Exam Vitals reviewed.  HENT:     Mouth/Throat:     Mouth: Mucous membranes are moist.  Cardiovascular:     Rate and Rhythm: Normal rate and regular rhythm.     Heart sounds: Normal heart sounds.  Pulmonary:     Effort: Pulmonary effort is normal.     Breath sounds: Normal breath sounds.  Abdominal:     Palpations: Abdomen is soft.     Tenderness: There is no abdominal tenderness.  Musculoskeletal:        General: Normal range of motion.  Skin:    General: Skin is warm.  Neurological:  Mental Status: He is oriented to person, place, and time.  Psychiatric:        Behavior: Behavior normal.        Thought Content: Thought content normal.        Judgment: Judgment normal.     Imaging: CT ABDOMEN PELVIS W WO CONTRAST  Result Date: 01/03/2020 CLINICAL DATA:  64 year old male with history of TIPS creation in September 2021 at outside facility for portal vein thrombus. EXAM: CT ABDOMEN AND PELVIS WITHOUT AND WITH CONTRAST TECHNIQUE: Multidetector CT imaging of the abdomen and pelvis was performed using the standard protocol after bolus administration of intravenous contrast. Delayed imaging at 90 seconds was also obtained. multiplanar reconstructed images and MIPs were obtained and reviewed to evaluate the vascular anatomy.  CONTRAST:  185mL OMNIPAQUE IOHEXOL 350 MG/ML SOLN COMPARISON:  CT abdomen pelvis from 12/11/2019 and MR abdomen from 12/11/2019 FINDINGS: VASCULAR IVC: Normal caliber and patent.  No variant anatomy. Renal veins: Patent bilaterally. Iliac veins: The bilateral common, external, and internal iliac veins are patent. Femoral veins: The visualized bilateral common and central aspects of the bilateral superficial and profunda veins are patent. Portal system: The indwelling TIPS stent extending from the main hepatic vein through the middle hepatic vein into the portal confluence remains occluded. There is persistent left and right portal vein filling. The main portal vein and central aspect of the superior mesenteric vein are occluded. There is a prominent network of patent collateral veins within the porta hepatis and prominent, tortuous mesenteric portal veins visualized in the mid to lower abdomen. The IMV is not definitively visualized. The splenic vein is patent peripherally but occluded centrally near the portal confluence. Arterial system: Normal caliber abdominal aorta. No significant atherosclerotic changes. The visceral ostia and proximal branches are widely patent without significant stenosis. Review of the MIP images confirms the above findings. NON-VASCULAR Lower chest: Unchanged pleural based solid pulmonary nodule in the right posterior lower lobe measuring up to 6 mm. The lungs are otherwise clear. The heart is normal in size. No pericardial effusion. Hepatobiliary: The liver is normal in size, contour, and attenuation. The gallbladder is present, nondistended and contains a few dependent partially calcified gallstones. No intra or extrahepatic biliary ductal dilation. Pancreas: Similar appearing mild central ductal dilation measuring up to 4 mm in diameter in the pancreatic head, attributed to pancreatic divisum noted on recent comparison MR. No pancreatic masses are identified. Spleen: Normal in size  without focal abnormality. Adrenals/Urinary Tract: Adrenal glands are unremarkable. Kidneys are normal, without renal calculi, focal lesion, or hydronephrosis. Bladder is unremarkable. Stomach/Bowel: Stomach is within normal limits. Appendix appears normal. Scattered colonic diverticula without surrounding inflammatory changes. No evidence of bowel wall thickening, distention, or inflammatory changes. Vascular/Lymphatic: No abdominopelvic lymphadenopathy. Reproductive: The prostate is enlarged measuring up to 6 cm in axial diameter. Other: No abdominal wall hernia or abnormality. No abdominopelvic ascites. Musculoskeletal: Unchanged sclerotic focus in the medial aspect of the right iliac ala, favored represent a bone island. No acute osseous abnormality. IMPRESSION: VASCULAR 1. Persistent occlusion of the indwelling TIPS stent. 2. Multiple prominent mesenteric portal collateral veins and a network of cavernous transformation in the porta hepatis. The native main portal and central superior mesenteric vein remain occluded, but visible. NON-VASCULAR 1. No ascites, splenomegaly, or additional secondary signs of portal hypertension. 2. Colonic diverticulosis without evidence of diverticulitis. 3. Cholelithiasis without evidence of cholecystitis. 4. Unchanged 6 mm solid pulmonary nodule in the posterior right lower lobe. Non-contrast chest CT at 6-12  months is recommended. If the nodule is stable at time of repeat CT, then future CT at 18-24 months (from today's scan) is considered optional for low-risk patients, but is recommended for high-risk patients. This recommendation follows the consensus statement: Guidelines for Management of Incidental Pulmonary Nodules Detected on CT Images: From the Fleischner Society 2017; Radiology 2017; 284:228-243. 5. Prostatomegaly. Ruthann Cancer, MD [facility] Radiology Electronically Signed   By: Ruthann Cancer MD   On: 01/03/2020 12:53     MR Abdomen W or Wo Contrast  Result Date: 12/11/2019 CLINICAL DATA:  Suspected occluded TIPS, evaluate portal vein thrombosis EXAM: MRI ABDOMEN WITHOUT AND WITH CONTRAST TECHNIQUE: Multiplanar multisequence MR imaging of the abdomen was performed both before and after the administration of intravenous contrast. CONTRAST:  22mL GADAVIST GADOBUTROL 1 MMOL/ML IV SOLN COMPARISON:  Hepatic Doppler dated 12/11/2019. CTA abdomen/pelvis dated 12/11/2019. FINDINGS: Motion degraded images. Lower chest: Lung bases are clear. Hepatobiliary: Suspected cirrhotic configuration with enlargement of the caudate. No suspicious/enhancing hepatic lesions. No hepatic steatosis. Gallbladder is unremarkable. No intrahepatic or extrahepatic ductal dilatation. Pancreas: Mildly prominent pancreatic duct with suspected pancreatic divisum, possibly reflecting sequela of prior/chronic pancreatitis. No pancreatic mass, atrophy, or peripancreatic inflammatory changes. Spleen:  Within normal limits. Adrenals/Urinary Tract:  Adrenal glands are within normal limits. Kidneys are within normal limits.  No hydronephrosis. Stomach/Bowel: Stomach and visualized bowel are unremarkable. Vascular/Lymphatic: Status post TIPS with occlusion. Occluded portal vein with collaterals in the porta hepatis (series 21/image 41). SMV remains patent (series 21/image 58). No suspicious abdominal lymphadenopathy. Other:  No abdominal ascites. Musculoskeletal: No focal osseous lesions. IMPRESSION: Motion degraded images. Status post TIPS with occlusion. Occluded portal vein with collaterals in the porta hepatis. SMV remains patent. Cirrhosis. No suspicious/enhancing hepatic lesions. No abdominal ascites. Spleen is normal in size. Suspected pancreatic divisum with mild prominence of the main (dorsal) pancreatic duct, possibly reflecting sequela of prior/chronic pancreatitis. No pancreatic mass is evident on MR. Electronically Signed   By: Julian Hy M.D.   On:  12/11/2019 11:28   US LIVER DOPPLER  Result Date: 12/11/2019 CLINICAL DATA:  64 year old male with history of portal vein thrombus status post tips creation in September 2021 in [place]. Presents to the emergency department with epigastric pain similar to prior portal occlusion. Concern for tip thrombosis on recent CT. EXAM: DUPLEX ULTRASOUND OF LIVER AND TIPS SHUNT TECHNIQUE: Color and duplex Doppler ultrasound was performed to evaluate the hepatic in-flow and out-flow vessels. COMPARISON:  CT abdomen pelvis from the same day FINDINGS: Portal Vein Velocities Main: Not visualized Right:  32.8 cm/sec Left: Not visualized TIPS Stent Velocities Proximal:  0 cm/sec Mid:  0 Distal:  0 cm/sec Hepatic Vein Velocities Right:  36 cm/sec Mid:  14 cm/sec Left:  15 cm/sec Splenic Vein: Patent and normal caliber with antegrade flow measuring up to 29 centimeters/second. Superior Mesenteric Vein: Not visualized. Hepatic Artery: Patent with flow measuring up to 42.4 centimeters/second IVC: Present and patent with normal respiratory phasicity. Spleen: 7.6 cm x 9.4 cm x 8.8 cm with a total volume of 326.2 cm^3 (411 cm^3 is upper limit normal) Portal Vein Occlusion/Thrombus: Yes Splenic Vein Occlusion/Thrombus: No Ascites: None Varices: None IMPRESSION: Complete occlusion of the indwelling TIPS stent graft. Incomplete sonographic evaluation of the main portal vein, however patency with antegrade flow in the right portal vein is suggestive of at least partial main portal vein patency. RECOMMENDATIONS: Consider multiphase MRI abdomen for further characterization of the extent of portal thrombus.  Consider Interventional Radiology, Hematology, and Gastroenterology consultations for evaluation for potential TIPS recanalization. These results were called by telephone at the time of interpretation on 12/11/2019 at 8:50 am to provider Dorie Rank, MD , who verbally acknowledged these results. Ruthann Cancer, MD [facility]  [facility]iology [facility] Radiology Electronically Signed   By: Ruthann Cancer MD   On: 12/11/2019 09:00   IR Radiologist Eval & Mgmt  Result Date: 01/03/2020 Please refer to notes tab for details about interventional procedure. (Op Note)  CT Angio Abd/Pel W and/or Wo Contrast  Result Date: 12/11/2019 CLINICAL DATA:  64 year old male with history of portal vein thrombosis presenting with epigastric pain. EXAM: CTA ABDOMEN AND PELVIS WITHOUT AND WITH CONTRAST TECHNIQUE: Multidetector CT imaging of the abdomen and pelvis was performed using the standard protocol during bolus administration of intravenous contrast. Multiplanar reconstructed images and MIPs were obtained and reviewed to evaluate the vascular anatomy. CONTRAST:  120mL OMNIPAQUE IOHEXOL 350 MG/ML SOLN COMPARISON:  None. FINDINGS: VASCULAR Aorta: Normal caliber aorta without aneurysm, dissection, vasculitis or significant stenosis. Celiac: Patent without evidence of aneurysm, dissection, vasculitis or significant stenosis. SMA: Patent without evidence of aneurysm, dissection, vasculitis or significant stenosis. Renals: Both renal arteries are patent without evidence of aneurysm, dissection, vasculitis, fibromuscular dysplasia or significant stenosis. IMA: Patent without evidence of aneurysm, dissection, vasculitis or significant stenosis. Inflow: Patent without evidence of aneurysm, dissection, vasculitis or significant stenosis. Proximal Outflow: Bilateral common femoral and visualized portions of the superficial and profunda femoral arteries are patent without evidence of aneurysm, dissection, vasculitis or significant stenosis. Veins: The IVC is unremarkable. There is a TIPS in place. Evaluation of the TIPS is limited on this CT. There is however non opacification of the TIPS most consistent with provided history of shunt occlusion. Further evaluation of the TIPS with duplex ultrasound recommended. Review of the MIP images  confirms the above findings. NON-VASCULAR Lower chest: The visualized lung bases are clear. No intra-abdominal free air or free fluid. Hepatobiliary: The liver is unremarkable. No intrahepatic biliary dilatation. The gallbladder is unremarkable. Pancreas: There is mild dilatation of the main pancreatic duct measuring up to 6 mm in diameter. There is ill-defined fullness of the region of the head and uncinate process of the pancreas concerning for a pancreatic mass. Further characterization with MRI without and with contrast on a nonemergent/outpatient basis recommended. Spleen: Normal in size without focal abnormality. Adrenals/Urinary Tract: The adrenal glands unremarkable. There is no hydronephrosis on either side. There is symmetric enhancement and excretion of contrast by both kidneys. The visualized ureters appear unremarkable. The urinary bladder is predominantly collapsed. Stomach/Bowel: There is sigmoid diverticulosis and scattered colonic diverticula without active inflammatory changes. There is no bowel obstruction or active inflammation. The appendix is normal. Lymphatic: No adenopathy. There is mild haziness of the mesenteric fat which may represent infiltration or mild mesenteric panniculitis. Reproductive: The prostate gland is enlarged measuring approximately 6 cm in transverse axial diameter. Other: None Musculoskeletal: No acute or significant osseous findings. IMPRESSION: 1. Occluded appearance of the TIPS. Further evaluation of the TIPS with duplex ultrasound recommended. 2. Findings concerning for a mass in the region of the head/uncinate process of the pancreas. Further evaluation with MRI without and with contrast on a nonemergent/outpatient basis recommended. 3. Mild mesenteric stranding may represent panniculitis. Clinical correlation is recommended. 4. Colonic diverticulosis. No bowel obstruction. Normal appendix. 5. Enlarged prostate gland. Electronically Signed   By: Anner Crete M.D.    On: 12/11/2019 01:42  Labs:  CBC: Recent Labs    12/10/19 2205 12/12/19 0607  WBC 4.1 4.8  HGB 11.4* 12.3*  HCT 35.7* 37.8*  PLT 256 294    COAGS: Recent Labs    12/11/19 0937 12/11/19 1540 12/11/19 2204 12/12/19 0607  INR 1.2  --   --   --   APTT 32 90* 109* 95*    BMP: Recent Labs    12/10/19 2205 12/12/19 0607 01/03/20 0837  NA 142 136  --   K 4.2 3.9  --   CL 111 104  --   CO2 23 23  --   GLUCOSE 113* 94  --   BUN 15 12  --   CALCIUM 9.5 9.2  --   CREATININE 1.04 1.00 1.10  GFRNONAA >60 >60  --     LIVER FUNCTION TESTS: Recent Labs    12/10/19 2205 12/12/19 0607  BILITOT 0.9 1.3*  AST 62* 32  ALT 71* 54*  ALKPHOS 92 86  PROT 7.2 7.1  ALBUMIN 3.7 3.7    TUMOR MARKERS: No results for input(s): AFPTM, CEA, CA199, CHROMGRNA in the last 8760 hours.  Assessment and Plan:  TIPS revision with possible portal vein recanalization with transplenic access. Risks and benefits of TIPS revision were discussed with the patient and/or the patient's family including, but not limited to, infection, bleeding, damage to adjacent structures, worsening hepatic and/or cardiac function, worsening and/or the development of altered mental status/encephalopathy, non-target embolization and death.   This interventional procedure involves the use of X-rays and because of the nature of the planned procedure, it is possible that we will have prolonged use of X-ray fluoroscopy.  Potential radiation risks to you include (but are not limited to) the following: - A slightly elevated risk for cancer  several years later in life. This risk is typically less than 0.5% percent. This risk is low in comparison to the normal incidence of human cancer, which is 33% for women and 50% for men according to the [employer]. - Radiation induced injury can include skin redness, resembling a rash, tissue breakdown / ulcers and hair loss (which can be temporary or permanent).     The likelihood of either of these occurring depends on the difficulty of the procedure and whether you are sensitive to radiation due to previous procedures, disease, or genetic conditions.   IF your procedure requires a prolonged use of radiation, you will be notified and given written instructions for further action.  It is your responsibility to monitor the irradiated area for the 2 weeks following the procedure and to notify your physician if you are concerned that you have suffered a radiation induced injury.    All of the patient's questions were answered, patient is agreeable to proceed.  Pt is aware he may be admitted for overnight observation. Plan for discharge in am if stable He is agreeable  Consent signed and in chart.  Thank you for this interesting consult.  I greatly enjoyed meeting Harry Lucero and look forward to participating in their care.  A copy of this report was sent to the requesting provider on this date.  Electronically Signed: Lavonia Drafts, PA-C 01/09/2020, 7:38 AM   I spent a total of  30 Minutes   in face to face in clinical consultation, greater than 50% of which was counseling/coordinating care for TIPS revision and possible portal vein recanalization with transplenic access

## 2020-01-09 NOTE — Sedation Documentation (Signed)
CVP 18

## 2020-01-10 ENCOUNTER — Encounter (HOSPITAL_COMMUNITY): Payer: Self-pay | Admitting: Radiology

## 2020-01-10 ENCOUNTER — Inpatient Hospital Stay: Payer: BC Managed Care – PPO | Admitting: Hematology and Oncology

## 2020-01-10 DIAGNOSIS — I81 Portal vein thrombosis: Secondary | ICD-10-CM | POA: Diagnosis not present

## 2020-01-10 LAB — COMPREHENSIVE METABOLIC PANEL
ALT: 119 U/L — ABNORMAL HIGH (ref 0–44)
AST: 39 U/L (ref 15–41)
Albumin: 3.4 g/dL — ABNORMAL LOW (ref 3.5–5.0)
Alkaline Phosphatase: 77 U/L (ref 38–126)
Anion gap: 7 (ref 5–15)
BUN: 14 mg/dL (ref 8–23)
CO2: 26 mmol/L (ref 22–32)
Calcium: 9.9 mg/dL (ref 8.9–10.3)
Chloride: 104 mmol/L (ref 98–111)
Creatinine, Ser: 1.11 mg/dL (ref 0.61–1.24)
GFR, Estimated: >60 mL/min (ref >60)
Glucose, Bld: 127 mg/dL — ABNORMAL HIGH (ref 70–99)
Potassium: 4.9 mmol/L (ref 3.5–5.1)
Sodium: 137 mmol/L (ref 135–145)
Total Bilirubin: 0.8 mg/dL (ref 0.3–1.2)
Total Protein: 7 g/dL (ref 6.5–8.1)

## 2020-01-10 LAB — CBC
HCT: 36.8 % — ABNORMAL LOW (ref 39.0–52.0)
Hemoglobin: 11.9 g/dL — ABNORMAL LOW (ref 13.0–17.0)
MCH: 27 pg (ref 26.0–34.0)
MCHC: 32.3 g/dL (ref 30.0–36.0)
MCV: 83.4 fL (ref 80.0–100.0)
Platelets: 282 10*3/uL (ref 150–400)
RBC: 4.41 MIL/uL (ref 4.22–5.81)
RDW: 16.4 % — ABNORMAL HIGH (ref 11.5–15.5)
WBC: 7.4 10*3/uL (ref 4.0–10.5)
nRBC: 0 % (ref 0.0–0.2)

## 2020-01-10 NOTE — Progress Notes (Addendum)
Nsg Discharge Note  Patient educated on beginning new order of coumadin once a day in combination with lovenox therapy. PA Turpin delivered coumadin script to room. Patient voiced understanding.  Admit Date:  01/09/2020 Discharge date: 01/10/2020   [place] to be D/C'd Home per MD order.  AVS completed.  Copy for chart, and copy for patient signed, and dated. Patient/caregiver able to verbalize understanding.  Discharge Medication: Allergies as of 01/10/2020   No Known Allergies     Medication List    TAKE these medications   enoxaparin 100 MG/ML injection Commonly known as: LOVENOX Inject 0.9 mLs (90 mg total) into the skin 2 (two) times daily.   Vitamin D (Ergocalciferol) 1.25 MG (50000 UNIT) Caps capsule Commonly known as: DRISDOL Take 50,000 Units by mouth every Wednesday.            Discharge Care Instructions  (From admission, onward)         Start     Ordered   01/10/20 0000  Discharge wound care:       Comments: May shower today-- can change hospital bandages to bad aids after shower; keep clean band aids on areas x 7 days   01/10/20 1057          Discharge Assessment: Vitals:   01/09/20 2344 01/10/20 0528  BP: 113/81 129/90  Pulse: 92 76  Resp: 18 18  Temp: 98.4 F (36.9 C) 98.4 F (36.9 C)  SpO2: 98% 95%   Skin clean, dry and intact without evidence of skin break down, no evidence of skin tears noted. IV catheter discontinued intact. Site without signs and symptoms of complications - no redness or edema noted at insertion site, patient denies c/o pain - only slight tenderness at site.  Dressing with slight pressure applied.  D/c Instructions-Education: Discharge instructions given to patient/family with verbalized understanding. D/c education completed with patient/family including follow up instructions, medication list, d/c activities limitations if indicated, with other d/c instructions as indicated by MD - patient able to verbalize  understanding, all questions fully answered. Patient instructed to return to ED, call 911, or call MD for any changes in condition.  Patient escorted via [place], and D/C home via private auto.  Erasmo Leventhal, RN 01/10/2020 11:40 AM

## 2020-01-10 NOTE — Assessment & Plan Note (Deleted)
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

## 2020-01-10 NOTE — Discharge Summary (Addendum)
Patient ID: Harry Lucero MRN: [id number] DOB/AGE: 1955-09-11 64 y.o.  Admit date: 01/09/2020 Discharge date: 01/10/2020  Supervising Physician: Dr Ruthann Cancer  Patient Status: [facility] - In-pt  Admission Diagnoses: Portal vein/ SMV thrombosis  Discharge Diagnoses:  Active Problems:   Portal vein thrombosis   Discharged Condition: stable  Hospital Course: TIPs and portal vein thrombosis Was seen in consultation with Dr Serafina Royals for management of same 01/03/20. Scheduled then for Transjugular intrahepatic portal system shunt revision with portal vein recanalization procedure in IR 01/09/20. Procedure was attempted-- but unfortunately unable to access system and procedure aborted. He was admitted overnight into observation after anesthesia case for safety. Tolerated procedure well. Overnight stay was uneventful; slept well; eating well Denies N/V Denies pain Denies fever/chills UOP is great with clear yellow color Passing gas Hg has remained stable  I have seen and examined pt Dr Serafina Royals has been updated on pt status Approves discharge to home  Consults: None  Significant Diagnostic Studies:  Procedure: 01/09/20 1) Ultrasound guided right IJ access 2) Central manometry 3) Ultrasound guided percutaneous splenic vein access  Treatments: Unsuccessful attempt at retrograde cannulation of TIPS stent.  Unsuccessful attempt at trans-splenic antegrade splenic vein access.  Procedure terminated after multiple attempts by myself and Dr. Earleen Newport, fluoro time, etc.  Splenic access gelfoam track embolization.  Discharge Exam: Blood pressure 129/90, pulse 76, temperature 98.4 F (36.9 C), temperature source Oral, resp. rate 18, height 5\' 8"  (1.727 m), weight 195 lb (88.5 kg), SpO2 95 %.   A/O Pleasant In NAD Up in room already dressed to go home  Heart RRR Lungs CTA Abd soft; NT Neck Rt IJ site is clean and dry; no bleeding; no hematoma Left flank site: clean and dry; NT; no  bleeding; no hematoma Extr: FROM Ambulating without assistance  UOP great; clear yellow  Results for orders placed or performed during the hospital encounter of 01/09/20  CBC upon arrival  Result Value Ref Range   WBC 4.1 4.0 - 10.5 K/uL   RBC 4.33 4.22 - 5.81 MIL/uL   Hemoglobin 11.7 (L) 13.0 - 17.0 g/dL   HCT 36.3 (L) 39 - 52 %   MCV 83.8 80.0 - 100.0 fL   MCH 27.0 26.0 - 34.0 pg   MCHC 32.2 30.0 - 36.0 g/dL   RDW 16.2 (H) 11.5 - 15.5 %   Platelets 285 150 - 400 K/uL   nRBC 0.0 0.0 - 0.2 %  Protime-INR upon arrival  Result Value Ref Range   Prothrombin Time 12.5 11.4 - 15.2 seconds   INR 1.0 0.8 - 1.2  Ammonia  Result Value Ref Range   Ammonia 49 (H) 9 - 35 umol/L  Comprehensive metabolic panel  Result Value Ref Range   Sodium 133 (L) 135 - 145 mmol/L   Potassium 4.6 3.5 - 5.1 mmol/L   Chloride 104 98 - 111 mmol/L   CO2 20 (L) 22 - 32 mmol/L   Glucose, Bld 101 (H) 70 - 99 mg/dL   BUN 15 8 - 23 mg/dL   Creatinine, Ser 1.05 0.61 - 1.24 mg/dL   Calcium 9.2 8.9 - 10.3 mg/dL   Total Protein 6.6 6.5 - 8.1 g/dL   Albumin 3.4 (L) 3.5 - 5.0 g/dL   AST 62 (H) 15 - 41 U/L   ALT 146 (H) 0 - 44 U/L   Alkaline Phosphatase 85 38 - 126 U/L   Total Bilirubin 1.0 0.3 - 1.2 mg/dL   GFR, Estimated >60 >60 mL/min  Anion gap 9 5 - 15  CBC  Result Value Ref Range   WBC 8.3 4.0 - 10.5 K/uL   RBC 4.69 4.22 - 5.81 MIL/uL   Hemoglobin 12.5 (L) 13.0 - 17.0 g/dL   HCT 38.9 (L) 39 - 52 %   MCV 82.9 80.0 - 100.0 fL   MCH 26.7 26.0 - 34.0 pg   MCHC 32.1 30.0 - 36.0 g/dL   RDW 16.3 (H) 11.5 - 15.5 %   Platelets 282 150 - 400 K/uL   nRBC 0.0 0.0 - 0.2 %  Comprehensive metabolic panel  Result Value Ref Range   Sodium 137 135 - 145 mmol/L   Potassium 4.9 3.5 - 5.1 mmol/L   Chloride 104 98 - 111 mmol/L   CO2 26 22 - 32 mmol/L   Glucose, Bld 127 (H) 70 - 99 mg/dL   BUN 14 8 - 23 mg/dL   Creatinine, Ser 1.11 0.61 - 1.24 mg/dL   Calcium 9.9 8.9 - 10.3 mg/dL   Total Protein 7.0 6.5 - 8.1  g/dL   Albumin 3.4 (L) 3.5 - 5.0 g/dL   AST 39 15 - 41 U/L   ALT 119 (H) 0 - 44 U/L   Alkaline Phosphatase 77 38 - 126 U/L   Total Bilirubin 0.8 0.3 - 1.2 mg/dL   GFR, Estimated >60 >60 mL/min   Anion gap 7 5 - 15  CBC  Result Value Ref Range   WBC 7.4 4.0 - 10.5 K/uL   RBC 4.41 4.22 - 5.81 MIL/uL   Hemoglobin 11.9 (L) 13.0 - 17.0 g/dL   HCT 36.8 (L) 39 - 52 %   MCV 83.4 80.0 - 100.0 fL   MCH 27.0 26.0 - 34.0 pg   MCHC 32.3 30.0 - 36.0 g/dL   RDW 16.4 (H) 11.5 - 15.5 %   Platelets 282 150 - 400 K/uL   nRBC 0.0 0.0 - 0.2 %  Type and screen [place]  Result Value Ref Range   ABO/RH(D) B POS    Antibody Screen NEG    Sample Expiration 01/12/2020,2359    Unit Number [id number]    Blood Component Type RED CELLS,LR    Unit division 00    Status of Unit ALLOCATED    Transfusion Status OK TO TRANSFUSE    Crossmatch Result      Compatible Performed at [facility], [address] [place], [place] [address]    Unit Number [id number]    Blood Component Type RED CELLS,LR    Unit division 00    Status of Unit ALLOCATED    Transfusion Status OK TO TRANSFUSE    Crossmatch Result Compatible   ABO/Rh  Result Value Ref Range   ABO/RH(D)      B POS Performed at [facility] [address] [place], [place] [address]   Prepare RBC (crossmatch)  Result Value Ref Range   Order Confirmation      ORDER PROCESSED BY BLOOD BANK Performed at [facility], [address], [place], [place] [address]   BPAM [facility]  Result Value Ref Range   Blood Product Unit Number [id number]    PRODUCT CODE F8101B51    Unit Type and Rh 7300    Blood Product Expiration Date 025852778242    Blood Product Unit Number [id number]    PRODUCT CODE [id number]    Unit Type and Rh 7300    Blood Product Expiration Date 932671245809  Disposition:   Attempted Transjugular intrahepatic portal system shunt revision with portal vein recanalization in IR with Dr  Serafina Royals 01/09/20 Although unsuccessful secondary inability to access portal system-- pt was kept overnight observation for safety reasons. Now ready for Discharge He has appointment with Dr Lindi Adie 11/19 Friday 930 am. Pt is to restart Lovenox today : 100 mg BID (pt has at home) To start today Coumadin 5 mg po daily (Rx #5 given to pt) He also has appointment with Dr Aviva Signs at [facility] for endoscopy Jan 6,2022 To follow with PCP Dr Simona Huh    Discharge Instructions    Call MD for:  difficulty breathing, headache or visual disturbances   Complete by: As directed    Call MD for:  extreme fatigue   Complete by: As directed    Call MD for:  hives   Complete by: As directed    Call MD for:  persistant dizziness or light-headedness   Complete by: As directed    Call MD for:  persistant nausea and vomiting   Complete by: As directed    Call MD for:  redness, tenderness, or signs of infection (pain, swelling, redness, odor or green/yellow discharge around incision site)   Complete by: As directed    Call MD for:  severe uncontrolled pain   Complete by: As directed    Call MD for:  temperature >100.4   Complete by: As directed    Diet - low sodium heart healthy   Complete by: As directed    Discharge instructions   Complete by: As directed    Restful today-- resume reg diet; pt will hear from IR OP scheduler for follow up with Dr Serafina Royals; to see Dr Lindi Adie 11/18- Friday; continue Lovenox 100 mg BID and start Coumadin 5 mg po daily; call [phone] if questions   Discharge wound care:   Complete by: As directed    May shower today-- can change hospital bandages to bad aids after shower; keep clean band aids on areas x 7 days   Driving Restrictions   Complete by: As directed    No driving x 3 days   Increase activity slowly   Complete by: As directed    Lifting restrictions   Complete by: As directed    No lifting over 10 lbs 3 days     Allergies as of 01/10/2020   No Known  Allergies     Medication List    TAKE these medications   enoxaparin 100 MG/ML injection Commonly known as: LOVENOX Inject 0.9 mLs (90 mg total) into the skin 2 (two) times daily.   Vitamin D (Ergocalciferol) 1.25 MG (50000 UNIT) Caps capsule Commonly known as: DRISDOL Take 50,000 Units by mouth every Wednesday.            Discharge Care Instructions  (From admission, onward)         Start     Ordered   01/10/20 0000  Discharge wound care:       Comments: May shower today-- can change hospital bandages to bad aids after shower; keep clean band aids on areas x 7 days   01/10/20 1057          Follow-up Information    Suttle, Rosanne Ashing, MD Follow up in 1 month(s).   Specialties: Interventional Radiology, Diagnostic Radiology, Radiology Why: pt will hear from IR scheduler for follow up appt with Dr Serafina Royals; call [phone] if any questions Contact information: [address]  [place] [address] [phone]                Electronically Signed: Lavonia Drafts, PA-C 01/10/2020, 10:59 AM   I have spent Greater Than 30 Minutes discharging Harry Lucero.

## 2020-01-12 LAB — TYPE AND SCREEN
ABO/RH(D): B POS
Antibody Screen: NEGATIVE
Unit division: 0
Unit division: 0

## 2020-01-12 LAB — BPAM RBC
Blood Product Expiration Date: 202112082359
Blood Product Expiration Date: 202112082359
Unit Type and Rh: 7300
Unit Type and Rh: 7300

## 2020-01-12 NOTE — Progress Notes (Signed)
Patient Care Team: Pcp, No as PCP - General  DIAGNOSIS:    ICD-10-CM   1. Antiphospholipid antibody syndrome (HCC)  D68.61 Protime-INR    CHIEF COMPLIANT: Follow-up of acute portal venous thrombosis  INTERVAL HISTORY: Harry Lucero is a 64 y.o. with above-mentioned history of acute portal venous thrombosis. He was admitted from 12/11/19-12/12/19 for epigastric pain and he underwent a shunt revision on 01/10/20, but it could not be completed. He was discharged on Coumadin and Lovenox. He presents to the clinic today for follow-up.   ALLERGIES:  has No Known Allergies.  MEDICATIONS:  Current Outpatient Medications  Medication Sig Dispense Refill  . enoxaparin (LOVENOX) 100 MG/ML injection Inject 0.9 mLs (90 mg total) into the skin 2 (two) times daily. 54 mL 0  . Vitamin D, Ergocalciferol, (DRISDOL) 1.25 MG (50000 UNIT) CAPS capsule Take 50,000 Units by mouth every Wednesday.     . warfarin (COUMADIN) 5 MG tablet Take 2 tablets (10 mg total) by mouth daily. 60 tablet 3   No current facility-administered medications for this visit.    PHYSICAL EXAMINATION: ECOG PERFORMANCE STATUS: 1 - Symptomatic but completely ambulatory  Vitals:   01/13/20 0952  BP: 137/90  Pulse: 77  Resp: 17  Temp: 97.8 F (36.6 C)  SpO2: 100%   Filed Weights   01/13/20 0952  Weight: 198 lb 12.8 oz (90.2 kg)    LABORATORY DATA:  I have reviewed the data as listed CMP Latest Ref Rng & Units 01/10/2020 01/09/2020 01/03/2020  Glucose 70 - 99 mg/dL 127(H) 101(H) -  BUN 8 - 23 mg/dL 14 15 -  Creatinine 0.61 - 1.24 mg/dL 1.11 1.05 1.10  Sodium 135 - 145 mmol/L 137 133(L) -  Potassium 3.5 - 5.1 mmol/L 4.9 4.6 -  Chloride 98 - 111 mmol/L 104 104 -  CO2 22 - 32 mmol/L 26 20(L) -  Calcium 8.9 - 10.3 mg/dL 9.9 9.2 -  Total Protein 6.5 - 8.1 g/dL 7.0 6.6 -  Total Bilirubin 0.3 - 1.2 mg/dL 0.8 1.0 -  Alkaline Phos 38 - 126 U/L 77 85 -  AST 15 - 41 U/L 39 62(H) -  ALT 0 - 44 U/L 119(H) 146(H) -    Lab  Results  Component Value Date   WBC 7.4 01/10/2020   HGB 11.9 (L) 01/10/2020   HCT 36.8 (L) 01/10/2020   MCV 83.4 01/10/2020   PLT 282 01/10/2020    ASSESSMENT & PLAN:  Antiphospholipid antibody syndrome (HCC) 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 at [facility] health when he presented with abdominal pain (positive for lupus anticoagulant) SMV thrombosis Splenic vein thrombosis  Current treatment: Switched to Coumadin 01/09/2020 (when he was diagnosed with positive lupus anticoagulant) Antiphospholipid antibody: Discussed the anticardiolipin antibody in great detail and explained the mechanism of thrombosis related to these antibodies.  Patient will need to remain on blood thinners for life.  Hospitalization: 01/09/2020-01/10/2020 attempted TIPS recannulization procedure in IR, unsuccessful Continue with Coumadin therapy with close monitoring. Today's INR is 1.1.  I sent another prescription for Lovenox injections for 5 more days.  I also sent a prescription for Coumadin that he will take 10 mg daily for a week.  We will recheck his INR next week and then make further adjustments as needed. Weekly PT/INR checks I will see him back in a month in person.   Orders Placed This Encounter  Procedures  . Protime-INR    Standing Status:   Standing    Number of Occurrences:  20    Standing Expiration Date:   01/12/2021   The patient has a good understanding of the overall plan. he agrees with it. he will call with any problems that may develop before the next visit here.  Total time spent: 30 mins including face to face time and time spent for planning, charting and coordination of care  [name], MD 01/13/2020  I, Cloyde Reams Dorshimer, am acting as scribe for Dr. [name].  I have reviewed the above documentation for accuracy and completeness, and I agree with the above.

## 2020-01-13 ENCOUNTER — Other Ambulatory Visit: Payer: Self-pay

## 2020-01-13 ENCOUNTER — Inpatient Hospital Stay: Payer: BC Managed Care – PPO | Attending: Hematology and Oncology | Admitting: Hematology and Oncology

## 2020-01-13 ENCOUNTER — Inpatient Hospital Stay: Payer: BC Managed Care – PPO

## 2020-01-13 DIAGNOSIS — D6861 Antiphospholipid syndrome: Secondary | ICD-10-CM | POA: Insufficient documentation

## 2020-01-13 DIAGNOSIS — I81 Portal vein thrombosis: Secondary | ICD-10-CM | POA: Insufficient documentation

## 2020-01-13 DIAGNOSIS — Z7901 Long term (current) use of anticoagulants: Secondary | ICD-10-CM | POA: Diagnosis not present

## 2020-01-13 LAB — PROTIME-INR
INR: 1.1 (ref 0.8–1.2)
Prothrombin Time: 13.5 seconds (ref 11.4–15.2)

## 2020-01-13 MED ORDER — WARFARIN SODIUM 5 MG PO TABS: 10.0000 mg | ORAL_TABLET | Freq: Every day | ORAL | 3 refills | Status: DC

## 2020-01-13 MED ORDER — ENOXAPARIN SODIUM 100 MG/ML ~~LOC~~ SOLN: 90.0000 mg | Freq: Two times a day (BID) | SUBCUTANEOUS | 0 refills | Status: DC

## 2020-01-13 NOTE — Assessment & Plan Note (Signed)
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 at [facility] when he presented with abdominal pain (positive for lupus anticoagulant) SMV thrombosis Splenic vein thrombosis  Current treatment: Switched to Coumadin 01/09/2020 (when he was diagnosed with positive lupus anticoagulant)  Hospitalization: 01/09/2020-01/10/2020 attempted TIPS recannulization procedure in IR, unsuccessful Continue with Coumadin therapy with close monitoring.  Weekly PT/INR checks

## 2020-01-19 ENCOUNTER — Other Ambulatory Visit: Payer: Self-pay | Admitting: Oncology

## 2020-01-19 MED ORDER — ENOXAPARIN SODIUM 100 MG/ML ~~LOC~~ SOLN: 90.0000 mg | Freq: Two times a day (BID) | SUBCUTANEOUS | 0 refills | Status: DC

## 2020-01-20 ENCOUNTER — Other Ambulatory Visit: Payer: Self-pay

## 2020-01-20 ENCOUNTER — Inpatient Hospital Stay: Payer: BC Managed Care – PPO

## 2020-01-20 DIAGNOSIS — D6861 Antiphospholipid syndrome: Secondary | ICD-10-CM

## 2020-01-20 LAB — PROTIME-INR
INR: 1.7 — ABNORMAL HIGH (ref 0.8–1.2)
Prothrombin Time: 19.5 seconds — ABNORMAL HIGH (ref 11.4–15.2)

## 2020-01-27 ENCOUNTER — Telehealth: Payer: Self-pay | Admitting: *Deleted

## 2020-01-27 ENCOUNTER — Inpatient Hospital Stay: Payer: BC Managed Care – PPO | Attending: Hematology and Oncology

## 2020-01-27 ENCOUNTER — Other Ambulatory Visit: Payer: Self-pay

## 2020-01-27 DIAGNOSIS — I82890 Acute embolism and thrombosis of other specified veins: Secondary | ICD-10-CM | POA: Insufficient documentation

## 2020-01-27 DIAGNOSIS — I81 Portal vein thrombosis: Secondary | ICD-10-CM | POA: Diagnosis not present

## 2020-01-27 DIAGNOSIS — Z7901 Long term (current) use of anticoagulants: Secondary | ICD-10-CM | POA: Diagnosis not present

## 2020-01-27 DIAGNOSIS — D6862 Lupus anticoagulant syndrome: Secondary | ICD-10-CM | POA: Insufficient documentation

## 2020-01-27 DIAGNOSIS — Z79899 Other long term (current) drug therapy: Secondary | ICD-10-CM | POA: Insufficient documentation

## 2020-01-27 DIAGNOSIS — D6861 Antiphospholipid syndrome: Secondary | ICD-10-CM

## 2020-01-27 LAB — PROTIME-INR
INR: 5.7 (ref 0.8–1.2)
Prothrombin Time: 49.6 seconds — ABNORMAL HIGH (ref 11.4–15.2)

## 2020-01-27 NOTE — Progress Notes (Unsigned)
Critical INR called to [facility] at 1121 by hflynt 01/27/20

## 2020-01-27 NOTE — Telephone Encounter (Signed)
Noted INR of 5.7 reported from lab.  Called pt to verify current coumadin dose with pt stating " I was informed last week to take 10 mg twice a day and I take the lovenox twice a day."  Last dose of above medications was this am.  This RN reviewed with Dr Lindi Adie.  Called pt with MD recommendation to hold the coumadin and lovenox for 3 full days - and to restart the coumadin only on 12/7 at 10 mg daily.  Next lab recheck is 12/10 as scheduled.  This RN had pt repeat above to verify understanding of MD orders with correct read back.  This RN discussed concerns relating to increased bleeding- with pt again verifying understanding by repeating back concerns.  No further needs at this time.

## 2020-01-27 NOTE — Progress Notes (Signed)
CRITICAL VALUE STICKER  CRITICAL VALUE:INR 5.7  RECEIVER (on-site recipient of call):SGiles  DATE & TIME NOTIFIED: 01/27/20  MESSENGER (representative from lab):tech  MD NOTIFIED: Lindi Adie via nurse  TIME OF NOTIFICATION:1205  RESPONSE: MD aware

## 2020-02-03 ENCOUNTER — Inpatient Hospital Stay: Payer: BC Managed Care – PPO

## 2020-02-03 ENCOUNTER — Other Ambulatory Visit: Payer: Self-pay

## 2020-02-03 DIAGNOSIS — D6861 Antiphospholipid syndrome: Secondary | ICD-10-CM

## 2020-02-03 DIAGNOSIS — I81 Portal vein thrombosis: Secondary | ICD-10-CM | POA: Diagnosis not present

## 2020-02-03 LAB — PROTIME-INR
INR: 1.7 — ABNORMAL HIGH (ref 0.8–1.2)
Prothrombin Time: 19.6 seconds — ABNORMAL HIGH (ref 11.4–15.2)

## 2020-02-09 NOTE — Progress Notes (Signed)
Patient Care Team: Pcp, No as PCP - General  DIAGNOSIS:    ICD-10-CM   1. Portal vein thrombosis  I81     CHIEF COMPLIANT: Follow-up of acuteportalvenous thrombosis  INTERVAL HISTORY: Harry Lucero is a 64 y.o. with above-mentioned history of acuteportalvenous thrombosis currently on Coumadin. He presents to the clinic today for follow-up.   He tells me that he ran out of Coumadin and therefore has not been taking it for the past 2 to 3 days.  He is constantly worried about worsening blood clot situation.  ALLERGIES:  has No Known Allergies.  MEDICATIONS:  Current Outpatient Medications  Medication Sig Dispense Refill  . enoxaparin (LOVENOX) 100 MG/ML injection Inject 0.9 mLs (90 mg total) into the skin 2 (two) times daily. 54 mL 0  . Vitamin D, Ergocalciferol, (DRISDOL) 1.25 MG (50000 UNIT) CAPS capsule Take 50,000 Units by mouth every Wednesday.     . warfarin (COUMADIN) 5 MG tablet Take 2 tablets (10 mg total) by mouth daily. 60 tablet 3   No current facility-administered medications for this visit.    PHYSICAL EXAMINATION: ECOG PERFORMANCE STATUS: 1 - Symptomatic but completely ambulatory  Vitals:   02/10/20 0941  BP: 131/87  Pulse: 74  Resp: 17  Temp: 98.4 F (36.9 C)  SpO2: 100%   Filed Weights   02/10/20 0941  Weight: 205 lb 4.8 oz (93.1 kg)    LABORATORY DATA:  I have reviewed the data as listed CMP Latest Ref Rng & Units 01/10/2020 01/09/2020 01/03/2020  Glucose 70 - 99 mg/dL 127(H) 101(H) -  BUN 8 - 23 mg/dL 14 15 -  Creatinine 0.61 - 1.24 mg/dL 1.11 1.05 1.10  Sodium 135 - 145 mmol/L 137 133(L) -  Potassium 3.5 - 5.1 mmol/L 4.9 4.6 -  Chloride 98 - 111 mmol/L 104 104 -  CO2 22 - 32 mmol/L 26 20(L) -  Calcium 8.9 - 10.3 mg/dL 9.9 9.2 -  Total Protein 6.5 - 8.1 g/dL 7.0 6.6 -  Total Bilirubin 0.3 - 1.2 mg/dL 0.8 1.0 -  Alkaline Phos 38 - 126 U/L 77 85 -  AST 15 - 41 U/L 39 62(H) -  ALT 0 - 44 U/L 119(H) 146(H) -    Lab Results  Component  Value Date   WBC 7.4 01/10/2020   HGB 11.9 (L) 01/10/2020   HCT 36.8 (L) 01/10/2020   MCV 83.4 01/10/2020   PLT 282 01/10/2020    ASSESSMENT & PLAN:  Portal vein thrombosis Portal vein thrombosisdiagnosed September 2021 at [facility] health when he presented with abdominal pain (positive for lupus anticoagulant) SMV thrombosis Splenic vein thrombosis  Current treatment: Switched to Coumadin 01/09/2020 (when he was diagnosed with positive lupus anticoagulant) Antiphospholipid antibody: Discussed the anticardiolipin antibody in great detail and explained the mechanism of thrombosis related to these antibodies.  Patient will need to remain on blood thinners for life.  Hospitalization: 01/09/2020-01/10/2020 attempted TIPS recannulization procedure in IR, unsuccessful Continue with Coumadin therapy with close monitoring. Lab review: 01/27/2020: INR 5.7 02/03/2020: INR 1.7 02/10/2020: INR 1.5  Coumadin dose: I instructed him to take Coumadin 10 mg Monday through Thursday and 5 mg Friday to Sunday.  INR checks weekly x4 Telephone visit in 1 month to discuss his plan. I provided him a letter that states that he can go back to work 03/05/2020 without restrictions.    No orders of the defined types were placed in this encounter.  The patient has a good understanding of the overall  plan. he agrees with it. he will call with any problems that may develop before the next visit here.  Total time spent: 20 mins including face to face time and time spent for planning, charting and coordination of care  [name], MD 02/10/2020  I, Cloyde Reams Dorshimer, am acting as scribe for Dr. [name].  I have reviewed the above documentation for accuracy and completeness, and I agree with the above.

## 2020-02-09 NOTE — Assessment & Plan Note (Signed)
Portal vein thrombosisdiagnosed September 2021 at [facility] when he presented with abdominal pain (positive for lupus anticoagulant) SMV thrombosis Splenic vein thrombosis  Current treatment: Switched to Coumadin 01/09/2020 (when he was diagnosed with positive lupus anticoagulant) Antiphospholipid antibody: Discussed the anticardiolipin antibody in great detail and explained the mechanism of thrombosis related to these antibodies.  Patient will need to remain on blood thinners for life.  Hospitalization: 01/09/2020-01/10/2020 attempted TIPS recannulization procedure in IR, unsuccessful Continue with Coumadin therapy with close monitoring.  Today's INR is 1.1.  I sent another prescription for Lovenox injections for 5 more days.  I also sent a prescription for Coumadin that he will take 10 mg daily for a week.  We will recheck his INR next week and then make further adjustments as needed.  Weekly PT/INR checks I will see him back in a month in person.

## 2020-02-10 ENCOUNTER — Other Ambulatory Visit: Payer: Self-pay

## 2020-02-10 ENCOUNTER — Inpatient Hospital Stay: Payer: BC Managed Care – PPO

## 2020-02-10 ENCOUNTER — Inpatient Hospital Stay (HOSPITAL_BASED_OUTPATIENT_CLINIC_OR_DEPARTMENT_OTHER): Payer: BC Managed Care – PPO | Admitting: Hematology and Oncology

## 2020-02-10 DIAGNOSIS — I81 Portal vein thrombosis: Secondary | ICD-10-CM

## 2020-02-10 DIAGNOSIS — D6861 Antiphospholipid syndrome: Secondary | ICD-10-CM

## 2020-02-10 LAB — PROTIME-INR
INR: 1.5 — ABNORMAL HIGH (ref 0.8–1.2)
Prothrombin Time: 17.8 seconds — ABNORMAL HIGH (ref 11.4–15.2)

## 2020-02-10 MED ORDER — WARFARIN SODIUM 5 MG PO TABS
10.0000 mg | ORAL_TABLET | Freq: Every day | ORAL | 3 refills | Status: DC
Start: 2020-02-10 — End: 2020-03-23

## 2020-02-16 ENCOUNTER — Inpatient Hospital Stay: Payer: BC Managed Care – PPO

## 2020-02-22 DIAGNOSIS — E119 Type 2 diabetes mellitus without complications: Secondary | ICD-10-CM | POA: Insufficient documentation

## 2020-02-22 DIAGNOSIS — Z8546 Personal history of malignant neoplasm of prostate: Secondary | ICD-10-CM | POA: Insufficient documentation

## 2020-02-22 DIAGNOSIS — K862 Cyst of pancreas: Secondary | ICD-10-CM | POA: Insufficient documentation

## 2020-02-23 ENCOUNTER — Other Ambulatory Visit: Payer: Self-pay

## 2020-02-23 ENCOUNTER — Inpatient Hospital Stay: Payer: BC Managed Care – PPO

## 2020-02-23 DIAGNOSIS — D6861 Antiphospholipid syndrome: Secondary | ICD-10-CM

## 2020-02-23 DIAGNOSIS — I81 Portal vein thrombosis: Secondary | ICD-10-CM | POA: Diagnosis not present

## 2020-02-23 LAB — PROTIME-INR
INR: 1.8 — ABNORMAL HIGH (ref 0.8–1.2)
Prothrombin Time: 20.6 seconds — ABNORMAL HIGH (ref 11.4–15.2)

## 2020-02-29 ENCOUNTER — Other Ambulatory Visit (HOSPITAL_COMMUNITY)
Admission: RE | Admit: 2020-02-29 | Discharge: 2020-02-29 | Disposition: A | Discharge status: Home / Self Care | Payer: BC Managed Care – PPO | Source: Ambulatory Visit | Attending: Radiation Oncology | Admitting: Radiation Oncology

## 2020-02-29 ENCOUNTER — Encounter: Payer: Self-pay | Admitting: Radiation Oncology

## 2020-02-29 DIAGNOSIS — Z01812 Encounter for preprocedural laboratory examination: Secondary | ICD-10-CM | POA: Insufficient documentation

## 2020-02-29 DIAGNOSIS — U071 COVID-19: Secondary | ICD-10-CM | POA: Insufficient documentation

## 2020-02-29 DIAGNOSIS — R1012 Left upper quadrant pain: Secondary | ICD-10-CM | POA: Insufficient documentation

## 2020-02-29 DIAGNOSIS — R0781 Pleurodynia: Secondary | ICD-10-CM | POA: Insufficient documentation

## 2020-02-29 NOTE — Progress Notes (Signed)
GU Location of Tumor / Histology: prostatic adenocarcinoma  If Prostate Cancer, Gleason Score is (3 + 4) and PSA is (10). Prostate volume: 10. Prostate volume 53 g.   [name] referred to Dr. Arita Miss for further evaluation of an elevated PSA of 11.19 noted in September 2021  Biopsies of prostate (if applicable) revealed:    Past/Anticipated interventions by urology, if any: prostate biopsy, no imaging, referral to discuss robotic prostatectomy vs radiation therapy  Past/Anticipated interventions by medical oncology, if any: no  Weight changes, if any:   Bowel/Bladder complaints, if any:    Nausea/Vomiting, if any:   Pain issues, if any:    SAFETY ISSUES: Prior radiation?  Pacemaker/ICD?  Possible current pregnancy? no, male patient Is the patient on methotrexate?   Current Complaints / other details:  65 year old male. Married. Lives in [place]. Portal vein thrombus managed by Dr. Pamelia Hoit and Eliquis

## 2020-03-01 ENCOUNTER — Telehealth: Payer: Self-pay | Admitting: Radiation Oncology

## 2020-03-01 ENCOUNTER — Other Ambulatory Visit: Payer: BC Managed Care – PPO

## 2020-03-01 LAB — SARS CORONAVIRUS 2 (TAT 6-24 HRS): SARS Coronavirus 2: POSITIVE — AB

## 2020-03-01 NOTE — Telephone Encounter (Signed)
Received voicemail message from [place] at the COVID test site that the patient has tested positive for COVID. The purpose of the message from Koleen Nimrod was to ensure our staff was aware so tomorrow's consult could be rescheduled. Phoned patient's home. No answer. Left voicemail message. Phoned patient's wife, Sedalia Muta. Inquired about rescheduling. She verbalized how shocked she is to learn her husband has COVID. [name] goes onto explain they are at [facility] currently because he had a procedure this morning. Encouraged the wife and any other residents of their home be tested for covid. She verbalized understanding. Also, encouraged her to reach our to their PCP for directions on COVID management. [name] explains that they would prefer an in person consult when I offered to change tomorrow's consult appointment to a telephone encounter. Explained our schedulers will be in touch with a new consult date and time for her husband. She verbalized understanding.

## 2020-03-01 NOTE — Progress Notes (Signed)
Called the office of Dr. Margaretmary Dys and left a voice message for his nurse with + covid results for this pt. The pt is to have a procedure on 03/02/20 at [facility] starting at 10:30am.   These are the current guidelines:  Positive Results for:  Asymptomatic: Procedure postponed and quarantined for 10 days. Symptomatic: Procedure postponed and quarantined for 14 days. Hospitalized with Covid: postponed and quarantined  for 21 days. Immunocompromised: Procedure postponed and quarantine for 20 days.  The pt will not be retested for 90 days from the + result.

## 2020-03-02 ENCOUNTER — Telehealth: Payer: Self-pay | Admitting: Hematology and Oncology

## 2020-03-02 ENCOUNTER — Ambulatory Visit: Payer: BC Managed Care – PPO

## 2020-03-02 ENCOUNTER — Ambulatory Visit
Admission: RE | Admit: 2020-03-02 | Discharge: 2020-03-02 | Disposition: A | Discharge status: Home / Self Care | Payer: BC Managed Care – PPO | Source: Ambulatory Visit | Attending: Radiation Oncology | Admitting: Radiation Oncology

## 2020-03-02 ENCOUNTER — Other Ambulatory Visit: Payer: BC Managed Care – PPO

## 2020-03-02 ENCOUNTER — Telehealth: Payer: Self-pay | Admitting: Adult Health

## 2020-03-02 HISTORY — DX: Malignant neoplasm of prostate: C61

## 2020-03-02 NOTE — Telephone Encounter (Signed)
Called to discuss with patient about COVID-19 symptoms and the use of one of the available treatments for those with mild to moderate Covid symptoms and at a high risk of hospitalization.  Pt appears to qualify for outpatient treatment due to co-morbid conditions and/or a member of an at-risk group in accordance with the FDA Emergency Use Authorization.    Symptom onset: asymptomatic  Patient does not qualify due to being asymptomatic. We reviewed this in detail.    Scot Dock

## 2020-03-02 NOTE — Telephone Encounter (Signed)
Called pt per 1/7 sch msg - no answer . Left message for pt to call back to reschedule.

## 2020-03-02 NOTE — Telephone Encounter (Signed)
Rescheduled per pt. Called and spoke with pt, confirmed new appts

## 2020-03-05 ENCOUNTER — Telehealth: Payer: Self-pay | Admitting: Radiation Oncology

## 2020-03-05 NOTE — Telephone Encounter (Signed)
I spoke with patient and now have him r/s for 03/27/2020 @ 1:30/2pm for Dr. Tammi Klippel.

## 2020-03-08 ENCOUNTER — Other Ambulatory Visit: Payer: BC Managed Care – PPO

## 2020-03-13 ENCOUNTER — Ambulatory Visit: Payer: BC Managed Care – PPO | Admitting: Hematology and Oncology

## 2020-03-19 ENCOUNTER — Other Ambulatory Visit: Payer: Self-pay | Admitting: Interventional Radiology

## 2020-03-19 DIAGNOSIS — I81 Portal vein thrombosis: Secondary | ICD-10-CM

## 2020-03-22 ENCOUNTER — Inpatient Hospital Stay: Payer: BC Managed Care – PPO | Attending: Hematology and Oncology

## 2020-03-22 ENCOUNTER — Other Ambulatory Visit (HOSPITAL_COMMUNITY): Payer: BC Managed Care – PPO

## 2020-03-22 ENCOUNTER — Other Ambulatory Visit: Payer: Self-pay

## 2020-03-22 DIAGNOSIS — Z79899 Other long term (current) drug therapy: Secondary | ICD-10-CM | POA: Diagnosis not present

## 2020-03-22 DIAGNOSIS — D6861 Antiphospholipid syndrome: Secondary | ICD-10-CM | POA: Diagnosis present

## 2020-03-22 DIAGNOSIS — Z7901 Long term (current) use of anticoagulants: Secondary | ICD-10-CM | POA: Diagnosis not present

## 2020-03-22 DIAGNOSIS — I82890 Acute embolism and thrombosis of other specified veins: Secondary | ICD-10-CM | POA: Diagnosis not present

## 2020-03-22 LAB — PROTIME-INR
INR: 1.6 — ABNORMAL HIGH (ref 0.8–1.2)
Prothrombin Time: 18.2 seconds — ABNORMAL HIGH (ref 11.4–15.2)

## 2020-03-22 NOTE — Progress Notes (Signed)
Patient Care Team: Pcp, No as PCP - General  DIAGNOSIS:    ICD-10-CM   1. Antiphospholipid antibody syndrome (HCC)  D68.61     CHIEF COMPLIANT: Follow-up ofacuteportalvenous thrombosis  INTERVAL HISTORY: Harry Lucero is a 65 y.o. with above-mentioned history of acuteportalvenous thrombosis currently on Coumadin. He tested positive for COVID-19 on 02/29/20. He presents to the clinic todayfor follow-up. He had upper endoscopy and endoscopic ultrasound at [facility].  I do not see any results on the computer system. He is currently on Coumadin 10 mg Monday through Thursday and 5 mg on Friday Saturday Sunday.  His INR today is 1.6.  He tells me that he is drinking a juice that is made appropriate green leafy vegetables every day.  ALLERGIES:  has No Known Allergies.  MEDICATIONS:  Current Outpatient Medications  Medication Sig Dispense Refill   glipiZIDE (GLUCOTROL XL) 2.5 MG 24 hr tablet Take 2.5 mg by mouth daily.     lisinopril (ZESTRIL) 2.5 MG tablet Take 2.5 mg by mouth daily.     pravastatin (PRAVACHOL) 20 MG tablet Take 20 mg by mouth daily.     warfarin (COUMADIN) 10 MG tablet Take 1 tablet (10 mg total) by mouth daily. 90 tablet 3   No current facility-administered medications for this visit.    PHYSICAL EXAMINATION: ECOG PERFORMANCE STATUS: 1 - Symptomatic but completely ambulatory  Vitals:   03/23/20 1041  BP: 136/88  Pulse: 63  Resp: 17  Temp: 97.7 F (36.5 C)  SpO2: 100%   Filed Weights   03/23/20 1041  Weight: 206 lb 4.8 oz (93.6 kg)    LABORATORY DATA:  I have reviewed the data as listed CMP Latest Ref Rng & Units 01/10/2020 01/09/2020 01/03/2020  Glucose 70 - 99 mg/dL 127(H) 101(H) -  BUN 8 - 23 mg/dL 14 15 -  Creatinine 0.61 - 1.24 mg/dL 1.11 1.05 1.10  Sodium 135 - 145 mmol/L 137 133(L) -  Potassium 3.5 - 5.1 mmol/L 4.9 4.6 -  Chloride 98 - 111 mmol/L 104 104 -  CO2 22 - 32 mmol/L 26 20(L) -  Calcium 8.9 - 10.3 mg/dL 9.9 9.2 -   Total Protein 6.5 - 8.1 g/dL 7.0 6.6 -  Total Bilirubin 0.3 - 1.2 mg/dL 0.8 1.0 -  Alkaline Phos 38 - 126 U/L 77 85 -  AST 15 - 41 U/L 39 62(H) -  ALT 0 - 44 U/L 119(H) 146(H) -    Lab Results  Component Value Date   WBC 7.4 01/10/2020   HGB 11.9 (L) 01/10/2020   HCT 36.8 (L) 01/10/2020   MCV 83.4 01/10/2020   PLT 282 01/10/2020    ASSESSMENT & PLAN:  Antiphospholipid antibody syndrome ([place]) Portal vein 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 Lab review: 01/27/2020: INR 5.7 02/03/2020: INR 1.7 02/23/2020: INR 1.8 03/22/2020: INR 1.6 (his INR is subtherapeutic because he is drinking green juice with lots of green leafy vegetables daily)  I recommend increasing the dosage of Coumadin to 10 mg daily. He has an appointment to undergo CT of his abdomen pelvis to assess the obstruction. He has appointment to see interventional urology afterwards.  Return to clinic every 2 weeks for labs Follow-up with me in 2 months with  a virtual visit    No orders of the defined types were placed in this encounter.  The patient has a good understanding of the overall plan. he agrees with it. he will call with any problems that may develop before the next visit here.  Total time spent: 20 mins including face to face time and time spent for planning, charting and coordination of care  [name], MD 03/23/2020  I, Cloyde Reams Dorshimer, am acting as scribe for Dr. [name].  I have reviewed the above documentation for accuracy and completeness, and I agree with the above.

## 2020-03-23 ENCOUNTER — Other Ambulatory Visit: Payer: BC Managed Care – PPO

## 2020-03-23 ENCOUNTER — Other Ambulatory Visit: Payer: Self-pay

## 2020-03-23 ENCOUNTER — Inpatient Hospital Stay (HOSPITAL_BASED_OUTPATIENT_CLINIC_OR_DEPARTMENT_OTHER): Payer: BC Managed Care – PPO | Admitting: Hematology and Oncology

## 2020-03-23 DIAGNOSIS — D6861 Antiphospholipid syndrome: Secondary | ICD-10-CM | POA: Diagnosis not present

## 2020-03-23 MED ORDER — WARFARIN SODIUM 10 MG PO TABS
10.0000 mg | ORAL_TABLET | Freq: Every day | ORAL | 3 refills | Status: DC
Start: 2020-03-23 — End: 2020-12-25

## 2020-03-23 NOTE — Assessment & Plan Note (Signed)
Portal vein thrombosisdiagnosed September 2021 at [facility] when he presented with abdominal pain(positive for lupus anticoagulant) SMV thrombosis Splenic vein thrombosis  Current treatment:Switched to Coumadin 01/09/2020 (when he was diagnosed with positive lupus anticoagulant)  Hospitalization: 01/09/2020-01/10/2020 attemptedTIPSrecannulization procedure in IR, unsuccessful Continue with Coumadin therapy with close monitoring. Lab review: 01/27/2020: INR 5.7 02/03/2020: INR 1.7 02/23/2020: INR 1.8 03/22/2020: INR 1.6  I recommend increasing the dosage of Coumadin to 10 mg daily. Return to clinic every 2 weeks for labs Follow-up with me in 3 months with a virtual visit

## 2020-03-27 ENCOUNTER — Ambulatory Visit
Admission: RE | Admit: 2020-03-27 | Discharge: 2020-03-27 | Disposition: A | Discharge status: Home / Self Care | Payer: BC Managed Care – PPO | Source: Ambulatory Visit | Attending: Radiation Oncology | Admitting: Radiation Oncology

## 2020-03-27 ENCOUNTER — Encounter: Payer: Self-pay | Admitting: Medical Oncology

## 2020-03-27 ENCOUNTER — Other Ambulatory Visit: Payer: Self-pay

## 2020-03-27 ENCOUNTER — Telehealth: Payer: BC Managed Care – PPO

## 2020-03-27 ENCOUNTER — Encounter: Payer: Self-pay | Admitting: Radiation Oncology

## 2020-03-27 VITALS — BP 125/87 | HR 72 | Temp 97.9°F | Resp 18 | Ht 68.0 in | Wt 207.4 lb

## 2020-03-27 DIAGNOSIS — C61 Malignant neoplasm of prostate: Secondary | ICD-10-CM | POA: Insufficient documentation

## 2020-03-27 NOTE — Progress Notes (Signed)
GU Location of Tumor / Histology: prostatic adenocarcinoma  If Prostate Cancer, Gleason Score is (3 + 4) and PSA is (10). Prostate volume: 10. Prostate volume 53 g.   Harry Lucero referred to Dr. Claudia Desanctis for further evaluation of an elevated PSA of 11.19 noted in September 2021  Biopsies of prostate (if applicable) revealed:    Past/Anticipated interventions by urology, if any: prostate biopsy, no imaging, referral to discuss robotic prostatectomy vs radiation therapy  Past/Anticipated interventions by medical oncology, if any: no  Weight changes, if any: denies  Bowel/Bladder complaints, if any:  IPSS 8. SHIM 17. Denies dysuria, hematuria, urinary leakage or incontinence. Denies any bowel complaints.  Nausea/Vomiting, if any: denies  Pain issues, if any:  Denies any new pain  SAFETY ISSUES:  Prior radiation? denies  Pacemaker/ICD? denies  Possible current pregnancy? no, male patient  Is the patient on methotrexate? denies  Current Complaints / other details:  65 year old male. Married. Lives in [place]. Portal vein thrombus managed by Dr. Lindi Adie and Coumadin

## 2020-03-27 NOTE — Progress Notes (Signed)
Introduced myself to patient and his wife as the prostate nurse navigator and discussed my role. He is here to discuss his radiation treatment options.  I gave him my business card and asked them to call me with questions or concerns. They voiced understanding.

## 2020-03-27 NOTE — Progress Notes (Signed)
Radiation Oncology         [id number]) [phone] ________________________________  Initial outpatient Consultation  Name: Harry Lucero MRN: [id number]  Date: 03/27/2020  DOB: 03/13/1955  CC:Pcp, No  Robley Fries, MD   REFERRING PHYSICIAN: Robley Fries, MD  DIAGNOSIS: 65 y.o. gentleman with Stage T1c adenocarcinoma of the prostate with Gleason score of 3+4, and PSA of 10.    ICD-10-CM   1. Malignant neoplasm of prostate ([place])  C61     HISTORY OF PRESENT ILLNESS: Harry Lucero is a 65 y.o. male with a diagnosis of prostate cancer. He was noted to have an elevated PSA of 11.19 by his primary care provider, Simona Huh, NP.  He had recently had a Foley catheter in place August 31 through November 04, 2019 during the hospitalization for a TIPS procedure for portal vein thrombosis in [place].  Accordingly, he was referred for evaluation in urology by Dr. Claudia Desanctis on 12/08/2019,  digital rectal examination was performed at that time revealing no nodules or concerning findings.  A repeat PSA performed that day was slightly decreased but remained elevated at 10. The patient proceeded to transrectal ultrasound with 12 biopsies of the prostate on 01/30/2020.  The prostate volume measured 53 cc.  Out of 12 core biopsies, 3 were positive.  The maximum Gleason score was 3+4, and this was seen in the left mid and right mid lateral. Additionally, Gleason 3+3 was seen in the right apex lateral.  Of note, he is followed by Dr. Lindi Adie for continued management of the acute portal vein re-thrombosis, most recently seen 03/23/2020, and is on chronic anticoagulation with Coumadin.  An attempt was made recently on 01/09/2020 to recanalize the portal vein and revise the occluded TIPS shunt but unfortunately was unsuccessful as they were unable to access the system and the procedure was aborted.  The patient reviewed the biopsy results with his urologist and he has kindly been referred today for discussion of potential  radiation treatment options.  He has also met with Dr. Abner Greenspan on 03/13/2018 to discuss RALP and has a follow-up visit scheduled on 04/17/2020.   PREVIOUS RADIATION THERAPY: No  PAST MEDICAL HISTORY:  Past Medical History:  Diagnosis Date   Portal vein thrombosis    Prostate cancer ([place])       PAST SURGICAL HISTORY: Past Surgical History:  Procedure Laterality Date   IR RADIOLOGIST EVAL & MGMT  01/03/2020   IR TIPS REVISION MOD SED  01/09/2020   PROSTATE BIOPSY     RADIOLOGY WITH ANESTHESIA N/A 01/09/2020   Procedure: IR WITH ANESTHESIA TIPS REVISION;  Surgeon: Radiologist, Medication, MD;  Location: [facility];  Service: Radiology;  Laterality: N/A;   TIPS PROCEDURE      FAMILY HISTORY:  Family History  Problem Relation Age of Onset   Prostate cancer Neg Hx    Colon cancer Neg Hx    Pancreatic cancer Neg Hx    Breast cancer Neg Hx     SOCIAL HISTORY:  Social History   Socioeconomic History   Marital status: Married    Spouse name: [name]   Number of children: 3   Years of education: Not on file   Highest education level: Not on file  Occupational History   Not on file  Tobacco Use   Smoking status: Former Smoker    Packs/day: 1.00    Years: 20.00    Pack years: 20.00    Types: Cigarettes    Quit date: 02/24/1994  Years since quitting: 26.1   Smokeless tobacco: Never Used  Vaping Use   Vaping Use: Never used  Substance and Sexual Activity   Alcohol use: No   Drug use: No   Sexual activity: Yes  Other Topics Concern   Not on file  Social History Narrative   Not on file   Social Determinants of Health   Financial Resource Strain: Not on file  Food Insecurity: Not on file  Transportation Needs: Not on file  Physical Activity: Not on file  Stress: Not on file  Social Connections: Not on file  Intimate Partner Violence: Not on file    ALLERGIES: Patient has no known allergies.  MEDICATIONS:  Current Outpatient Medications  Medication  Sig Dispense Refill   Blood Glucose Monitoring Suppl (CONTOUR NEXT EZ) w/Device KIT See admin instructions.     CONTOUR NEXT TEST test strip daily.     glipiZIDE (GLUCOTROL XL) 2.5 MG 24 hr tablet Take 2.5 mg by mouth daily.     lisinopril (ZESTRIL) 2.5 MG tablet Take 2.5 mg by mouth daily.     Microlet Lancets MISC daily.     pravastatin (PRAVACHOL) 20 MG tablet Take 20 mg by mouth daily.     warfarin (COUMADIN) 10 MG tablet Take 1 tablet (10 mg total) by mouth daily. 90 tablet 3   No current facility-administered medications for this encounter.    REVIEW OF SYSTEMS:  On review of systems, the patient reports that he is doing well overall. He denies any chest pain, shortness of breath, cough, fevers, chills, night sweats, unintended weight changes. He denies any bowel disturbances, and denies abdominal pain, nausea or vomiting. He denies any new musculoskeletal or joint aches or pains. His IPSS was 8, indicating mild urinary symptoms. His SHIM was 17, indicating he has moderate erectile dysfunction. A complete review of systems is obtained and is otherwise negative.    PHYSICAL EXAM:  Wt Readings from Last 3 Encounters:  03/27/20 207 lb 6.4 oz (94.1 kg)  03/23/20 206 lb 4.8 oz (93.6 kg)  02/10/20 205 lb 4.8 oz (93.1 kg)   Temp Readings from Last 3 Encounters:  03/27/20 97.9 F (36.6 C)  03/23/20 97.7 F (36.5 C) (Temporal)  02/10/20 98.4 F (36.9 C) (Tympanic)   BP Readings from Last 3 Encounters:  03/27/20 125/87  03/23/20 136/88  02/10/20 131/87   Pulse Readings from Last 3 Encounters:  03/27/20 72  03/23/20 63  02/10/20 74   Pain Assessment Pain Score: 0-No pain/10  In general this is a well appearing African-American man in no acute distress. He's alert and oriented x4 and appropriate throughout the examination. Cardiopulmonary assessment is negative for acute distress, and he exhibits normal effort.     KPS = 100  100 - Normal; no complaints; no evidence of  disease. 90   - Able to carry on normal activity; minor signs or symptoms of disease. 80   - Normal activity with effort; some signs or symptoms of disease. 27   - Cares for self; unable to carry on normal activity or to do active work. 60   - Requires occasional assistance, but is able to care for most of his personal needs. 50   - Requires considerable assistance and frequent medical care. 66   - Disabled; requires special care and assistance. 22   - Severely disabled; hospital admission is indicated although death not imminent. 52   - Very sick; hospital admission necessary; active supportive treatment necessary. 10   -  Moribund; fatal processes progressing rapidly. 0     - Dead  Karnofsky DA, Abelmann Lutsen, Craver LS and Burchenal [facility] [phone]) The use of the nitrogen mustards in the palliative treatment of carcinoma: with particular reference to bronchogenic carcinoma Cancer 1 634-56  LABORATORY DATA:  Lab Results  Component Value Date   WBC 7.4 01/10/2020   HGB 11.9 (L) 01/10/2020   HCT 36.8 (L) 01/10/2020   MCV 83.4 01/10/2020   PLT 282 01/10/2020   Lab Results  Component Value Date   NA 137 01/10/2020   K 4.9 01/10/2020   CL 104 01/10/2020   CO2 26 01/10/2020   Lab Results  Component Value Date   ALT 119 (H) 01/10/2020   AST 39 01/10/2020   ALKPHOS 77 01/10/2020   BILITOT 0.8 01/10/2020     RADIOGRAPHY: No results found.    IMPRESSION/PLAN: 1. 65 y.o. gentleman with Stage T1c adenocarcinoma of the prostate with Gleason Score of 3+4, and PSA of 10. We discussed the patient's workup and outlined the nature of prostate cancer in this setting. The patient's Gleason's score, and PSA put him into the favorable intermediate risk group. Accordingly, he is eligible for a variety of potential treatment options including brachytherapy, 5.5 weeks of external radiation, or prostatectomy. We discussed the available radiation techniques, and focused on the details and logistics of  delivery. We discussed and outlined the risks, benefits, short and long-term effects associated with radiotherapy and compared and contrasted these with prostatectomy. We discussed the role of SpaceOAR gel in reducing the rectal toxicity associated with radiotherapy.  He appears to have a good understanding of his disease and our treatment recommendations which are of curative intent.  He was encouraged to ask questions that were answered to his stated satisfaction.  At the conclusion of our conversation, the patient is interested in moving forward with brachytherapy and use of SpaceOAR gel to reduce rectal toxicity from radiotherapy.  We will share our discussion with Dr. Claudia Desanctis and move forward with scheduling his CT [facility] planning appointment in the near future.  The patient met briefly with Romie Jumper in our office who will be working closely with him to coordinate OR scheduling and pre and post procedure appointments.  We will contact the pharmaceutical rep to ensure that Carroll is available at the time of procedure.  We enjoyed meeting him today and look forward to continuing to participate in his care.Nicholos Johns, PA-C    Tyler Pita, MD  [facility] Direct Dial: [phone]   Fax: [phone] [email or web address]   Skype   LinkedIn   This document serves as a record of services personally performed by Tyler Pita, MD and Freeman Caldron, PA-C. It was created on their behalf by Wilburn Mylar, a trained medical scribe. The creation of this record is based on the scribe's personal observations and the provider's statements to them. This document has been checked and approved by the attending provider.

## 2020-03-28 ENCOUNTER — Telehealth: Payer: Self-pay | Admitting: Radiation Oncology

## 2020-03-28 ENCOUNTER — Encounter: Payer: Self-pay | Admitting: Licensed Clinical Social Worker

## 2020-03-28 NOTE — Progress Notes (Signed)
Patient requesting radiation oncology notes be faxed to Dr. Rosanne Ashing. [place] at [facility]. Patient signed release of information. [place] staff notified via [employer].

## 2020-03-28 NOTE — Progress Notes (Signed)
[place] Work  Initial Assessment   Harry Lucero is a 65 y.o. year old male contacted by phone. Clinical Social Work was referred by distress screen for assessment of psychosocial needs.   SDOH (Social Determinants of Health) assessments performed: Yes SDOH Interventions   Flowsheet Row Most Recent Value  SDOH Interventions   Food Insecurity Interventions [id number] Referral, Other (Comment)  [food pantry list]  Financial Strain Interventions [id number] Referral      Distress Screen completed: Yes ONCBCN DISTRESS SCREENING 03/28/2020  Screening Type Initial Screening  Distress experienced in past week (1-10) 5  Practical problem type Housing;Insurance;Work/school;Food  Emotional problem type Nervousness/Anxiety;Adjusting to illness;Boredom  Physical Problem type Getting around;Breathing;Loss of appetitie;Skin dry/itchy  Physician notified of physical symptoms Yes  Referral to clinical psychology No  Referral to clinical social work No  Referral to dietition No  Referral to financial advocate No  Referral to support programs Yes  Referral to palliative care No      Family/Social Information:  . Housing Arrangement: patient lives with wife, Harry Lucero . Family members/support persons in your life? Family . Employment: Out of work due to medical issues that began when in [place] and are continuing. Income source: Short-Term Disability. Wife is on fixed income . 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 Food access concerns: yes, difficult to afford . Services Currently in place:  Short term disability. Considering drawing on early social security retirement  Coping/ Adjustment to diagnosis: . Patient understands treatment plan and what happens next? yes . Concerns about diagnosis and/or treatment: Adjusting to cancer diagnosis. Other stressors with affording basic  needs . Patient reported stressors: Finances, Adjusting to my illness and Isolation/ feeling alone . Current coping skills/ strengths: Capable of independent living and Motivation for treatment/growth    SUMMARY: Current SDOH Barriers:  . Financial constraints related to medical concerns keeping him out of work  Clinical Social Work Clinical Goal(s):  Marland Kitchen Patient will respond to and connect with local resources for rent/utility and food assistance  Interventions: . Informed patient of the support team roles and support services at [facility]. Signed up for Prostate Cancer Support group . Provided CSW contact information and encouraged patient to call with any questions or concerns . Referred patient to [facility] center for housing and community studies, [employer], [employer] Building surveyor. Provided list of additional resources and food pantries.   Follow Up Plan: Patient will work with agencies provided by CSW to address financial needs. Patient will contact CSW with any questions Patient verbalizes understanding of plan: Yes    Christeen Douglas , LCSW

## 2020-03-28 NOTE — Telephone Encounter (Signed)
Opened in error

## 2020-03-29 ENCOUNTER — Other Ambulatory Visit: Payer: BC Managed Care – PPO

## 2020-04-01 ENCOUNTER — Other Ambulatory Visit: Payer: Self-pay | Admitting: Hematology and Oncology

## 2020-04-04 ENCOUNTER — Ambulatory Visit: Payer: BC Managed Care – PPO | Admitting: Hematology and Oncology

## 2020-04-06 ENCOUNTER — Inpatient Hospital Stay: Payer: BC Managed Care – PPO | Attending: Hematology and Oncology

## 2020-04-06 ENCOUNTER — Other Ambulatory Visit: Payer: Self-pay

## 2020-04-06 DIAGNOSIS — Z79899 Other long term (current) drug therapy: Secondary | ICD-10-CM | POA: Diagnosis not present

## 2020-04-06 DIAGNOSIS — D6861 Antiphospholipid syndrome: Secondary | ICD-10-CM | POA: Insufficient documentation

## 2020-04-06 LAB — PROTIME-INR
INR: 2.1 — ABNORMAL HIGH (ref 0.8–1.2)
Prothrombin Time: 22.6 seconds — ABNORMAL HIGH (ref 11.4–15.2)

## 2020-04-10 ENCOUNTER — Telehealth: Payer: Self-pay | Admitting: *Deleted

## 2020-04-10 NOTE — Telephone Encounter (Signed)
CALLED PATIENT TO INFORM OF PRE-SEED APPTS. FOR 05-03-20, LVM FOR A RETURN CALL

## 2020-04-10 NOTE — Telephone Encounter (Signed)
RETURNED PATIENT'S WIFE PHONE CALL, LVM FOR A RETURN CALL

## 2020-04-13 ENCOUNTER — Ambulatory Visit (HOSPITAL_COMMUNITY)
Admission: RE | Admit: 2020-04-13 | Discharge: 2020-04-13 | Disposition: A | Discharge status: Home / Self Care | Payer: BC Managed Care – PPO | Source: Ambulatory Visit | Attending: Interventional Radiology | Admitting: Interventional Radiology

## 2020-04-13 ENCOUNTER — Other Ambulatory Visit: Payer: Self-pay

## 2020-04-13 ENCOUNTER — Other Ambulatory Visit: Payer: Self-pay | Admitting: Urology

## 2020-04-13 ENCOUNTER — Encounter (HOSPITAL_COMMUNITY): Payer: Self-pay

## 2020-04-13 DIAGNOSIS — I81 Portal vein thrombosis: Secondary | ICD-10-CM | POA: Diagnosis present

## 2020-04-13 LAB — POCT I-STAT CREATININE: Creatinine, Ser: 1 mg/dL (ref 0.61–1.24)

## 2020-04-13 MED ORDER — IOHEXOL 350 MG/ML SOLN
100.0000 mL | Freq: Once | INTRAVENOUS | Status: AC | PRN
  Administered 2020-04-13: 100 mL via INTRAVENOUS

## 2020-04-18 ENCOUNTER — Other Ambulatory Visit: Payer: Self-pay | Admitting: Urology

## 2020-04-18 ENCOUNTER — Other Ambulatory Visit: Payer: Self-pay

## 2020-04-18 ENCOUNTER — Ambulatory Visit
Admission: RE | Admit: 2020-04-18 | Discharge: 2020-04-18 | Disposition: A | Discharge status: Home / Self Care | Payer: BC Managed Care – PPO | Source: Ambulatory Visit | Attending: Radiology | Admitting: Radiology

## 2020-04-18 ENCOUNTER — Encounter: Payer: Self-pay | Admitting: *Deleted

## 2020-04-18 DIAGNOSIS — I81 Portal vein thrombosis: Secondary | ICD-10-CM

## 2020-04-18 HISTORY — PX: IR RADIOLOGIST EVAL & MGMT: IMG5224

## 2020-04-18 NOTE — Progress Notes (Signed)
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  He presented approximately 2 months later with occluded TIPS and recurrent abdominal pain.  He was found to have antiphospholipid syndrome and therefore started on anticoagulation.  TIPS recanalization was attempted on 01/09/20 with inability to cannulate the central aspect of the TIPS and unsuccessful cannulation of the splenic vein via transplenic approach.    He has continued coumadin at the discretion of Dr. Lindi Adie, and is still adjusting his dosage to maintain adequate levels.  He was unfortunately diagnosed with prostate adenocarcinoma earlier this year and is under the care of Dr. Claudia Desanctis, planning for brachytherapy in a couple months.  He has also recently been diagnosed with type 2 diabetes and is taking glipizide.    As far as his abdominal pain, he states that overall it has gradually improved.  He reports occasional discomfort, but not bad enough to see care or require medication.  He has maintained his appetite.  No nausea/vomiting.     Past Medical History:  Diagnosis Date  . Portal vein thrombosis   . Prostate cancer [facility])     Past Surgical History:  Procedure Laterality Date  . IR RADIOLOGIST EVAL & MGMT  01/03/2020  . IR TIPS REVISION MOD SED  01/09/2020  . PROSTATE BIOPSY    . RADIOLOGY WITH ANESTHESIA N/A 01/09/2020   Procedure: IR WITH ANESTHESIA TIPS REVISION;  Surgeon: Radiologist, Medication, MD;  Location: [place];  Service: Radiology;  Laterality: N/A;  . TIPS PROCEDURE      Allergies: Patient has no known allergies.  Medications: Prior to  Admission medications   Medication Sig Start Date End Date Taking? Authorizing Provider  Blood Glucose Monitoring Suppl (CONTOUR NEXT EZ) w/Device KIT See admin instructions. 03/02/20   [provider]  CONTOUR NEXT TEST test strip daily. 03/01/20   [provider]  glipiZIDE (GLUCOTROL XL) 2.5 MG 24 hr tablet Take 2.5 mg by mouth daily. 02/22/20   [provider]  lisinopril (ZESTRIL) 2.5 MG tablet Take 2.5 mg by mouth daily. 02/22/20   [provider]  Microlet Lancets MISC daily. 03/01/20   [provider]  pravastatin (PRAVACHOL) 20 MG tablet Take 20 mg by mouth daily. 02/22/20   [provider]  warfarin (COUMADIN) 10 MG tablet Take 1 tablet (10 mg total) by mouth daily. 03/23/20   [name], MD  ranitidine (ZANTAC) 150 MG tablet Take 1 tablet (150 mg total) by mouth 2 (two) times daily. Patient not taking: Reported on 12/11/2019 03/19/18 12/11/19  Muthersbaugh, Jarrett Soho, PA-C     Family History  Problem Relation Age of Onset  . Prostate cancer Neg Hx   . Colon cancer Neg Hx   . Pancreatic cancer Neg Hx   . Breast cancer Neg Hx     Social History   Socioeconomic History  . Marital status: Married    Spouse name: [name]  . Number of children: 3  . Years of education: Not on file  . Highest education level: Not on file  Occupational History  . Not on file  Tobacco Use  . Smoking status: Former Smoker    Packs/day: 1.00  Years: 20.00    Pack years: 20.00    Types: Cigarettes    Quit date: 02/24/1994    Years since quitting: 26.1  . Smokeless tobacco: Never Used  Vaping Use  . Vaping Use: Never used  Substance and Sexual Activity  . Alcohol use: No  . Drug use: No  . Sexual activity: Yes  Other Topics Concern  . Not on file  Social History Narrative  . Not on file   Social Determinants of Health   Financial Resource Strain: High Risk  . Difficulty of Paying Living Expenses: Very hard  Food Insecurity: Food  Insecurity Present  . Worried About Charity fundraiser in the Last Year: Sometimes true  . Ran Out of Food in the Last Year: Not on file  Transportation Needs: Not on file  Physical Activity: Not on file  Stress: Not on file  Social Connections: Not on file     Vital Signs: There were no vitals taken for this visit.  No physical exam in lieu of telephone visit.   Imaging: CT AP 04/14/19 1. 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 The superior mesenteric vein and main portal vein are now very diminutive and difficult to distinctly appreciate, in keeping with expected evolution of chronic thrombosis. 2. TIPS is again seen and remains occluded. 3. Pancolonic diverticulosis without evidence of diverticulitis. 4. Prostatomegaly with urinary bladder wall thickening, likely due to chronic outlet obstruction.      Labs:  CBC: Recent Labs    12/12/19 0607 01/09/20 0803 01/09/20 1641 01/10/20 0239  WBC 4.8 4.1 8.3 7.4  HGB 12.3* 11.7* 12.5* 11.9*  HCT 37.8* 36.3* 38.9* 36.8*  PLT 294 285 282 282    COAGS: Recent Labs    12/11/19 0937 12/11/19 1540 12/11/19 2204 12/12/19 0607 01/09/20 0803 02/10/20 0923 02/23/20 1013 03/22/20 1434 04/06/20 1358  INR 1.2  --   --   --    < > 1.5* 1.8* 1.6* 2.1*  APTT 32 90* 109* 95*  --   --   --   --   --    < > = values in this interval not displayed.    BMP: Recent Labs    12/10/19 2205 12/12/19 0607 01/03/20 0837 01/09/20 0803 01/10/20 0239 04/13/20 1433  NA 142 136  --  133* 137  --   K 4.2 3.9  --  4.6 4.9  --   CL 111 104  --  104 104  --   CO2 23 23  --  20* 26  --   GLUCOSE 113* 94  --  101* 127*  --   BUN 15 12  --  15 14  --   CALCIUM 9.5 9.2  --  9.2 9.9  --   CREATININE 1.04 1.00 1.10 1.05 1.11 1.00  GFRNONAA >60 >60  --  >60 >60  --     LIVER FUNCTION TESTS: Recent Labs    12/10/19 2205 12/12/19 0607 01/09/20 0803 01/10/20 0239  BILITOT 0.9 1.3* 1.0  0.8  AST 62* 32 62* 39  ALT 71* 54* 146* 119*  ALKPHOS 92 86 85 77  PROT 7.2 7.1 6.6 7.0  ALBUMIN 3.7 3.7 3.4* 3.4*    Assessment and Plan: 65 year old male with 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  He is overall compensating well from a  portal standpoint without CT evidence of significant portal hypertension.  No indication for re-attempt at TIPS recanalization at this time.  Inciting factors for initial portal thrombus likely multifactorial including antiphospholipid syndrome, prostate malignancy, and possible underlying congential cavernous transformation of the portal vein.    Return to IR clinic in 1 year or sooner if abdominal pain worsens.  Agree with continuing anticoagulation.  Electronically Signed: Suzette Battiest 04/18/2020, 1:05 PM   I spent a total of 25 Minutes in face to face in clinical consultation, greater than 50% of which was counseling/coordinating care for portal vein thrombus.

## 2020-04-19 ENCOUNTER — Inpatient Hospital Stay: Payer: BC Managed Care – PPO

## 2020-04-19 ENCOUNTER — Other Ambulatory Visit: Payer: Self-pay

## 2020-04-19 DIAGNOSIS — D6861 Antiphospholipid syndrome: Secondary | ICD-10-CM

## 2020-04-19 LAB — PROTIME-INR
INR: 2.2 — ABNORMAL HIGH (ref 0.8–1.2)
Prothrombin Time: 23.4 seconds — ABNORMAL HIGH (ref 11.4–15.2)

## 2020-04-20 ENCOUNTER — Inpatient Hospital Stay: Payer: BC Managed Care – PPO

## 2020-05-02 ENCOUNTER — Telehealth: Payer: Self-pay | Admitting: *Deleted

## 2020-05-02 NOTE — Telephone Encounter (Signed)
CALLED PATIENT TO REMIND OF PRE-SEED APPTS. FOR 05-03-20, SPOKE WITH PATIENT AND HE IS AWARE OF THESE APPTS.

## 2020-05-03 ENCOUNTER — Ambulatory Visit (HOSPITAL_COMMUNITY)
Admission: RE | Admit: 2020-05-03 | Discharge: 2020-05-03 | Disposition: A | Discharge status: Home / Self Care | Payer: BC Managed Care – PPO | Source: Ambulatory Visit | Attending: Urology | Admitting: Urology

## 2020-05-03 ENCOUNTER — Ambulatory Visit
Admission: RE | Admit: 2020-05-03 | Discharge: 2020-05-03 | Disposition: A | Discharge status: Home / Self Care | Payer: BC Managed Care – PPO | Source: Ambulatory Visit | Attending: Urology | Admitting: Urology

## 2020-05-03 ENCOUNTER — Ambulatory Visit
Admission: RE | Admit: 2020-05-03 | Discharge: 2020-05-03 | Disposition: A | Discharge status: Home / Self Care | Payer: BC Managed Care – PPO | Source: Ambulatory Visit | Attending: Radiation Oncology | Admitting: Radiation Oncology

## 2020-05-03 ENCOUNTER — Encounter (HOSPITAL_COMMUNITY)
Admission: RE | Admit: 2020-05-03 | Discharge: 2020-05-03 | Disposition: A | Discharge status: Home / Self Care | Payer: BC Managed Care – PPO | Source: Ambulatory Visit | Attending: Urology | Admitting: Urology

## 2020-05-03 ENCOUNTER — Other Ambulatory Visit: Payer: Self-pay

## 2020-05-03 DIAGNOSIS — C61 Malignant neoplasm of prostate: Secondary | ICD-10-CM | POA: Diagnosis not present

## 2020-05-03 DIAGNOSIS — Z01818 Encounter for other preprocedural examination: Secondary | ICD-10-CM | POA: Diagnosis not present

## 2020-05-03 NOTE — Progress Notes (Signed)
  Radiation Oncology         [phone]) [phone] ________________________________  Name: Harry Lucero MRN: [id number]  Date: 05/03/2020  DOB: 1955/12/06  SIMULATION AND TREATMENT PLANNING NOTE PUBIC ARCH STUDY  PJ:ASNKN, Apolonio Schneiders, NP  Robley Fries, MD  DIAGNOSIS: 65 y.o. gentleman with Stage T1c adenocarcinoma of the prostate with Gleason score of 3+4, and PSA of 10.  Oncology History  Malignant neoplasm of prostate ([place])  01/30/2020 Cancer Staging   Staging form: Prostate, AJCC 8th Edition - Clinical stage from 01/30/2020: Stage IIB (cT1c, cN0, cM0, PSA: 10, Grade Group: 2) - Signed by Freeman Caldron, PA-C on 03/27/2020 Histopathologic type: Adenocarcinoma, NOS Stage prefix: Initial diagnosis Prostate specific antigen (PSA) range: 10 to 19 Gleason primary pattern: 3 Gleason secondary pattern: 4 Gleason score: 7 Histologic grading system: 5 grade system Number of biopsy cores examined: 12 Number of biopsy cores positive: 3 Location of positive needle core biopsies: Both sides   03/27/2020 Initial Diagnosis   Malignant neoplasm of prostate ([place])       ICD-10-CM   1. Malignant neoplasm of prostate ([place])  C61     COMPLEX SIMULATION:  The patient presented today for evaluation for possible prostate seed implant. He was brought to the radiation planning suite and placed supine on the CT couch. A 3-dimensional image study set was obtained in upload to the planning computer. There, on each axial slice, I contoured the prostate gland. Then, using three-dimensional radiation planning tools I reconstructed the prostate in view of the structures from the transperineal needle pathway to assess for possible pubic arch interference. In doing so, I did not appreciate any pubic arch interference. Also, the patient's prostate volume was estimated based on the drawn structure. The volume was 47 cc.  Given the pubic arch appearance and prostate volume, patient remains a good candidate to proceed with  prostate seed implant. Today, he freely provided informed written consent to proceed.    PLAN: The patient will undergo prostate seed implant.   ________________________________  Sheral Apley. Tammi Klippel, M.D.

## 2020-05-04 ENCOUNTER — Inpatient Hospital Stay: Payer: BC Managed Care – PPO | Attending: Hematology and Oncology

## 2020-05-04 DIAGNOSIS — D6862 Lupus anticoagulant syndrome: Secondary | ICD-10-CM | POA: Insufficient documentation

## 2020-05-04 DIAGNOSIS — Z7901 Long term (current) use of anticoagulants: Secondary | ICD-10-CM | POA: Insufficient documentation

## 2020-05-04 DIAGNOSIS — C61 Malignant neoplasm of prostate: Secondary | ICD-10-CM | POA: Insufficient documentation

## 2020-05-04 DIAGNOSIS — D6861 Antiphospholipid syndrome: Secondary | ICD-10-CM | POA: Diagnosis present

## 2020-05-04 LAB — PROTIME-INR
INR: 2.7 — ABNORMAL HIGH (ref 0.8–1.2)
Prothrombin Time: 27.9 seconds — ABNORMAL HIGH (ref 11.4–15.2)

## 2020-05-14 ENCOUNTER — Telehealth: Payer: Self-pay | Admitting: Hematology and Oncology

## 2020-05-14 ENCOUNTER — Other Ambulatory Visit: Payer: Self-pay | Admitting: *Deleted

## 2020-05-14 DIAGNOSIS — I81 Portal vein thrombosis: Secondary | ICD-10-CM

## 2020-05-14 NOTE — Progress Notes (Signed)
Patient Care Team: Simona Huh, NP as PCP - General (Nurse Practitioner) Cira Rue, RN as Oncology Nurse Navigator  DIAGNOSIS:    ICD-10-CM   1. Antiphospholipid antibody syndrome ([place])  D68.61     SUMMARY OF ONCOLOGIC HISTORY: Oncology History  Malignant neoplasm of prostate ([place])  01/30/2020 Cancer Staging   Staging form: Prostate, AJCC 8th Edition - Clinical stage from 01/30/2020: Stage IIB (cT1c, cN0, cM0, PSA: 10, Grade Group: 2) - Signed by Freeman Caldron, PA-C on 03/27/2020 Histopathologic type: Adenocarcinoma, NOS Stage prefix: Initial diagnosis Prostate specific antigen (PSA) range: 10 to 19 Gleason primary pattern: 3 Gleason secondary pattern: 4 Gleason score: 7 Histologic grading system: 5 grade system Number of biopsy cores examined: 12 Number of biopsy cores positive: 3 Location of positive needle core biopsies: Both sides   03/27/2020 Initial Diagnosis   Malignant neoplasm of prostate ([place])     CHIEF COMPLIANT: Follow-up ofacuteportalvenous thrombosis  INTERVAL HISTORY: Harry Lucero is a 65 y.o. with above-mentioned history of acuteportalvenous thrombosiscurrently onCoumadin. 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  He presents to the clinic todayfor follow-up.  ALLERGIES:  has No Known Allergies.  MEDICATIONS:  Current Outpatient Medications  Medication Sig Dispense Refill   Blood Glucose Monitoring Suppl (CONTOUR NEXT EZ) w/Device KIT See admin instructions.     CONTOUR NEXT TEST test strip daily.     glipiZIDE (GLUCOTROL XL) 2.5 MG 24 hr tablet Take 2.5 mg by mouth daily.     lisinopril (ZESTRIL) 2.5 MG tablet Take 2.5 mg by mouth daily.     Microlet Lancets MISC daily.     pravastatin (PRAVACHOL) 20 MG tablet Take 20 mg by mouth daily.     warfarin (COUMADIN) 10 MG tablet Take 1 tablet (10 mg total) by mouth daily. 90 tablet 3   No current facility-administered  medications for this visit.    PHYSICAL EXAMINATION: ECOG PERFORMANCE STATUS: 1 - Symptomatic but completely ambulatory  There were no vitals filed for this visit. There were no vitals filed for this visit.   LABORATORY DATA:  I have reviewed the data as listed CMP Latest Ref Rng & Units 04/13/2020 01/10/2020 01/09/2020  Glucose 70 - 99 mg/dL - 127(H) 101(H)  BUN 8 - 23 mg/dL - 14 15  Creatinine 0.61 - 1.24 mg/dL 1.00 1.11 1.05  Sodium 135 - 145 mmol/L - 137 133(L)  Potassium 3.5 - 5.1 mmol/L - 4.9 4.6  Chloride 98 - 111 mmol/L - 104 104  CO2 22 - 32 mmol/L - 26 20(L)  Calcium 8.9 - 10.3 mg/dL - 9.9 9.2  Total Protein 6.5 - 8.1 g/dL - 7.0 6.6  Total Bilirubin 0.3 - 1.2 mg/dL - 0.8 1.0  Alkaline Phos 38 - 126 U/L - 77 85  AST 15 - 41 U/L - 39 62(H)  ALT 0 - 44 U/L - 119(H) 146(H)    Lab Results  Component Value Date   WBC 7.4 01/10/2020   HGB 11.9 (L) 01/10/2020   HCT 36.8 (L) 01/10/2020   MCV 83.4 01/10/2020   PLT 282 01/10/2020    ASSESSMENT & PLAN:  Antiphospholipid antibody syndrome (HCC) Portal vein thrombosisdiagnosed September [facility] at [facility] when he presented with abdominal pain(positive for lupus anticoagulant) SMV thrombosis Splenic vein thrombosis  Current treatment:Switched to Coumadin 01/09/2020 (when he was diagnosed with positive lupus anticoagulant)  Hospitalization:01/09/2020-01/10/2020 attemptedTIPSrecannulization procedure in IR, unsuccessful Continue with Coumadin therapy with close monitoring. Lab review: 01/27/2020:  5.7 °02/23/2020: INR 1.8 °03/22/2020: INR 1.6 (his INR is subtherapeutic because he is drinking green juice with lots of green leafy vegetables daily) Coumadin dose increased to 10 mg °05/04/2020: INR 2.7 (at therapeutic range) ° °Newly diagnosed prostate cancer: Seeing Dr. Manning for prostate seed placement  °He will hold Coumadin for 5 days prior to the procedure. °Resume after the seed placement. ° ° °No orders of  the defined types were placed in this encounter. ° °The patient has a good understanding of the overall plan. he agrees with it. he will call with any problems that may develop before the next visit here. ° °Total time spent: 20 mins including face to face time and time spent for planning, charting and coordination of care ° °Harry K Gudena, MD, MPH °05/15/2020 ° °I, Molly Dorshimer, am acting as scribe for Dr. Vinay Lucero. ° °I have reviewed the above documentation for accuracy and completeness, and I agree with the above. ° ° ° ° ° ° °

## 2020-05-14 NOTE — Telephone Encounter (Signed)
R/s 3/24 appt to 3/22 per patient request. Called and spoke with patient. Confirmed new date and time

## 2020-05-15 ENCOUNTER — Inpatient Hospital Stay: Payer: BC Managed Care – PPO

## 2020-05-15 ENCOUNTER — Telehealth: Payer: Self-pay | Admitting: Hematology and Oncology

## 2020-05-15 ENCOUNTER — Ambulatory Visit: Payer: BC Managed Care – PPO | Admitting: Hematology and Oncology

## 2020-05-15 ENCOUNTER — Other Ambulatory Visit: Payer: Self-pay

## 2020-05-15 ENCOUNTER — Inpatient Hospital Stay (HOSPITAL_BASED_OUTPATIENT_CLINIC_OR_DEPARTMENT_OTHER): Payer: BC Managed Care – PPO | Admitting: Hematology and Oncology

## 2020-05-15 DIAGNOSIS — D6861 Antiphospholipid syndrome: Secondary | ICD-10-CM | POA: Diagnosis not present

## 2020-05-15 DIAGNOSIS — I81 Portal vein thrombosis: Secondary | ICD-10-CM

## 2020-05-15 LAB — PROTIME-INR
INR: 3.1 — ABNORMAL HIGH (ref 0.8–1.2)
Prothrombin Time: 31.1 seconds — ABNORMAL HIGH (ref 11.4–15.2)

## 2020-05-15 NOTE — Telephone Encounter (Signed)
I left a voicemail for the patient that the INR was 3.1 and therefore I recommended that he take 10 mg Monday through Friday and then Saturday and Sunday to take 5 mg

## 2020-05-15 NOTE — Assessment & Plan Note (Signed)
Portal vein thrombosisdiagnosed September [facility] when he presented with abdominal pain(positive for lupus anticoagulant) SMV thrombosis Splenic vein thrombosis  Current treatment:Switched to Coumadin 01/09/2020 (when he was diagnosed with positive lupus anticoagulant)  Hospitalization:01/09/2020-01/10/2020 attemptedTIPSrecannulization procedure in IR, unsuccessful Continue with Coumadin therapy with close monitoring. Lab review: 01/27/2020: INR 5.7 02/23/2020: INR 1.8 03/22/2020: INR 1.6 (his INR is subtherapeutic because he is drinking green juice with lots of green leafy vegetables daily) Coumadin dose increased to 10 mg 05/04/2020: INR 2.7 (at therapeutic range)  Newly diagnosed prostate cancer: Seeing Dr. Tammi Klippel for prostate seed placement

## 2020-05-16 ENCOUNTER — Other Ambulatory Visit: Payer: Self-pay | Admitting: Hematology and Oncology

## 2020-05-17 ENCOUNTER — Inpatient Hospital Stay: Payer: BC Managed Care – PPO | Admitting: Hematology and Oncology

## 2020-05-24 ENCOUNTER — Encounter (HOSPITAL_BASED_OUTPATIENT_CLINIC_OR_DEPARTMENT_OTHER): Payer: Self-pay | Admitting: Urology

## 2020-05-24 ENCOUNTER — Other Ambulatory Visit: Payer: Self-pay

## 2020-05-24 ENCOUNTER — Telehealth: Payer: Self-pay | Admitting: *Deleted

## 2020-05-24 NOTE — Telephone Encounter (Signed)
CALLED PATIENT TO REMIND OF LAB AND COVID TESTING FOR 05-28-20, SPOKE WITH PATIENT AND HE IS AWARE OF THESE APPTS.

## 2020-05-24 NOTE — Progress Notes (Addendum)
Spoke w/ via phone for pre-op interview---pt Lab needs dos----   none            Lab results------ekg 12-11-2019 epic, chest ray 05-04-2020 epic, has lab appt 05-28-2020 1130 am for cbcb cmp pt ptt COVID test ------05-28-2020 1245 Arrive at -------1100 am 05-31-2020 NPO after MN NO Solid Food.  Clear liquids from MN until---1000 am then npo Med rec completed Medications to take morning of surgery -----pravastatin Diabetic medication -----none day of surgery Patient instructed to bring photo id and insurance card day of surgery Patient aware to have Driver (ride ) / caregiver    for 24 hours after surgery  Patient Special Instructions -----fleets enema day of surgery Pre-Op special Istructions -----none Patient verbalized understanding of instructions that were given at this phone interview. Patient denies shortness of breath, chest pain, fever, cough at this phone interview.  lov dr Lindi Adie 05-15-2020 chart/epic instructs pt to stop warfarin 5 days before surgery, pt aware to stop coumadin 5 days before surgery  Addendum: pt 24.7 and int 2.3 at pre op labs today, repeat pt ordered per anesthesia guidelines for [facility]

## 2020-05-28 ENCOUNTER — Other Ambulatory Visit (HOSPITAL_COMMUNITY)
Admission: RE | Admit: 2020-05-28 | Discharge: 2020-05-28 | Disposition: A | Discharge status: Home / Self Care | Payer: BC Managed Care – PPO | Source: Ambulatory Visit | Attending: Urology | Admitting: Urology

## 2020-05-28 ENCOUNTER — Other Ambulatory Visit: Payer: Self-pay

## 2020-05-28 ENCOUNTER — Encounter (HOSPITAL_COMMUNITY)
Admission: RE | Admit: 2020-05-28 | Discharge: 2020-05-28 | Disposition: A | Discharge status: Home / Self Care | Payer: BC Managed Care – PPO | Source: Ambulatory Visit | Attending: Urology | Admitting: Urology

## 2020-05-28 DIAGNOSIS — Z20822 Contact with and (suspected) exposure to covid-19: Secondary | ICD-10-CM | POA: Insufficient documentation

## 2020-05-28 DIAGNOSIS — Z7984 Long term (current) use of oral hypoglycemic drugs: Secondary | ICD-10-CM | POA: Diagnosis not present

## 2020-05-28 DIAGNOSIS — Z8616 Personal history of COVID-19: Secondary | ICD-10-CM | POA: Diagnosis not present

## 2020-05-28 DIAGNOSIS — Z87891 Personal history of nicotine dependence: Secondary | ICD-10-CM | POA: Diagnosis not present

## 2020-05-28 DIAGNOSIS — Z86718 Personal history of other venous thrombosis and embolism: Secondary | ICD-10-CM | POA: Diagnosis not present

## 2020-05-28 DIAGNOSIS — Z7901 Long term (current) use of anticoagulants: Secondary | ICD-10-CM | POA: Diagnosis not present

## 2020-05-28 DIAGNOSIS — Z01812 Encounter for preprocedural laboratory examination: Secondary | ICD-10-CM | POA: Insufficient documentation

## 2020-05-28 DIAGNOSIS — E119 Type 2 diabetes mellitus without complications: Secondary | ICD-10-CM | POA: Diagnosis not present

## 2020-05-28 DIAGNOSIS — Z79899 Other long term (current) drug therapy: Secondary | ICD-10-CM | POA: Diagnosis not present

## 2020-05-28 DIAGNOSIS — C61 Malignant neoplasm of prostate: Secondary | ICD-10-CM | POA: Diagnosis present

## 2020-05-28 LAB — CBC
HCT: 38.9 % — ABNORMAL LOW (ref 39.0–52.0)
Hemoglobin: 12.7 g/dL — ABNORMAL LOW (ref 13.0–17.0)
MCH: 26.8 pg (ref 26.0–34.0)
MCHC: 32.6 g/dL (ref 30.0–36.0)
MCV: 82.2 fL (ref 80.0–100.0)
Platelets: 257 10*3/uL (ref 150–400)
RBC: 4.73 MIL/uL (ref 4.22–5.81)
RDW: 16.3 % — ABNORMAL HIGH (ref 11.5–15.5)
WBC: 3 10*3/uL — ABNORMAL LOW (ref 4.0–10.5)
nRBC: 0 % (ref 0.0–0.2)

## 2020-05-28 LAB — SARS CORONAVIRUS 2 (TAT 6-24 HRS): SARS Coronavirus 2: NEGATIVE

## 2020-05-28 LAB — COMPREHENSIVE METABOLIC PANEL
ALT: 78 U/L — ABNORMAL HIGH (ref 0–44)
AST: 90 U/L — ABNORMAL HIGH (ref 15–41)
Albumin: 3.7 g/dL (ref 3.5–5.0)
Alkaline Phosphatase: 65 U/L (ref 38–126)
Anion gap: 5 (ref 5–15)
BUN: 16 mg/dL (ref 8–23)
CO2: 24 mmol/L (ref 22–32)
Calcium: 9.6 mg/dL (ref 8.9–10.3)
Chloride: 110 mmol/L (ref 98–111)
Creatinine, Ser: 1.04 mg/dL (ref 0.61–1.24)
GFR, Estimated: >60 mL/min (ref >60)
Glucose, Bld: 111 mg/dL — ABNORMAL HIGH (ref 70–99)
Potassium: 4.7 mmol/L (ref 3.5–5.1)
Sodium: 139 mmol/L (ref 135–145)
Total Bilirubin: 1.5 mg/dL — ABNORMAL HIGH (ref 0.3–1.2)
Total Protein: 7.1 g/dL (ref 6.5–8.1)

## 2020-05-28 LAB — PROTIME-INR
INR: 2.3 — ABNORMAL HIGH (ref 0.8–1.2)
Prothrombin Time: 24.7 seconds — ABNORMAL HIGH (ref 11.4–15.2)

## 2020-05-28 LAB — APTT: aPTT: 36 seconds (ref 24–36)

## 2020-05-30 ENCOUNTER — Other Ambulatory Visit: Payer: Self-pay | Admitting: Urology

## 2020-05-30 ENCOUNTER — Telehealth: Payer: Self-pay | Admitting: *Deleted

## 2020-05-30 NOTE — Telephone Encounter (Signed)
CALLED PATIENT TO REMIND OF PROCEDURE FOR 05-31-20, SPOKE WITH  PATIENT AND HE IS AWARE OF THIS PROCEDURE

## 2020-05-31 ENCOUNTER — Ambulatory Visit (HOSPITAL_COMMUNITY): Payer: BC Managed Care – PPO

## 2020-05-31 ENCOUNTER — Other Ambulatory Visit: Payer: Self-pay

## 2020-05-31 ENCOUNTER — Ambulatory Visit (HOSPITAL_BASED_OUTPATIENT_CLINIC_OR_DEPARTMENT_OTHER): Payer: BC Managed Care – PPO | Admitting: Anesthesiology

## 2020-05-31 ENCOUNTER — Ambulatory Visit (HOSPITAL_BASED_OUTPATIENT_CLINIC_OR_DEPARTMENT_OTHER)
Admission: RE | Admit: 2020-05-31 | Discharge: 2020-05-31 | Disposition: A | Discharge status: Home / Self Care | Payer: BC Managed Care – PPO | Source: Ambulatory Visit | Attending: Urology | Admitting: Urology

## 2020-05-31 ENCOUNTER — Encounter (HOSPITAL_BASED_OUTPATIENT_CLINIC_OR_DEPARTMENT_OTHER): Payer: Self-pay | Admitting: Urology

## 2020-05-31 ENCOUNTER — Encounter (HOSPITAL_BASED_OUTPATIENT_CLINIC_OR_DEPARTMENT_OTHER)
Admission: RE | Disposition: A | Discharge status: Home / Self Care | Payer: Self-pay | Source: Ambulatory Visit | Attending: Urology

## 2020-05-31 DIAGNOSIS — Z8616 Personal history of COVID-19: Secondary | ICD-10-CM | POA: Insufficient documentation

## 2020-05-31 DIAGNOSIS — C61 Malignant neoplasm of prostate: Secondary | ICD-10-CM | POA: Diagnosis not present

## 2020-05-31 DIAGNOSIS — Z7984 Long term (current) use of oral hypoglycemic drugs: Secondary | ICD-10-CM | POA: Insufficient documentation

## 2020-05-31 DIAGNOSIS — Z20822 Contact with and (suspected) exposure to covid-19: Secondary | ICD-10-CM | POA: Insufficient documentation

## 2020-05-31 DIAGNOSIS — E119 Type 2 diabetes mellitus without complications: Secondary | ICD-10-CM | POA: Insufficient documentation

## 2020-05-31 DIAGNOSIS — Z7901 Long term (current) use of anticoagulants: Secondary | ICD-10-CM | POA: Insufficient documentation

## 2020-05-31 DIAGNOSIS — Z79899 Other long term (current) drug therapy: Secondary | ICD-10-CM | POA: Insufficient documentation

## 2020-05-31 DIAGNOSIS — Z86718 Personal history of other venous thrombosis and embolism: Secondary | ICD-10-CM | POA: Insufficient documentation

## 2020-05-31 DIAGNOSIS — Z87891 Personal history of nicotine dependence: Secondary | ICD-10-CM | POA: Insufficient documentation

## 2020-05-31 HISTORY — DX: Presence of spectacles and contact lenses: Z97.3

## 2020-05-31 HISTORY — DX: COVID-19: U07.1

## 2020-05-31 HISTORY — PX: RADIOACTIVE SEED IMPLANT: SHX5150

## 2020-05-31 HISTORY — DX: Antiphospholipid syndrome: D68.61

## 2020-05-31 HISTORY — DX: Headache, unspecified: R51.9

## 2020-05-31 HISTORY — DX: Adverse effect of angiotensin-converting-enzyme inhibitors, initial encounter: T46.4X5A

## 2020-05-31 HISTORY — DX: Prediabetes: R73.03

## 2020-05-31 HISTORY — DX: Essential (primary) hypertension: I10

## 2020-05-31 HISTORY — DX: Other specified cough: R05.8

## 2020-05-31 HISTORY — PX: CYSTOSCOPY: SHX5120

## 2020-05-31 HISTORY — DX: Raised antibody titer: R76.0

## 2020-05-31 LAB — PROTIME-INR
INR: 1.2 (ref 0.8–1.2)
Prothrombin Time: 14.4 seconds (ref 11.4–15.2)

## 2020-05-31 LAB — GLUCOSE, CAPILLARY: Glucose-Capillary: 93 mg/dL (ref 70–99)

## 2020-05-31 SURGERY — INSERTION, RADIATION SOURCE, PROSTATE
Anesthesia: General | Site: Prostate

## 2020-05-31 MED ORDER — ONDANSETRON HCL 4 MG/2ML IJ SOLN
INTRAMUSCULAR | Status: DC | PRN
  Administered 2020-05-31: 4 mg via INTRAVENOUS

## 2020-05-31 MED ORDER — SUGAMMADEX SODIUM 200 MG/2ML IV SOLN
INTRAVENOUS | Status: DC | PRN
  Administered 2020-05-31: 200 mg via INTRAVENOUS

## 2020-05-31 MED ORDER — STERILE WATER FOR IRRIGATION IR SOLN
Status: DC | PRN
  Administered 2020-05-31: 3 mL

## 2020-05-31 MED ORDER — FENTANYL CITRATE (PF) 100 MCG/2ML IJ SOLN
25.0000 ug | INTRAMUSCULAR | Status: DC | PRN
  Administered 2020-05-31 (×2): 25 ug via INTRAVENOUS

## 2020-05-31 MED ORDER — ROCURONIUM BROMIDE 10 MG/ML (PF) SYRINGE
PREFILLED_SYRINGE | INTRAVENOUS | Status: AC
  Filled 2020-05-31: qty 10

## 2020-05-31 MED ORDER — LIDOCAINE HCL (CARDIAC) PF 100 MG/5ML IV SOSY
PREFILLED_SYRINGE | INTRAVENOUS | Status: DC | PRN
  Administered 2020-05-31: 100 mg via INTRAVENOUS

## 2020-05-31 MED ORDER — PROPOFOL 10 MG/ML IV BOLUS
INTRAVENOUS | Status: DC | PRN
  Administered 2020-05-31: 150 mg via INTRAVENOUS

## 2020-05-31 MED ORDER — GLYCOPYRROLATE 0.2 MG/ML IJ SOLN
INTRAMUSCULAR | Status: DC | PRN
  Administered 2020-05-31: .1 mg via INTRAVENOUS

## 2020-05-31 MED ORDER — IOHEXOL 300 MG/ML  SOLN
INTRAMUSCULAR | Status: DC | PRN
  Administered 2020-05-31: 7 mL

## 2020-05-31 MED ORDER — CEFAZOLIN SODIUM-DEXTROSE 2-4 GM/100ML-% IV SOLN
INTRAVENOUS | Status: AC
  Filled 2020-05-31: qty 100

## 2020-05-31 MED ORDER — CEFAZOLIN SODIUM-DEXTROSE 2-4 GM/100ML-% IV SOLN
2.0000 g | Freq: Once | INTRAVENOUS | Status: AC
  Administered 2020-05-31: 2 g via INTRAVENOUS

## 2020-05-31 MED ORDER — LACTATED RINGERS IV SOLN: INTRAVENOUS | Status: DC

## 2020-05-31 MED ORDER — GLYCOPYRROLATE PF 0.2 MG/ML IJ SOSY
PREFILLED_SYRINGE | INTRAMUSCULAR | Status: AC
  Filled 2020-05-31: qty 2

## 2020-05-31 MED ORDER — TRAMADOL HCL 50 MG PO TABS: 50.0000 mg | ORAL_TABLET | Freq: Four times a day (QID) | ORAL | 0 refills | Status: AC | PRN

## 2020-05-31 MED ORDER — MIDAZOLAM HCL 2 MG/2ML IJ SOLN
INTRAMUSCULAR | Status: DC | PRN
  Administered 2020-05-31: 1 mg via INTRAVENOUS

## 2020-05-31 MED ORDER — FENTANYL CITRATE (PF) 100 MCG/2ML IJ SOLN
INTRAMUSCULAR | Status: AC
  Filled 2020-05-31: qty 2

## 2020-05-31 MED ORDER — SODIUM CHLORIDE 0.9 % IR SOLN
Status: DC | PRN
  Administered 2020-05-31: 200 mL

## 2020-05-31 MED ORDER — PROPOFOL 10 MG/ML IV BOLUS
INTRAVENOUS | Status: AC
  Filled 2020-05-31: qty 60

## 2020-05-31 MED ORDER — FENTANYL CITRATE (PF) 100 MCG/2ML IJ SOLN
INTRAMUSCULAR | Status: DC | PRN
  Administered 2020-05-31 (×2): 50 ug via INTRAVENOUS

## 2020-05-31 MED ORDER — KETOROLAC TROMETHAMINE 30 MG/ML IJ SOLN: 30.0000 mg | Freq: Once | INTRAMUSCULAR | Status: DC | PRN

## 2020-05-31 MED ORDER — DEXAMETHASONE SODIUM PHOSPHATE 4 MG/ML IJ SOLN
INTRAMUSCULAR | Status: DC | PRN
  Administered 2020-05-31: 8 mg via INTRAVENOUS

## 2020-05-31 MED ORDER — ROCURONIUM BROMIDE 100 MG/10ML IV SOLN
INTRAVENOUS | Status: DC | PRN
  Administered 2020-05-31: 60 mg via INTRAVENOUS

## 2020-05-31 MED ORDER — MIDAZOLAM HCL 2 MG/2ML IJ SOLN
INTRAMUSCULAR | Status: AC
  Filled 2020-05-31: qty 2

## 2020-05-31 MED ORDER — ONDANSETRON HCL 4 MG/2ML IJ SOLN: 4.0000 mg | Freq: Once | INTRAMUSCULAR | Status: DC | PRN

## 2020-05-31 MED ORDER — FLEET ENEMA 7-19 GM/118ML RE ENEM: 1.0000 | ENEMA | Freq: Once | RECTAL | Status: DC

## 2020-05-31 SURGICAL SUPPLY — 30 items
BAG DRN RND TRDRP ANRFLXCHMBR (UROLOGICAL SUPPLIES) ×2
BAG URINE DRAIN 2000ML AR STRL (UROLOGICAL SUPPLIES) ×3 IMPLANT
BLADE CLIPPER SENSICLIP SURGIC (BLADE) ×3 IMPLANT
CATH FOLEY 2WAY SLVR  5CC 16FR (CATHETERS) ×1
CATH FOLEY 2WAY SLVR 5CC 16FR (CATHETERS) ×2 IMPLANT
CATH ROBINSON RED A/P 20FR (CATHETERS) ×3 IMPLANT
CLOTH BEACON ORANGE TIMEOUT ST (SAFETY) ×3 IMPLANT
CNTNR URN SCR LID CUP LEK RST (MISCELLANEOUS) ×4 IMPLANT
CONT SPEC 4OZ STRL OR WHT (MISCELLANEOUS) ×6
COVER BACK TABLE 60X90IN (DRAPES) ×3 IMPLANT
COVER MAYO STAND STRL (DRAPES) ×3 IMPLANT
DRSG TEGADERM 4X4.75 (GAUZE/BANDAGES/DRESSINGS) ×3 IMPLANT
DRSG TEGADERM 8X12 (GAUZE/BANDAGES/DRESSINGS) ×3 IMPLANT
GAUZE SPONGE 4X4 12PLY STRL LF (GAUZE/BANDAGES/DRESSINGS) ×3 IMPLANT
GLOVE SURG ENC MOIS LTX SZ6.5 (GLOVE) ×9 IMPLANT
GLOVE SURG ENC MOIS LTX SZ8 (GLOVE) ×6 IMPLANT
GLOVE SURG UNDER POLY LF SZ7 (GLOVE) ×9 IMPLANT
GOWN STRL REUS W/TWL LRG LVL3 (GOWN DISPOSABLE) ×9 IMPLANT
GOWN STRL REUS W/TWL XL LVL3 (GOWN DISPOSABLE) ×3 IMPLANT
HOLDER FOLEY CATH W/STRAP (MISCELLANEOUS) ×3 IMPLANT
I-Seed AgX 100 ×195 IMPLANT
IV NS 1000ML (IV SOLUTION) ×3
IV NS 1000ML BAXH (IV SOLUTION) ×2 IMPLANT
KIT TURNOVER CYSTO (KITS) ×3 IMPLANT
MARKER SKIN DUAL TIP RULER LAB (MISCELLANEOUS) ×3 IMPLANT
PACK CYSTO (CUSTOM PROCEDURE TRAY) ×3 IMPLANT
SYR 10ML LL (SYRINGE) ×6 IMPLANT
TOWEL OR 17X26 10 PK STRL BLUE (TOWEL DISPOSABLE) ×3 IMPLANT
UNDERPAD 30X36 HEAVY ABSORB (UNDERPADS AND DIAPERS) ×6 IMPLANT
WATER STERILE IRR 500ML POUR (IV SOLUTION) ×3 IMPLANT

## 2020-05-31 NOTE — H&P (Signed)
CC/HPI: Harry Lucero is a 65 year old referred to discuss his new diagnosis of prostate cancer and definitive treatment options.   Patient underwent prostate biopsy on 01/30/2020 for an elevated PSA of 11.19 ng/mL. Biopsy revealed GS 3+4=7 in 2/12 cores and 3+3 = 6 in 1/12 core, adenocarcinoma of the prostate with 3 cores positive (10-30%), TRUS volume of 53 cm3. He has no family history of prostate cancer. Denies new or worsening bone or back pain. Good appetite and stable weight.   AUA SS: No bothersome LUTS  SHIM: Reports mild erectile dysfunction  ECOG: 0   Imaging studies:  CT A/P 01/03/2020 persistent occlusion of indwelling TI PS stent. There is an unchanged 6 mm solid pulmonary nodule in the posterior right lower lobe  MRI A/P 12/11/2019 with TI PS occlusion. No pancreatic mass evident.   PMH: Diabetes, portal vein thrombosis s/p TIPS in 10/2019 with occlusion in 11/2019  PSH: TIPS on 11/02/2019   TNM stage: Clinical stage [id number] adenocarcinoma the prostate  PSA: of 11.19  Gleason score: GS 3+4=7 in 2/12 cores and 3+3 = 6 in 1/12 core  Biopsy: 01/30/2020  Left: 3+4 = 7 in the left mid  Right: 3+4 = 7in the right lateral mid and 3+3 = 6 in right lateral apex  Prostate volume: 53 g  PSAD: 0.211   Nomogram  OC disease: 99% at 10 and 15 years  EPE: 40%  SVI: 4%  LNI: 4%  PFS (5 year, 10 year): 80, 68%   He does take warfarin for indwelling TIPS. Has hx of portal vein thrombosis s/p TIPS with recent occlusion in 11/2019.     ALLERGIES: No Known Drug Allergies    MEDICATIONS: Lisinopril  Eliquis  Glipizide  Pravastatin Sodium  Tramadol Hcl 50 mg tablet  Vitamin D     GU PSH: Prostate Needle Biopsy - 01/30/2020     NON-GU PSH: Surgical Pathology, Gross And Microscopic Examination For Prostate Needle - 01/30/2020     GU PMH: Prostate Cancer - 02/07/2020 Elevated PSA - 01/30/2020, Discussed possible reasons for elevated PSA including recent infection, trauma,  inflammation, indwelling catheter, enlarged prostate and prostate cancer. As patient had recent indwelling Foley catheter would like to repeat his PSA. If it is declining will likely follow these numbers. If PSA is elevated then I would like to proceed with a prostate biopsy. As he is on Eliquis I will need to discuss with his primary care doctor the ability to hold this prior to the biopsy. I discussed risks and benefits of prostate biospy including blood in the urine/stool/semen, pain, and infection risk. I will call patient with results of PSA that is been repeated today., - 12/08/2019    NON-GU PMH: None   FAMILY HISTORY: None   SOCIAL HISTORY: Marital Status: Married Preferred Language: English; Ethnicity: Not Hispanic Or Latino; Race: Black or African American Current Smoking Status: Patient does not smoke anymore. Has not smoked since 11/25/1994.   Tobacco Use Assessment Completed: Used Tobacco in last 30 days? Has never drank.  Does not drink caffeine.    REVIEW OF SYSTEMS:    GU Review Male:   Patient denies frequent urination, hard to postpone urination, burning/ pain with urination, get up at night to urinate, leakage of urine, stream starts and stops, trouble starting your stream, have to strain to urinate , erection problems, and penile pain.  Gastrointestinal (Upper):   Patient denies nausea, vomiting, and indigestion/ heartburn.  Gastrointestinal (Lower):   Patient denies diarrhea and  constipation.  Constitutional:   Patient denies fever, night sweats, weight loss, and fatigue.  Skin:   Patient denies skin rash/ lesion and itching.  Eyes:   Patient denies blurred vision and double vision.  Ears/ Nose/ Throat:   Patient denies sore throat and sinus problems.  Hematologic/Lymphatic:   Patient denies swollen glands and easy bruising.  Cardiovascular:   Patient denies leg swelling and chest pains.  Respiratory:   Patient denies cough and shortness of breath.  Endocrine:   Patient  denies excessive thirst.  Musculoskeletal:   Patient denies back pain and joint pain.  Neurological:   Patient denies headaches and dizziness.  Psychologic:   Patient denies depression and anxiety.   VITAL SIGNS:      03/13/2020 03:51 PM  Weight 195 lb / 88.45 kg  Height 68 in / 172.72 cm  BP 147/98 mmHg  Pulse 69 /min  Temperature 98.2 F / 36.7 C  BMI 29.6 kg/m   MULTI-SYSTEM PHYSICAL EXAMINATION:    Constitutional: Well-nourished. No physical deformities. Normally developed. Good grooming.  Respiratory: No labored breathing, no use of accessory muscles.   Cardiovascular: Normal temperature, normal extremity pulses, no swelling, no varicosities.  Gastrointestinal: No mass, no tenderness, no rigidity, non obese abdomen.     Complexity of Data:  Source Of History:  Patient, Medical Record Summary  Lab Test Review:   PSA  Records Review:   Pathology Reports, Previous Doctor Records, Previous Patient Records  Urine Test Review:   Urinalysis  X-Ray Review: C.T. Abdomen/Pelvis: Reviewed Films. Reviewed Report.  MRI Abdomen: Reviewed Films. Reviewed Report.     12/08/19  PSA  Total PSA 10.00 ng/mL    PROCEDURES:          Urinalysis Dipstick Dipstick Cont'd  Color: Yellow Bilirubin: Neg mg/dL  Appearance: Clear Ketones: Neg mg/dL  Specific Gravity: 1.020 Blood: Neg ery/uL  pH: 6.0 Protein: Neg mg/dL  Glucose: Neg mg/dL Urobilinogen: 0.2 mg/dL    Nitrites: Neg    Leukocyte Esterase: Neg leu/uL    ASSESSMENT:      ICD-10 Details  1 GU:   Prostate Cancer - C61    PLAN:           Schedule Return Visit/Planned Activity: 1 Month - Office Visit          Document Letter(s):  Created for Patient: Clinical Summary         Notes:   1. Newly diagnosed cT1aNxMx GS 3+4 = 7(Dx 01/30/2020), adenocarcinoma the prostate with 3/12 total cores positive, prostate volume of 53 cm3, prebiopsy PSA = 11.19 ng/mL  -Reviewed pathology report in detail with patient today. Explained that  the patient has intermediate favorable risk disease based on his pathology report.  -Reviewed MSK nomogram as above  -Discussed treatment options for localized prostate cancer to include RALP vs XRT +/- ADT  -Reviewed CT A/P and MRI abd that were obtained in 11/2019 due to occlusion of TIPS.  -If elects for RALP with b/l PLND, will need preoperative clearance to hold warfarin  -Also discussed 57mm right pulmonary nodule that per report, reccomend surveillance in 6 months.  -Referral to radiation oncology for evaluation of radiation options  -All questions answered today  -RTC in 4 weeks to discuss treatment decision   Discussion:  Using NCCN risk stratification criteria, his disease is considered favorable intermediate risk. We discussed his diagnosis in detail, reviewing the significance of gleason score, PSA, DRE, and percentage of cores positive. We discussed the various  management options for prostate cancer, including active surveillance, open and robotic radical prostatectomy, EBRT, brachytherapy, proton therapy, and cryotherapy. We discussed the generally indolent course of many prostate cancers, and the generally favorable oncologic control offered by all treatment strategies. However, I emphasized that all treatments offer only potential for cure and that in many cases multimodal therapy may ultimately be utilized for long term cancer control. We also discussed the impact of treatment on sexual and urinary function. I emphasized that each treatment has unique quality of life impact and recovery profiles, and we reviewed the possible effects of surgery, radiation, and cryotherapy on quality of life outcomes. All questions were answered.

## 2020-05-31 NOTE — Anesthesia Preprocedure Evaluation (Signed)
Anesthesia Evaluation  Patient identified by MRN, date of birth, ID band Patient awake    Reviewed: Allergy & Precautions, NPO status , Patient's Chart, lab work & pertinent test results  Airway Mallampati: II  TM Distance: >3 FB Neck ROM: Full    Dental no notable dental hx.    Pulmonary neg pulmonary ROS, former smoker,    Pulmonary exam normal breath sounds clear to auscultation       Cardiovascular hypertension, + CAD  Normal cardiovascular exam Rhythm:Regular Rate:Normal     Neuro/Psych negative neurological ROS  negative psych ROS   GI/Hepatic negative GI ROS, Neg liver ROS,   Endo/Other  negative endocrine ROS  Renal/GU negative Renal ROS  negative genitourinary   Musculoskeletal negative musculoskeletal ROS (+)   Abdominal   Peds negative pediatric ROS (+)  Hematology Antiphospholipid antibody syndrome ([place]   Anesthesia Other Findings   Reproductive/Obstetrics negative OB ROS                             Anesthesia Physical Anesthesia Plan  ASA: III  Anesthesia Plan: General   Post-op Pain Management:    Induction: Intravenous  PONV Risk Score and Plan: 2 and Ondansetron, Dexamethasone and Treatment may vary due to age or medical condition  Airway Management Planned: LMA  Additional Equipment:   Intra-op Plan:   Post-operative Plan: Extubation in OR  Informed Consent: I have reviewed the patients History and Physical, chart, labs and discussed the procedure including the risks, benefits and alternatives for the proposed anesthesia with the patient or authorized representative who has indicated his/her understanding and acceptance.     Dental advisory given  Plan Discussed with: CRNA and Surgeon  Anesthesia Plan Comments:         Anesthesia Quick Evaluation

## 2020-05-31 NOTE — Transfer of Care (Signed)
Immediate Anesthesia Transfer of Care Note  Patient: Harry Lucero  Procedure(s) Performed: RADIOACTIVE SEED IMPLANT/BRACHYTHERAPY IMPLANT (N/A Prostate) CYSTOSCOPY FLEXIBLE (N/A Bladder)  Patient Location: PACU  Anesthesia Type:General  Level of Consciousness: awake and patient cooperative  Airway & Oxygen Therapy: Patient Spontanous Breathing and Patient connected to nasal cannula oxygen  Post-op Assessment: Report given to RN and Post -op Vital signs reviewed and stable  Post vital signs: Reviewed and stable  Last Vitals:  Vitals Value Taken Time  BP    Temp    Pulse    Resp    SpO2      Last Pain:  Vitals:   05/31/20 1136  TempSrc: Oral  PainSc: 0-No pain      Patients Stated Pain Goal: 3 (34/03/70 9643)  Complications: No complications documented.

## 2020-05-31 NOTE — Anesthesia Procedure Notes (Signed)
Procedure Name: Intubation Date/Time: 05/31/2020 1:31 PM Performed by: Georgeanne Nim, CRNA Pre-anesthesia Checklist: Patient identified, Emergency Drugs available, Suction available, Patient being monitored and Timeout performed Patient Re-evaluated:Patient Re-evaluated prior to induction Oxygen Delivery Method: Circle system utilized Preoxygenation: Pre-oxygenation with 100% oxygen Induction Type: IV induction Ventilation: Mask ventilation without difficulty Laryngoscope Size: Mac and 4 Grade View: Grade I Tube type: Oral Tube size: 7.5 mm Number of attempts: 1 Airway Equipment and Method: Stylet Placement Confirmation: ETT inserted through vocal cords under direct vision,  positive ETCO2,  CO2 detector and breath sounds checked- equal and bilateral Secured at: 22 cm Dental Injury: Teeth and Oropharynx as per pre-operative assessment

## 2020-05-31 NOTE — Discharge Instructions (Signed)
DISCHARGE INSTRUCTIONS FOR PROSTATE SEED IMPLANTATION   Antibiotics You may be given a prescription for an antibiotic to take when you arrive home. If so, be sure to take every tablet in the bottle, even if you are feeling better before the prescription is finished. If you begin itching, notice a rash or start to swell on your trunk, arms, legs and/or throat, immediately stop taking the antibiotic and call your Urologist. Diet Resume your usual diet when you return home. To keep your bowels moving easily and softly, drink prune, apple and cranberry juice at room temperature. You may also take a stool softener, such as Colace, which is available without prescription at local pharmacies. Daily activities No driving or heavy lifting for at least two days after the implant. No bike riding, horseback riding or riding lawn mowers for the first month after the implant. Any strenuous physical activity should be approved by your doctor before you resume it. Sexual relations You may resume sexual relations two weeks after the procedure. A condom should be used for the first two weeks. Your semen may be dark brown or black; this is normal and is related bleeding that may have occurred during the implant. Postoperative swelling Expect swelling and bruising of the scrotum and perineum (the area between the scrotum and anus). Both the swelling and the bruising should resolve in l or 2 weeks. Ice packs and over- the-counter medications such as Tylenol, Advil or Aleve may lessen your discomfort. Postoperative urination Most men experience burning on urination and/or urinary frequency. If this becomes bothersome, contact your Urologist.  Medication can be prescribed to relieve these problems.  It is normal to have some blood in your urine for a few days after the implant. Special instructions related to the seeds It is unlikely that you will pass an Iodine-125 seed in your urine. The seeds are silver in color and  are about as large as a grain of rice. If you pass a seed, do not handle it with your fingers. Use a spoon to place it in an envelope or jar in place this in base occluded area such as the garage or basement for return to the radiation clinic at your convenience.  Contact your doctor for Temperature greater than 101 F Increasing pain Inability to urinate Follow-up  You should have follow up with your urologist and radiation oncologist about 3 weeks after the procedure. General information regarding Iodine seeds Iodine-125 is a low energy radioactive material. It is not deeply penetrating and loses energy at short distances. Your prostate will absorb the radiation. Objects that are touched or used by the patient do not become radioactive. Body wastes (urine and stool) or body fluids (saliva, tears, semen or blood) are not radioactive. The Nuclear Regulatory Commission (NRC) has determined that no radiation precautions are needed for patients undergoing Iodine-125 seed implantation. The NRC states that such patients do not present a risk to the people around them, including young children and pregnant women. However, in keeping with the general principle that radiation exposure should be kept as low reasonably possible, we suggest the following: Children and pets should not sit on the patient's lap for the first two (2) weeks after the implant. Pregnant (or possibly pregnant) women should avoid prolonged, close contact with the patient for the first two (2) weeks after the implant. A distance of three (3) feet is acceptable. At a distance of three (3) feet, there is no limit to the length of time anyone can   with the patient.     Post Anesthesia Home Care Instructions  Activity: Get plenty of rest for the remainder of the day. A responsible individual must stay with you for 24 hours following the procedure.  For the next 24 hours, DO NOT: -Drive a car -Operate machinery -Drink alcoholic  beverages -Take any medication unless instructed by your physician -Make any legal decisions or sign important papers.  Meals: Start with liquid foods such as gelatin or soup. Progress to regular foods as tolerated. Avoid greasy, spicy, heavy foods. If nausea and/or vomiting occur, drink only clear liquids until the nausea and/or vomiting subsides. Call your physician if vomiting continues.  Special Instructions/Symptoms: Your throat may feel dry or sore from the anesthesia or the breathing tube placed in your throat during surgery. If this causes discomfort, gargle with warm salt water. The discomfort should disappear within 24 hours.      

## 2020-05-31 NOTE — Interval H&P Note (Signed)
History and Physical Interval Note: Discussed with patient proceeding with brachytherapy seed implantation.  SpaceOAR was not approved by his insurance company.   05/31/2020 12:46 PM  Harry Lucero  has presented today for surgery, with the diagnosis of PROSTATE CANCER.  The various methods of treatment have been discussed with the patient and family. After consideration of risks, benefits and other options for treatment, the patient has consented to  Procedure(s) with comments: RADIOACTIVE SEED IMPLANT/BRACHYTHERAPY IMPLANT (N/A) - SEEDS IMPLANTED CYSTOSCOPY FLEXIBLE (N/A) - NO SEEDS FOUND BY DR. Jshaun Abernathy as a surgical intervention.  The patient's history has been reviewed, patient examined, no change in status, stable for surgery.  I have reviewed the patient's chart and labs.  Questions were answered to the patient's satisfaction.     [name] D [name]

## 2020-05-31 NOTE — Anesthesia Postprocedure Evaluation (Signed)
Anesthesia Post Note  Patient: Harry Lucero  Procedure(s) Performed: RADIOACTIVE SEED IMPLANT/BRACHYTHERAPY IMPLANT (N/A Prostate) CYSTOSCOPY FLEXIBLE (N/A Bladder)     Patient location during evaluation: PACU Anesthesia Type: General Level of consciousness: awake and alert Pain management: pain level controlled Vital Signs Assessment: post-procedure vital signs reviewed and stable Respiratory status: spontaneous breathing, nonlabored ventilation, respiratory function stable and patient connected to nasal cannula oxygen Cardiovascular status: blood pressure returned to baseline and stable Postop Assessment: no apparent nausea or vomiting Anesthetic complications: no   No complications documented.  Last Vitals:  Vitals:   05/31/20 1136 05/31/20 1453  BP: (!) 132/93 (!) 138/98  Pulse: 66 85  Resp: 15 16  Temp: (!) 36.4 C 36.4 C  SpO2: 97% 99%    Last Pain:  Vitals:   05/31/20 1453  TempSrc:   PainSc: 0-No pain                 [name]

## 2020-05-31 NOTE — Op Note (Signed)
PATIENT:  Harry Lucero  PRE-OPERATIVE DIAGNOSIS:  Adenocarcinoma of the prostate  POST-OPERATIVE DIAGNOSIS:  Same  PROCEDURE:  1. I-125 radioactive seed implantation 2. Cystoscopy  4. Fluoroscopy use with time less than 1 hour.  SURGEON:  Dr. Jacalyn Lefevre  Radiation oncologist: Dr. Tyler Pita  ANESTHESIA:  General  EBL:  Minimal  DRAINS: None  INDICATION: Harry Lucero  Description of procedure: After informed consent the patient was brought to the major OR, placed on the table and administered general anesthesia. He was then moved to the modified lithotomy position with his perineum perpendicular to the floor. His perineum and genitalia were then sterilely prepped. An official timeout was then performed. A 16 French Foley catheter was then placed in the bladder and filled with dilute contrast, a rectal tube was placed in the rectum and the transrectal ultrasound probe was placed in the rectum and affixed to the stand. He was then sterilely draped.  Real time ultrasonography was used along with the seed planning software On[employer] Prostate vs. 4.2.2.4. This was used to develop the seed plan including the number of needles as well as number of seeds required for complete and adequate coverage.  The needles were then preloaded with seeds and spacers according to the previously developed plan.  Real-time ultrasonography was then used along with the previously developed plan to implant a total of 65 seeds using 19 needles. This proceeded without difficulty or complication.    A Foley catheter was then removed as well as the transrectal ultrasound probe and rectal probe. Flexible cystoscopy was then performed using the 17 French flexible scope which revealed a normal urethra throughout its length down to the sphincter which appeared intact. The prostatic urethra revealed bilobar hypertrophy but no evidence of obstruction, seeds, spacers or lesions. The bladder was then entered and fully  and systematically inspected. The ureteral orifices were noted to be of normal configuration and position. The mucosa revealed no evidence of tumors. There were also no stones identified within the bladder. I noted no seeds or spacers on the floor of the bladder and retroflexion of the scope revealed no seeds protruding from the base of the prostate.  C-arm fluoroscopy was then used to evaluate the distribution of seeds placement and aid in determining confirmation of the number of seeds placed.  Real-time fluoroscopy was used with saved images revealing the location of the seeds placed both in AP and oblique views.  The cystoscope was then removed and the patient was awakened and taken to recovery room in stable and satisfactory condition. He tolerated procedure well and there were no intraoperative complications.

## 2020-06-01 ENCOUNTER — Ambulatory Visit (HOSPITAL_BASED_OUTPATIENT_CLINIC_OR_DEPARTMENT_OTHER): Admit: 2020-06-01 | Payer: BC Managed Care – PPO | Admitting: Urology

## 2020-06-01 ENCOUNTER — Encounter (HOSPITAL_BASED_OUTPATIENT_CLINIC_OR_DEPARTMENT_OTHER): Payer: Self-pay | Admitting: Urology

## 2020-06-01 ENCOUNTER — Encounter (HOSPITAL_BASED_OUTPATIENT_CLINIC_OR_DEPARTMENT_OTHER): Payer: Self-pay

## 2020-06-01 SURGERY — INSERTION, RADIATION SOURCE, PROSTATE
Anesthesia: General

## 2020-06-04 NOTE — Progress Notes (Signed)
  Radiation Oncology         [phone] ________________________________  Name: Harry Lucero MRN: [id number]  Date: 06/04/2020  DOB: [birth year]       Prostate Seed Implant  CC:Simona Huh, NP  No ref. provider found  DIAGNOSIS: 65 y.o. gentleman with Stage T1c adenocarcinoma of the prostate with Gleason score of 3+4, and PSA of 10. Oncology History  Malignant neoplasm of prostate ([place])  01/30/2020 Cancer Staging   Staging form: Prostate, AJCC 8th Edition - Clinical stage from 01/30/2020: Stage IIB (cT1c, cN0, cM0, PSA: 10, Grade Group: 2) - Signed by Freeman Caldron, PA-C on 03/27/2020 Histopathologic type: Adenocarcinoma, NOS Stage prefix: Initial diagnosis Prostate specific antigen (PSA) range: 10 to 19 Gleason primary pattern: 3 Gleason secondary pattern: 4 Gleason score: 7 Histologic grading system: 5 grade system Number of biopsy cores examined: 12 Number of biopsy cores positive: 3 Location of positive needle core biopsies: Both sides   03/27/2020 Initial Diagnosis   Malignant neoplasm of prostate ([facility])    PROCEDURE: Insertion of radioactive I-125 seeds into the prostate gland.  RADIATION DOSE: 145 Gy, definitive therapy.  TECHNIQUE: Harry Lucero was brought to the operating room with the urologist. He was placed in the dorsolithotomy position. He was catheterized and a rectal tube was inserted. The perineum was shaved, prepped and draped. The ultrasound probe was then introduced into the rectum to see the prostate gland.  TREATMENT DEVICE: A needle grid was attached to the ultrasound probe stand and anchor needles were placed.  3D PLANNING: The prostate was imaged in 3D using a sagittal sweep of the prostate probe. These images were transferred to the planning computer. There, the prostate, urethra and rectum were defined on each axial reconstructed image. Then, the software created an optimized 3D plan and a few seed positions were adjusted. The quality of the plan was  reviewed using [facility] information for the target and the following two organs at risk:  Urethra and Rectum.  Then the accepted plan was printed and handed off to the radiation therapist.  Under my supervision, the custom loading of the seeds and spacers was carried out and loaded into sealed vicryl sleeves.  These pre-loaded needles were then placed into the needle holder.Marland Kitchen  PROSTATE VOLUME STUDY:  Using transrectal ultrasound the volume of the prostate was verified to be 43 cc.  SPECIAL TREATMENT PROCEDURE/SUPERVISION AND HANDLING: The pre-loaded needles were then delivered under sagittal guidance. A total of 19 needles were used to deposit 65 seeds in the prostate gland. The individual seed activity was 0.531 mCi.  SpaceOAR:  No  COMPLEX SIMULATION: At the end of the procedure, an anterior radiograph of the pelvis was obtained to document seed positioning and count. Cystoscopy was performed to check the urethra and bladder.  MICRODOSIMETRY: At the end of the procedure, the patient was emitting 0.059 mR/hr at 1 meter. Accordingly, he was considered safe for hospital discharge.  PLAN: The patient will return to the radiation oncology clinic for post implant CT dosimetry in three weeks.   ________________________________  Sheral Apley Tammi Klippel, M.D.

## 2020-06-11 ENCOUNTER — Telehealth: Payer: Self-pay | Admitting: Hematology and Oncology

## 2020-06-11 NOTE — Telephone Encounter (Signed)
R/s appt per 4/18 sch msg. Pt aware.  

## 2020-06-14 ENCOUNTER — Other Ambulatory Visit: Payer: Self-pay

## 2020-06-14 ENCOUNTER — Inpatient Hospital Stay: Payer: BC Managed Care – PPO | Attending: Hematology and Oncology

## 2020-06-14 DIAGNOSIS — D6861 Antiphospholipid syndrome: Secondary | ICD-10-CM | POA: Diagnosis not present

## 2020-06-14 LAB — PROTIME-INR
INR: 2.1 — ABNORMAL HIGH (ref 0.8–1.2)
Prothrombin Time: 23.3 seconds — ABNORMAL HIGH (ref 11.4–15.2)

## 2020-06-15 ENCOUNTER — Inpatient Hospital Stay: Payer: BC Managed Care – PPO

## 2020-06-25 ENCOUNTER — Telehealth: Payer: Self-pay | Admitting: *Deleted

## 2020-06-25 NOTE — Telephone Encounter (Signed)
CALLED PATIENT TO REMIND OF POST SEED APPTS. FOR 06-26-20, SPOKE WITH PATIENT AND HE IS AWARE OF THESE APPTS.

## 2020-06-26 ENCOUNTER — Ambulatory Visit
Admission: RE | Admit: 2020-06-26 | Discharge: 2020-06-26 | Disposition: A | Discharge status: Home / Self Care | Payer: BC Managed Care – PPO | Source: Ambulatory Visit | Attending: Urology | Admitting: Urology

## 2020-06-26 ENCOUNTER — Telehealth: Payer: Self-pay | Admitting: *Deleted

## 2020-06-26 ENCOUNTER — Ambulatory Visit
Admission: RE | Admit: 2020-06-26 | Discharge: 2020-06-26 | Disposition: A | Discharge status: Home / Self Care | Payer: BC Managed Care – PPO | Source: Ambulatory Visit | Attending: Radiation Oncology | Admitting: Radiation Oncology

## 2020-06-26 ENCOUNTER — Other Ambulatory Visit: Payer: Self-pay

## 2020-06-26 ENCOUNTER — Encounter: Payer: Self-pay | Admitting: Urology

## 2020-06-26 VITALS — BP 118/88 | HR 93 | Temp 97.5°F | Resp 16 | Ht 68.0 in | Wt 200.2 lb

## 2020-06-26 DIAGNOSIS — C61 Malignant neoplasm of prostate: Secondary | ICD-10-CM

## 2020-06-26 NOTE — Telephone Encounter (Signed)
Called patient to ask about coming in today @ 1 pm for sim and 1:30 pm for visit with Harry Lucero, spoke with patient and he is agreeable to do this

## 2020-06-26 NOTE — Progress Notes (Signed)
Radiation Oncology         [phone]) [phone] ________________________________  Name: Harry Lucero MRN: [id number]  Date: 06/26/2020  DOB: Feb 10, 1956  Post-Seed Follow-Up Visit Note  CC: Simona Huh, NP  Robley Fries, MD  Diagnosis:   65 y.o. gentleman with Stage T1c adenocarcinoma of the prostate with Gleason score of 3+4, and PSA of 10.    ICD-10-CM   1. Malignant neoplasm of prostate ([place])  C61     Interval Since Last Radiation:  3.5 weeks 05/31/20:  Insertion of radioactive I-125 seeds into the prostate gland; 145 Gy, definitive therapy with placement of SpaceOAR gel.  Narrative:  The patient returns today for routine follow-up.  He is complaining of increased urinary frequency and urinary hesitation symptoms. He filled out a questionnaire regarding urinary function today providing and overall IPSS score of 11 characterizing his symptoms as moderate with nocturia x 2, weak stream, and increased frequency.  His pre-implant score was 8.  He has noticed some persistent intermittent gross hematuria but denies area, straining to void, urgency, incomplete emptying or incontinence.  He denies any abdominal pain or bowel symptoms.  He has maintained a healthy appetite and is eating well but does report that he has been losing weight, unintentionally.  He denies any fever, chills or night sweats.  He has noticed some mild increased fatigue but nothing that has really slowed him down.  Overall, he is pleased with his progress to date.  ALLERGIES:  is allergic to lisinopril.  Meds: Current Outpatient Medications  Medication Sig Dispense Refill  . Blood Glucose Monitoring Suppl (CONTOUR NEXT EZ) w/Device KIT See admin instructions.    . CONTOUR NEXT TEST test strip daily.    Marland Kitchen glipiZIDE (GLUCOTROL XL) 2.5 MG 24 hr tablet Take 2.5 mg by mouth daily.    . Microlet Lancets MISC daily.    . Multiple Vitamin (MULTIVITAMIN) tablet Take 1 tablet by mouth daily.    . pravastatin (PRAVACHOL) 20 MG tablet  Take 20 mg by mouth daily.    . traMADol (ULTRAM) 50 MG tablet Take 1 tablet (50 mg total) by mouth every 6 (six) hours as needed. 20 tablet 0  . UNABLE TO FIND Vitamin d 50000 units wednesday    . warfarin (COUMADIN) 10 MG tablet Take 1 tablet (10 mg total) by mouth daily. (Patient taking differently: Take 10 mg by mouth daily. Monday to Friday 10 mg) 90 tablet 3  . warfarin (COUMADIN) 5 MG tablet TAKE 2 TABLETS(10 MG) BY MOUTH DAILY (Patient taking differently: Takes 5 mg on saturday and sunday) 60 tablet 3   No current facility-administered medications for this visit.    Physical Findings: In general this is a well appearing African American male in no acute distress. He's alert and oriented x4 and appropriate throughout the examination. Cardiopulmonary assessment is negative for acute distress and he exhibits normal effort.   Lab Findings: Lab Results  Component Value Date   WBC 3.0 (L) 05/28/2020   HGB 12.7 (L) 05/28/2020   HCT 38.9 (L) 05/28/2020   MCV 82.2 05/28/2020   PLT 257 05/28/2020    Radiographic Findings:  Patient underwent CT imaging in our clinic for post implant dosimetry. The CT will be reviewed by Dr. Tammi Klippel to confirm there is an adequate distribution of radioactive seeds throughout the prostate gland and ensure that there are no seeds in or near the rectum. We suspect the final radiation plan and dosimetry will show appropriate coverage of the prostate  gland. He understands that we will call and inform him of any unexpected findings on further review of his imaging and dosimetry.  Impression/Plan: 65 y.o. gentleman with Stage T1c adenocarcinoma of the prostate with Gleason score of 3+4, and PSA of 10. The patient is recovering from the effects of radiation. His urinary symptoms should gradually improve over the next 4-6 months. We talked about this today. He is encouraged by his improvement already and is otherwise pleased with his outcome.  Regarding the  unintentional weight loss, I have advised him to continue to monitor this closely and inform his if he should this continue.  We also talked about long-term follow-up for prostate cancer following seed implant. He understands that ongoing PSA determinations and digital rectal exams will help perform surveillance to rule out disease recurrence. He was Jiles Crocker, NP 06/14/20 and has a follow up appointment scheduled for labs 09/06/20 at 2:15pm and will see Dr. Claudia Desanctis the following week. He understands what to expect with his PSA measures. Patient was also educated today about some of the long-term effects from radiation including a small risk for rectal bleeding and possibly erectile dysfunction. We talked about some of the general management approaches to these potential complications. However, I did encourage the patient to contact our office or return at any point if he has questions or concerns related to his previous radiation and prostate cancer.    Nicholos Johns, PA-C

## 2020-06-26 NOTE — Progress Notes (Signed)
  Radiation Oncology         [phone] ________________________________  Name: Harry Lucero MRN: [id number]  Date: 06/26/2020  DOB: 1955/07/24  COMPLEX SIMULATION NOTE  NARRATIVE:  The patient was brought to the [facility] suite today following prostate seed implantation approximately one month ago.  Identity was confirmed.  All relevant records and images related to the planned course of therapy were reviewed.  Then, the patient was set-up supine.  CT images were obtained.  The CT images were loaded into the planning software.  Then the prostate and rectum were contoured.  Treatment planning then occurred.  The implanted iodine 125 seeds were identified by the physics staff for projection of radiation distribution  I have requested : 3D Simulation  I have requested a DVH of the following structures: Prostate and rectum.    ________________________________  Sheral Apley Tammi Klippel, M.D.

## 2020-06-26 NOTE — Progress Notes (Signed)
AUA score 11. Seen by urologist two weeks ago does not return for three months.Patient complains of joint and back pain 7/10 he will occasionally take tylenol but for the most part just deals with it, states he does not like to take a lot of medication.

## 2020-07-13 ENCOUNTER — Encounter: Payer: Self-pay | Admitting: Radiation Oncology

## 2020-07-13 ENCOUNTER — Other Ambulatory Visit: Payer: Self-pay

## 2020-07-13 ENCOUNTER — Inpatient Hospital Stay: Payer: BC Managed Care – PPO | Attending: Hematology and Oncology

## 2020-07-13 DIAGNOSIS — Z79899 Other long term (current) drug therapy: Secondary | ICD-10-CM | POA: Insufficient documentation

## 2020-07-13 DIAGNOSIS — C61 Malignant neoplasm of prostate: Secondary | ICD-10-CM | POA: Diagnosis not present

## 2020-07-13 DIAGNOSIS — D6861 Antiphospholipid syndrome: Secondary | ICD-10-CM | POA: Insufficient documentation

## 2020-07-13 LAB — PROTIME-INR
INR: 2.7 — ABNORMAL HIGH (ref 0.8–1.2)
Prothrombin Time: 28.3 seconds — ABNORMAL HIGH (ref 11.4–15.2)

## 2020-07-16 NOTE — Progress Notes (Signed)
  Radiation Oncology         [phone]) [phone] ________________________________  Name: Harry Lucero MRN: [id number]  Date: 07/13/2020  DOB: 31-Oct-1955  3D Planning Note   Prostate Brachytherapy Post-Implant Dosimetry  Diagnosis: 65 y.o. gentleman with Stage T1c adenocarcinoma of the prostate with Gleason score of 3+4, and PSA of 10.  Narrative: On a previous date, Harry Lucero returned following prostate seed implantation for post implant planning. He underwent CT scan complex simulation to delineate the three-dimensional structures of the pelvis and demonstrate the radiation distribution.  Since that time, the seed localization, and complex isodose planning with dose volume histograms have now been completed.  Results:   Prostate Coverage - The dose of radiation delivered to the 90% or more of the prostate gland (D90) was 114.42% of the prescription dose. This exceeds our goal of greater than 90%. Rectal Sparing - The volume of rectal tissue receiving the prescription dose or higher was 0.05 cc. This falls under our thresholds tolerance of 1.0 cc.  Impression: The prostate seed implant appears to show adequate target coverage and appropriate rectal sparing.  Plan:  The patient will continue to follow with urology for ongoing PSA determinations. I would anticipate a high likelihood for local tumor control with minimal risk for rectal morbidity.  ________________________________  Sheral Apley Tammi Klippel, M.D.

## 2020-08-17 ENCOUNTER — Inpatient Hospital Stay: Payer: BC Managed Care – PPO | Attending: Hematology and Oncology

## 2020-08-17 ENCOUNTER — Other Ambulatory Visit: Payer: Self-pay

## 2020-08-17 DIAGNOSIS — D6861 Antiphospholipid syndrome: Secondary | ICD-10-CM | POA: Insufficient documentation

## 2020-08-17 LAB — PROTIME-INR
INR: 2.8 — ABNORMAL HIGH (ref 0.8–1.2)
Prothrombin Time: 29.5 seconds — ABNORMAL HIGH (ref 11.4–15.2)

## 2020-08-21 ENCOUNTER — Emergency Department (HOSPITAL_COMMUNITY): Payer: BC Managed Care – PPO

## 2020-08-21 ENCOUNTER — Encounter (HOSPITAL_COMMUNITY): Payer: Self-pay | Admitting: Emergency Medicine

## 2020-08-21 ENCOUNTER — Emergency Department (HOSPITAL_COMMUNITY)
Admission: EM | Admit: 2020-08-21 | Discharge: 2020-08-21 | Disposition: A | Discharge status: Home / Self Care | Payer: BC Managed Care – PPO | Attending: Emergency Medicine | Admitting: Emergency Medicine

## 2020-08-21 DIAGNOSIS — E119 Type 2 diabetes mellitus without complications: Secondary | ICD-10-CM | POA: Diagnosis not present

## 2020-08-21 DIAGNOSIS — Z8546 Personal history of malignant neoplasm of prostate: Secondary | ICD-10-CM | POA: Insufficient documentation

## 2020-08-21 DIAGNOSIS — Z20822 Contact with and (suspected) exposure to covid-19: Secondary | ICD-10-CM | POA: Diagnosis not present

## 2020-08-21 DIAGNOSIS — Z7984 Long term (current) use of oral hypoglycemic drugs: Secondary | ICD-10-CM | POA: Diagnosis not present

## 2020-08-21 DIAGNOSIS — I10 Essential (primary) hypertension: Secondary | ICD-10-CM | POA: Insufficient documentation

## 2020-08-21 DIAGNOSIS — Z8616 Personal history of COVID-19: Secondary | ICD-10-CM | POA: Diagnosis not present

## 2020-08-21 DIAGNOSIS — I81 Portal vein thrombosis: Secondary | ICD-10-CM | POA: Insufficient documentation

## 2020-08-21 DIAGNOSIS — Z87891 Personal history of nicotine dependence: Secondary | ICD-10-CM | POA: Diagnosis not present

## 2020-08-21 DIAGNOSIS — R079 Chest pain, unspecified: Secondary | ICD-10-CM | POA: Diagnosis present

## 2020-08-21 DIAGNOSIS — Z7901 Long term (current) use of anticoagulants: Secondary | ICD-10-CM | POA: Insufficient documentation

## 2020-08-21 DIAGNOSIS — R0789 Other chest pain: Secondary | ICD-10-CM | POA: Diagnosis not present

## 2020-08-21 DIAGNOSIS — Z79899 Other long term (current) drug therapy: Secondary | ICD-10-CM | POA: Diagnosis not present

## 2020-08-21 LAB — PROTIME-INR
INR: 2.1 — ABNORMAL HIGH (ref 0.8–1.2)
Prothrombin Time: 23.1 seconds — ABNORMAL HIGH (ref 11.4–15.2)

## 2020-08-21 LAB — COMPREHENSIVE METABOLIC PANEL
ALT: 51 U/L — ABNORMAL HIGH (ref 0–44)
AST: 39 U/L (ref 15–41)
Albumin: 3.2 g/dL — ABNORMAL LOW (ref 3.5–5.0)
Alkaline Phosphatase: 59 U/L (ref 38–126)
Anion gap: 4 — ABNORMAL LOW (ref 5–15)
BUN: 15 mg/dL (ref 8–23)
CO2: 27 mmol/L (ref 22–32)
Calcium: 9.6 mg/dL (ref 8.9–10.3)
Chloride: 107 mmol/L (ref 98–111)
Creatinine, Ser: 1.08 mg/dL (ref 0.61–1.24)
GFR, Estimated: >60 mL/min (ref >60)
Glucose, Bld: 92 mg/dL (ref 70–99)
Potassium: 4.3 mmol/L (ref 3.5–5.1)
Sodium: 138 mmol/L (ref 135–145)
Total Bilirubin: 1 mg/dL (ref 0.3–1.2)
Total Protein: 6.4 g/dL — ABNORMAL LOW (ref 6.5–8.1)

## 2020-08-21 LAB — CBC WITH DIFFERENTIAL/PLATELET
Abs Immature Granulocytes: 0.01 10*3/uL (ref 0.00–0.07)
Basophils Absolute: 0 10*3/uL (ref 0.0–0.1)
Basophils Relative: 1 %
Eosinophils Absolute: 0.1 10*3/uL (ref 0.0–0.5)
Eosinophils Relative: 1 %
HCT: 37.6 % — ABNORMAL LOW (ref 39.0–52.0)
Hemoglobin: 12 g/dL — ABNORMAL LOW (ref 13.0–17.0)
Immature Granulocytes: 0 %
Lymphocytes Relative: 36 %
Lymphs Abs: 1.3 10*3/uL (ref 0.7–4.0)
MCH: 26.4 pg (ref 26.0–34.0)
MCHC: 31.9 g/dL (ref 30.0–36.0)
MCV: 82.8 fL (ref 80.0–100.0)
Monocytes Absolute: 0.5 10*3/uL (ref 0.1–1.0)
Monocytes Relative: 15 %
Neutro Abs: 1.6 10*3/uL — ABNORMAL LOW (ref 1.7–7.7)
Neutrophils Relative %: 47 %
Platelets: 229 10*3/uL (ref 150–400)
RBC: 4.54 MIL/uL (ref 4.22–5.81)
RDW: 15.2 % (ref 11.5–15.5)
WBC: 3.5 10*3/uL — ABNORMAL LOW (ref 4.0–10.5)
nRBC: 0 % (ref 0.0–0.2)

## 2020-08-21 LAB — URINALYSIS, ROUTINE W REFLEX MICROSCOPIC
Bilirubin Urine: NEGATIVE
Glucose, UA: NEGATIVE mg/dL
Hgb urine dipstick: NEGATIVE
Ketones, ur: NEGATIVE mg/dL
Leukocytes,Ua: NEGATIVE
Nitrite: NEGATIVE
Protein, ur: NEGATIVE mg/dL
Specific Gravity, Urine: 1.019 (ref 1.005–1.030)
pH: 5 (ref 5.0–8.0)

## 2020-08-21 LAB — RESP PANEL BY RT-PCR (FLU A&B, COVID) ARPGX2
Influenza A by PCR: NEGATIVE
Influenza B by PCR: NEGATIVE
SARS Coronavirus 2 by RT PCR: NEGATIVE

## 2020-08-21 LAB — TROPONIN I (HIGH SENSITIVITY)
Troponin I (High Sensitivity): 3 ng/L (ref <18)
Troponin I (High Sensitivity): 2 ng/L (ref <18)

## 2020-08-21 LAB — LIPASE, BLOOD: Lipase: 56 U/L — ABNORMAL HIGH (ref 11–51)

## 2020-08-21 MED ORDER — IOHEXOL 350 MG/ML SOLN
100.0000 mL | Freq: Once | INTRAVENOUS | Status: AC | PRN
  Administered 2020-08-21: 100 mL via INTRAVENOUS

## 2020-08-21 MED ORDER — MORPHINE SULFATE (PF) 2 MG/ML IV SOLN
2.0000 mg | Freq: Once | INTRAVENOUS | Status: AC
Start: 2020-08-21 — End: 2020-08-21
  Administered 2020-08-21: 2 mg via INTRAVENOUS
  Filled 2020-08-21: qty 1

## 2020-08-21 MED ORDER — ALUM & MAG HYDROXIDE-SIMETH 200-200-20 MG/5ML PO SUSP
30.0000 mL | Freq: Once | ORAL | Status: AC
  Administered 2020-08-21: 30 mL via ORAL
  Filled 2020-08-21: qty 30

## 2020-08-21 NOTE — ED Notes (Signed)
Taken to CT at this time by CT tech.

## 2020-08-21 NOTE — ED Provider Notes (Signed)
Emergency Medicine Provider Triage Evaluation Note  Quay Simkin , a 65 y.o. male  was evaluated in triage.  Pt complains of epigastric abd pain/lower chest pain. States sxs similar to when he was dx with portal vein thrombosis. On coumadin and states he has been compliant.  Review of Systems  Positive: Epigastric pain/chest pain Negative: sob  Physical Exam  There were no vitals taken for this visit. Gen:   Awake, no distress   Resp:  Normal effort  MSK:   Moves extremities without difficulty  Other:  No abd ttp, heart rrr, lungs ctab  Medical Decision Making  Medically screening exam initiated at 1:58 PM.  Appropriate orders placed.  Traquan Duarte was informed that the remainder of the evaluation will be completed by another provider, this initial triage assessment does not replace that evaluation, and the importance of remaining in the ED until their evaluation is complete.     Bishop Dublin 08/21/20 1359    Blanchie Dessert, MD 08/23/20 1226

## 2020-08-21 NOTE — ED Triage Notes (Signed)
Pt to triage via [facility] from home.  Reports epigastric/chest pain since yesterday around 3pm.  Pain worse today.  20g LAC.  History of portal vein clots.  Takes Coumadin. Denies SOB, nausea, and vomiting.

## 2020-08-21 NOTE — ED Provider Notes (Signed)
[facility] EMERGENCY DEPARTMENT Provider Note   CSN: [id number] Arrival date & time: 08/21/20  1344     History Chief Complaint  Patient presents with   Chest Pain     Torien Ramroop is a 65 y.o. male with past medical history significant for antiphospholipid antibody syndrome,, lupus, portal vein thrombosis, prostate cancer.  Anticoagulated on Coumadin.  HPI Patient presents to emergency room today via EMS with chief complaint of chest pain x 1 day.  Patient states the pain started yesterday afternoon while he was at work.  He states he was sitting in a truck when the pain started.  He had not been lifting anything heavy or even driving during the day.  He was just along to assist his coworker.  He states the pain was intermittent when it first started.  The pain is located in the middle of his chest and felt like a squeezing.  When pain first started he rated it 10 out of 10 in severity.  Patient states it felt similar to when he was found to have portal vein clot in August 2021 at hospital in [place] when he was traveling.  He states the pain did not radiate.  When he woke up this morning he noticed that the pain had become constant.  He was in the same location and he rates his severity 5 out of 10 in severity.  Patient states the pain is worse when changing positions such as going from sitting to standing or bending over to tie his shoes.  Pain is not worse with exertion.  He called out of work and came to the ER for further evaluation.  He not take any over-the-counter medication for symptoms prior to arrival.  He reports compliance with his home medications including Coumadin.  He denies any fever, chills, diaphoresis, shortness of breath, hemoptysis, hematemesis, nausea, emesis, back pain, palpitations, syncope, leg swelling, urinary symptoms, diarrhea, black tarry stool.     Past Medical History:  Diagnosis Date   Antiphospholipid antibody syndrome (HCC)     Cough due to ACE inhibitor    clear mucous due to lisinopril   COVID 01/06-2020   asymptomatic   Headache    Hypertension    off lisinopril since 05-08-2020   Lupus anticoagulant positive    Portal vein thrombosis    Pre-diabetes    Prostate cancer [facility])    Wears glasses    for reading    Patient Active Problem List   Diagnosis Date Noted   Malignant neoplasm of prostate ([place]) 03/27/2020   Left upper quadrant pain 02/29/2020   Rib pain 02/29/2020   Diabetes ([place]) 02/22/2020   History of prostate cancer 02/22/2020   Pancreatic cyst 02/22/2020   Antiphospholipid antibody syndrome ([place]) 01/13/2020   VTE (venous thromboembolism) 12/11/2019   Elevated LFTs 12/11/2019   Portal vein thrombosis 12/05/2019   Bacteremia 11/02/2019   S/P percutaneous transluminal angioplasty (PTA) 10/31/2019   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 PACE   IR RADIOLOGIST EVAL & MGMT  01/03/2020   IR RADIOLOGIST EVAL & MGMT  04/18/2020   IR TIPS REVISION MOD SED  01/09/2020   PROSTATE BIOPSY     RADIOACTIVE SEED IMPLANT N/A 05/31/2020   Procedure: RADIOACTIVE SEED IMPLANT/BRACHYTHERAPY IMPLANT;  Surgeon: Robley Fries, MD;  Location: [place];  Service: Urology;  Laterality: N/A;  SEEDS IMPLANTED   RADIOLOGY WITH ANESTHESIA N/A 01/09/2020   Procedure: IR WITH ANESTHESIA TIPS REVISION;  Surgeon: Radiologist, Medication, MD;  Location: [facility];  Service: Radiology;  Laterality: N/A;   TIPS PROCEDURE         Family History  Problem Relation Age of  Onset   Prostate cancer Neg Hx    Colon cancer Neg Hx    Pancreatic cancer Neg Hx    Breast cancer Neg Hx     Social History   Tobacco Use   Smoking status: Former    Packs/day: 1.00    Years: 20.00    Pack years: 20.00    Types: Cigarettes    Quit date: 02/24/1994    Years since quitting: 26.5   Smokeless tobacco: Never  Vaping Use   Vaping Use: Never used  Substance Use Topics   Alcohol use: No   Drug use: No    Comment: hx of marijuana and crack 25 yrs ago last used    Home Medications Prior to Admission medications   Medication Sig Start Date End Date Taking? Authorizing Provider  Blood Glucose Monitoring Suppl (CONTOUR NEXT EZ) w/Device KIT See admin instructions. 03/02/20   [provider]  CONTOUR NEXT TEST test strip daily. 03/01/20   [provider]  glipiZIDE (GLUCOTROL XL) 2.5 MG 24 hr tablet Take 2.5 mg by mouth daily. 02/22/20   [provider]  Microlet Lancets MISC daily. 03/01/20   [provider]  Multiple Vitamin (MULTIVITAMIN) tablet Take 1 tablet by mouth daily.    [provider]  pravastatin (PRAVACHOL) 20 MG tablet Take 20 mg by mouth daily. 02/22/20   [provider]  traMADol (ULTRAM) 50 MG tablet Take 1 tablet (50 mg total) by mouth every 6 (six) hours as needed. 05/31/20 05/31/21  Robley Fries, MD  Vitamin D, Ergocalciferol, (DRISDOL) 1.25 MG (50000 UNIT) CAPS capsule Take 50,000 Units by mouth once a week. 06/10/20   [provider]  warfarin (COUMADIN) 10 MG tablet Take 1 tablet (10 mg total) by mouth daily. Patient taking differently: Take 10 mg by mouth daily. Monday to Friday 10 mg 03/23/20   [name], MD  warfarin (COUMADIN) 5 MG tablet TAKE 2 TABLETS(10 MG) BY MOUTH DAILY Patient taking differently: Takes 5 mg on saturday and sunday 05/16/20   [name], MD  ranitidine (ZANTAC) 150 MG tablet Take 1 tablet (150 mg total) by mouth 2 (two) times daily. Patient not taking: Reported on  12/11/2019 03/19/18 12/11/19  Muthersbaugh, Jarrett Soho, PA-C    Allergies    Lisinopril  Review of Systems   Review of Systems All other systems are reviewed and are negative for acute change except as noted in the HPI.  Physical Exam Updated Vital Signs BP (!) 150/99   Pulse (!) 49   Temp 97.9 F (36.6 C) (Oral)   Resp 19   SpO2 100%   Physical Exam Vitals and nursing note reviewed.  Constitutional:      General: He is not in acute distress.    Appearance: He is not ill-appearing.  HENT:     Head: Normocephalic and atraumatic.     Right Ear: Tympanic membrane and  external ear normal.     Left Ear: Tympanic membrane and external ear normal.     Nose: Nose normal.     Mouth/Throat:     Mouth: Mucous membranes are moist.     Pharynx: Oropharynx is clear.  Eyes:     General: No scleral icterus.       Right eye: No discharge.        Left eye: No discharge.     Extraocular Movements: Extraocular movements intact.     Conjunctiva/sclera: Conjunctivae normal.     Pupils: Pupils are equal, round, and reactive to light.  Neck:     Vascular: No JVD.  Cardiovascular:     Rate and Rhythm: Normal rate and regular rhythm.     Pulses: Normal pulses.          Radial pulses are 2+ on the right side and 2+ on the left side.     Heart sounds: Normal heart sounds.  Pulmonary:     Comments: Lungs clear to auscultation in all fields. Symmetric chest rise. No wheezing, rales, or rhonchi. Chest:     Chest wall: Tenderness present.  Abdominal:     Comments: Abdomen is soft, non-distended, and non-tender in all quadrants. No rigidity, no guarding. No peritoneal signs.  Musculoskeletal:        General: Normal range of motion.     Cervical back: Normal range of motion.     Right lower leg: No edema.     Left lower leg: No edema.  Skin:    General: Skin is warm and dry.     Capillary Refill: Capillary refill takes less than 2 seconds.  Neurological:     Mental Status: He is oriented to  person, place, and time.     GCS: GCS eye subscore is 4. GCS verbal subscore is 5. GCS motor subscore is 6.     Comments: Fluent speech, no facial droop.  Psychiatric:        Behavior: Behavior normal.    ED Results / Procedures / Treatments   Labs (all labs ordered are listed, but only abnormal results are displayed) Labs Reviewed  COMPREHENSIVE METABOLIC PANEL - Abnormal; Notable for the following components:      Result Value   Total Protein 6.4 (*)    Albumin 3.2 (*)    ALT 51 (*)    Anion gap 4 (*)    All other components within normal limits  LIPASE, BLOOD - Abnormal; Notable for the following components:   Lipase 56 (*)    All other components within normal limits  CBC WITH DIFFERENTIAL/PLATELET - Abnormal; Notable for the following components:   WBC 3.5 (*)    Hemoglobin 12.0 (*)    HCT 37.6 (*)    Neutro Abs 1.6 (*)    All other components within normal limits  PROTIME-INR - Abnormal; Notable for the following components:   Prothrombin Time 23.1 (*)    INR 2.1 (*)    All other components within normal limits  URINALYSIS, ROUTINE W REFLEX MICROSCOPIC - Abnormal; Notable for the following components:   APPearance HAZY (*)    All other components within normal limits  RESP PANEL BY RT-PCR (FLU A&B, COVID) [id number]  TROPONIN I (HIGH SENSITIVITY)  TROPONIN I (HIGH SENSITIVITY)    EKG EKG Interpretation  Date/Time:  Tuesday August 21 2020 13:54:00 EDT Ventricular Rate:  66 PR Interval:  142 QRS Duration: 74 QT Interval:  384 QTC Calculation: 402 R  Axis:   -42 Text Interpretation: Normal sinus rhythm Left axis deviation Minimal voltage criteria for LVH, may be normal variant ( R in aVL ) Abnormal ECG No significant change since last tracing Confirmed by Dorie Rank [phone]) on 08/21/2020 2:44:33 PM  Radiology DG Chest 2 View  Result Date: 08/21/2020 CLINICAL DATA:  65 year old male with chest pain. EXAM: CHEST - 2 VIEW COMPARISON:  Chest radiograph dated 05/03/2020.  FINDINGS: No focal consolidation, pleural effusion, or pneumothorax. The cardiac silhouette is within normal limits. No acute osseous pathology. IMPRESSION: No active cardiopulmonary disease. Electronically Signed   By: Anner Crete M.D.   On: 08/21/2020 15:49   US LIVER DOPPLER  Result Date: 08/21/2020 CLINICAL DATA:  65 year old male with known chronic portal thrombus, chronically occluded indwelling tips with cavernous transformation. The patient presents with abdominal pain similar to prior portal vein occlusion. EXAM: DUPLEX ULTRASOUND OF LIVER TECHNIQUE: Color and duplex Doppler ultrasound was performed to evaluate the hepatic in-flow and out-flow vessels. COMPARISON:  04/13/2020 FINDINGS: Liver: Normal parenchymal echogenicity. Normal hepatic contour without nodularity. No focal lesion, mass or intrahepatic biliary ductal dilatation. Main Portal Vein size: Cavernous transformation noted. Portal Vein Velocities Unable to measure due to extensive cavernous transformation. The indwelling tips stent is occluded. Hepatic Vein Velocities Right:  26 cm/sec Middle:  17 cm/sec Left:  24 cm/sec IVC: Present and patent with normal respiratory phasicity. Hepatic Artery Velocity: Not well visualized. 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 Recommend multiphase CTA (BRTO protocol) abdomen and pelvis for further characterization. Ruthann Cancer, MD [facility] Radiology Electronically Signed   By: Ruthann Cancer MD   On: 08/21/2020 15:55   CT Angio Abd/Pel W and/or Wo Contrast  Result Date: 08/21/2020 CLINICAL DATA:  Portal vein thrombosis, BRTO protocol ordered  EXAM: CTA ABDOMEN AND PELVIS WITHOUT AND WITH CONTRAST TECHNIQUE: Multidetector CT imaging of the abdomen and pelvis was performed using the standard protocol during bolus administration of intravenous contrast. Multiplanar reconstructed images and MIPs were obtained and reviewed to evaluate the vascular anatomy. CONTRAST:  146m OMNIPAQUE IOHEXOL 350 MG/ML SOLN COMPARISON:  CT 04/13/2020 FINDINGS: VASCULAR Aorta: Normal caliber aorta without aneurysm, dissection, vasculitis or significant stenosis. Celiac: Patent and normally opacified.  Typical branching pattern. SMA: Patent without evidence of aneurysm, dissection, vasculitis or significant stenosis. Renals: Both renal arteries are patent without evidence of aneurysm, dissection, vasculitis, fibromuscular dysplasia or significant stenosis. IMA: Moderate ostial narrowing at the IMA origin likely secondary to atheromatous plaque. Vessel otherwise normally opacified with normal branching. No acute luminal abnormality. No features of vasculitis. Inflow: Patent without evidence of aneurysm, dissection, vasculitis or significant stenosis. Proximal Outflow: Bilateral common femoral and visualized portions of the superficial and profunda femoral arteries are patent without evidence of aneurysm, dissection, vasculitis or significant stenosis. Veins: Redemonstration of a TIPS which appears occluded on portal venous phase imaging. Additional note made of chronic portal vein occlusion and cavernous transformation at the porta hepatis (8/24). Additional upper abdominal venous collateralization including paraesophageal and gastric collaterals as well as few sites concerning for possible esophageal varices (10/9). No other major venous abnormalities or significant occlusions. Review of the MIP images confirms the above findings. NON-VASCULAR Lower chest: Lung bases are clear. Normal heart size. No pericardial effusion.  Hepatobiliary: Diffuse hepatic hypoattenuation, similar  prior. No focal concerning liver lesion. No visible gallstones within the gallbladder lumen. No pericholecystic fluid or inflammation. Multiple venous collaterals about the gallbladder fossa. No visible intraductal gallstones or biliary ductal dilatation. Pancreas: No pancreatic ductal dilatation or surrounding inflammatory changes. Spleen: Normal in size. No concerning splenic lesions. Adrenals/Urinary Tract: Normal adrenals. Kidneys are normally located with symmetric enhancement across the multiple phase of imaging. No suspicious renal lesion, urolithiasis or hydronephrosis. Mild chronic thickening of the urinary bladder, may be related to prostatomegaly and outlet obstruction. Stomach/Bowel: Paraesophageal and gastric venous collateralization and possible varices, as detailed above. No significant wall thickening or inflammation. No sites of contrast accumulation. Duodenum with a normal sweep across the midline abdomen. No small bowel thickening or dilatation. Normal, noninflamed appendix extending from the cecal tip towards the midline abdomen. Pancolonic diverticulosis without focal inflammation to suggest an active/acute diverticulitis. No other colonic thickening or dilatation is seen either. Lymphatic: No suspicious or enlarged lymph nodes in the included lymphatic chains. Reproductive: Prostatomegaly. There has been interval placement of multiple metallic prostate brachytherapy implants. No acute complication or concerning abnormality the prostate or seminal vesicles is evident on CT imaging. No acute abnormality of the included portions of the external genitalia. Other: No abdominopelvic free fluid or free gas. No bowel containing hernias. Musculoskeletal: No acute osseous abnormality or suspicious osseous lesion. Multilevel degenerative changes are present in the imaged portions of the spine. IMPRESSION: VASCULAR 1. TIPS catheter remains occluded 2. 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 3. Additional upper abdominal venous collateralization is seen about the esophagus, stomach and gallbladder fossa. Suspect esophageal varices as well. 4. No acute aortic abnormality. 5.  Aortic Atherosclerosis (ICD10-I70.0). 6. Moderate ostial narrowing at the IMA origin. NON-VASCULAR 1. Interval placement of multiple metallic prostate brachytherapy implants. No acute complication. 2. Prostatomegaly. Chronic bladder wall thickening likely the result of outlet obstruction though could correlate with urinalysis as warranted. 3. Diffuse hepatic hypoattenuation, similar to prior. Can be seen with fatty infiltration or intrinsic liver disease. Electronically Signed   By: Lovena Le M.D.   On: 08/21/2020 18:25    Procedures Procedures   Medications Ordered in ED Medications  iohexol (OMNIPAQUE) 350 MG/ML injection 100 mL (100 mLs Intravenous Contrast Given 08/21/20 1753)  morphine 2 MG/ML injection 2 mg (2 mg Intravenous Given 08/21/20 1850)  alum & mag hydroxide-simeth (MAALOX/MYLANTA) 200-200-20 MG/5ML suspension 30 mL (30 mLs Oral Given 08/21/20 1851)    ED Course  I have reviewed the triage vital signs and the nursing notes.  Pertinent labs & imaging results that were available during my care of the patient were reviewed by me and considered in my medical decision making (see chart for details).     MDM Rules/Calculators/A&P                          History provided by patient with additional history obtained from chart review.    Presenting with chest pain x1 day with concerning history of portal vein thrombosis.  Patient afebrile, hemodynamically stable.  Work-up was initiated in triage with basic labs including troponin and lipase as well as INR.  Chest x-ray was obtained and shows no acute infectious processes.  I viewed with imaging and agree with radiologist impression.  Liver ultrasound was ordered as well and had overall limited evaluation of portal  vein secondary to chronic portal and TIPS thrombosis  therefore is recommended he have a CTA abdomen pelvis with BR TR protocol.  This has been ordered. Patient placed on cardiac monitor. EKG without ischemic changes. Delta troponin is flat. Dout ACS or PE as cause of symptoms. CBC with leukopenia 3.5, has history of the same. CMP shows no significant electrolyte derangement, no renal insufficiency.  ALT only slightly elevated at 51. Lipase is slightly elevated at 56. INR 2.1.  COVID test is negative. UA is negative for infection.  Patient given morphine and GI cocktail.  CTA AP shows that TIPS catheter remains occluded.  Patient with chronic occlusion of portal vein with cavernous transmission of the porta hepatitis.  Radiologist also commented on additional upper abdominal venous collateralization is seen about the esophagus, stomach and gallbladder fossa, suspect esophageal varices as well.  There are no signs of pancreatitis.  Chart review shows patient is followed outpatient by [facility].  He had an upper endoscopy 03/01/20.  I am unable to see the results.  Unsure if varices are new or not. Consulted on call IR attending and discussed patient with Dr. Laurence Ferrari who viewed images and patient's history. He does not notice any acute findings. Stable compared to February imaging.  Given there are no acute changes he reports there are no indications for emergent IR intervention.   Discussed all results with patient. He is pain free after medications here. He is agreeable to plan for discharge home with close outpatient follow-up with GI regarding the varices.  At this time he has no complaints of bleeding and is stable for outpatient further evaluation.The patient appears reasonably screened and/or stabilized for discharge and I doubt any other medical condition or other [facility] requiring further screening, evaluation, or treatment in the ED at this time prior to discharge. The patient is safe for discharge  with strict return precautions discussed. Findings and plan of care discussed with supervising physician Dr. Ralene Bathe who is agreeable with plan of care.  Portions of this note were generated with [employer]. Dictation errors may occur despite best attempts at proofreading.    Final Clinical Impression(s) / ED Diagnoses Final diagnoses:  Portal vein thrombosis  Atypical chest pain    Rx / DC Orders ED Discharge Orders     None        Lewanda Rife 08/21/20 2141    Quintella Reichert, MD 08/22/20 1531

## 2020-08-21 NOTE — Discharge Instructions (Addendum)
The CT scan did not show any worsening signs of blood clots in your stent.  It did show that you likely have esophageal varices.  You will need to follow-up outpatient with your GI doctors as soon as possible for further evaluation of this.  If you develop any worsening symptoms you should return immediately to the ER or call 911.  Recommend you follow-up with your primary care doctor as well for further valuation of your pain today.

## 2020-09-03 ENCOUNTER — Emergency Department (HOSPITAL_COMMUNITY)
Admission: EM | Admit: 2020-09-03 | Discharge: 2020-09-03 | Disposition: A | Discharge status: Home / Self Care | Payer: BC Managed Care – PPO | Attending: Emergency Medicine | Admitting: Emergency Medicine

## 2020-09-03 ENCOUNTER — Encounter (HOSPITAL_COMMUNITY): Payer: Self-pay | Admitting: Emergency Medicine

## 2020-09-03 DIAGNOSIS — Z8546 Personal history of malignant neoplasm of prostate: Secondary | ICD-10-CM | POA: Diagnosis not present

## 2020-09-03 DIAGNOSIS — Z8616 Personal history of COVID-19: Secondary | ICD-10-CM | POA: Diagnosis not present

## 2020-09-03 DIAGNOSIS — I10 Essential (primary) hypertension: Secondary | ICD-10-CM | POA: Insufficient documentation

## 2020-09-03 DIAGNOSIS — K644 Residual hemorrhoidal skin tags: Secondary | ICD-10-CM | POA: Diagnosis not present

## 2020-09-03 DIAGNOSIS — Z87891 Personal history of nicotine dependence: Secondary | ICD-10-CM | POA: Diagnosis not present

## 2020-09-03 DIAGNOSIS — K625 Hemorrhage of anus and rectum: Secondary | ICD-10-CM | POA: Insufficient documentation

## 2020-09-03 DIAGNOSIS — Z7901 Long term (current) use of anticoagulants: Secondary | ICD-10-CM | POA: Insufficient documentation

## 2020-09-03 DIAGNOSIS — Z7984 Long term (current) use of oral hypoglycemic drugs: Secondary | ICD-10-CM | POA: Insufficient documentation

## 2020-09-03 LAB — COMPREHENSIVE METABOLIC PANEL
ALT: 134 U/L — ABNORMAL HIGH (ref 0–44)
AST: 152 U/L — ABNORMAL HIGH (ref 15–41)
Albumin: 3.7 g/dL (ref 3.5–5.0)
Alkaline Phosphatase: 71 U/L (ref 38–126)
Anion gap: 5 (ref 5–15)
BUN: 15 mg/dL (ref 8–23)
CO2: 24 mmol/L (ref 22–32)
Calcium: 9 mg/dL (ref 8.9–10.3)
Chloride: 107 mmol/L (ref 98–111)
Creatinine, Ser: 0.95 mg/dL (ref 0.61–1.24)
GFR, Estimated: >60 mL/min (ref >60)
Glucose, Bld: 100 mg/dL — ABNORMAL HIGH (ref 70–99)
Potassium: 4 mmol/L (ref 3.5–5.1)
Sodium: 136 mmol/L (ref 135–145)
Total Bilirubin: 1.1 mg/dL (ref 0.3–1.2)
Total Protein: 7.3 g/dL (ref 6.5–8.1)

## 2020-09-03 LAB — CBC WITH DIFFERENTIAL/PLATELET
Band Neutrophils: 0 %
Basophils Relative: 4 %
Blasts: NONE SEEN %
Eosinophils Relative: 3 %
HCT: 38.9 % — ABNORMAL LOW (ref 39.0–52.0)
Hemoglobin: 12.8 g/dL — ABNORMAL LOW (ref 13.0–17.0)
Lymphocytes Relative: 45 %
MCH: 27.1 pg (ref 26.0–34.0)
MCHC: 32.9 g/dL (ref 30.0–36.0)
MCV: 82.2 fL (ref 80.0–100.0)
Metamyelocytes Relative: NONE SEEN %
Monocytes Relative: 18 %
Myelocytes: NONE SEEN %
Neutrophils Relative %: 30 %
Platelets: 235 10*3/uL (ref 150–400)
Promyelocytes Relative: NONE SEEN %
RBC Morphology: NORMAL
RBC: 4.73 MIL/uL (ref 4.22–5.81)
RDW: 14.8 % (ref 11.5–15.5)
WBC Morphology: NORMAL
WBC: 2.3 10*3/uL — ABNORMAL LOW (ref 4.0–10.5)
nRBC: 0 % (ref 0.0–0.2)
nRBC: NONE SEEN /100 WBC

## 2020-09-03 LAB — PROTIME-INR
INR: 2.1 — ABNORMAL HIGH (ref 0.8–1.2)
Prothrombin Time: 23.4 seconds — ABNORMAL HIGH (ref 11.4–15.2)

## 2020-09-03 LAB — TYPE AND SCREEN
ABO/RH(D): B POS
Antibody Screen: NEGATIVE

## 2020-09-03 NOTE — ED Provider Notes (Signed)
[facility] DEPT Provider Note   CSN: [id number] Arrival date & time: 09/03/20  1219     History Chief Complaint  Patient presents with   Rectal Bleeding    Harry Lucero is a 65 y.o. male.  65 yo M with a chief complaints of rectal bleeding.  Patient noticed on the toilet paper after he wiped today bright red blood.  Never had this problem before.  Feeling his stool was little bit hard today does have history of hemorrhoids.  Noticed that there was some blood in the bowl.  Denies any dark stool.  Denies abdominal pain denies fevers.  He has anticoagulated secondary to a portal vein thrombosis.  The history is provided by the patient.  Rectal Bleeding Quality:  Bright red Amount:  Scant Duration:  2 hours Timing:  Rare Chronicity:  New Similar prior episodes: yes   Relieved by:  Nothing Worsened by:  Nothing Ineffective treatments:  None tried Associated symptoms: no abdominal pain, no fever and no vomiting       Past Medical History:  Diagnosis Date   Antiphospholipid antibody syndrome (HCC)    Cough due to ACE inhibitor    clear mucous due to lisinopril   COVID 01/06-2020   asymptomatic   Headache    Hypertension    off lisinopril since 05-08-2020   Lupus anticoagulant positive    Portal vein thrombosis    Pre-diabetes    Prostate cancer [facility])    Wears glasses    for reading    Patient Active Problem List   Diagnosis Date Noted   Malignant neoplasm of prostate ([place]) 03/27/2020   Left upper quadrant pain 02/29/2020   Rib pain 02/29/2020   Diabetes ([place]) 02/22/2020   History of prostate cancer 02/22/2020   Pancreatic cyst 02/22/2020   Antiphospholipid antibody syndrome ([place]) 01/13/2020   VTE (venous thromboembolism) 12/11/2019   Elevated LFTs 12/11/2019   Portal vein thrombosis 12/05/2019   Bacteremia 11/02/2019   S/P percutaneous transluminal angioplasty (PTA) 10/31/2019   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 PACE   IR RADIOLOGIST EVAL & MGMT  01/03/2020   IR RADIOLOGIST EVAL & MGMT  04/18/2020   IR TIPS REVISION MOD SED  01/09/2020   PROSTATE BIOPSY     RADIOACTIVE SEED IMPLANT N/A 05/31/2020   Procedure: RADIOACTIVE SEED IMPLANT/BRACHYTHERAPY IMPLANT;  Surgeon: Robley Fries, MD;  Location: [facility];  Service: Urology;  Laterality: N/A;  SEEDS IMPLANTED   RADIOLOGY WITH ANESTHESIA N/A 01/09/2020   Procedure: IR WITH ANESTHESIA TIPS REVISION;  Surgeon: Radiologist, Medication, MD;  Location: [facility];  Service: Radiology;  Laterality: N/A;   TIPS PROCEDURE         Family History  Problem Relation Age of Onset   Prostate cancer Neg Hx    Colon cancer Neg Hx    Pancreatic cancer Neg Hx    Breast cancer Neg Hx     Social History   Tobacco Use   Smoking status: Former    Packs/day: 1.00    Years: 20.00  Pack years: 20.00    Types: Cigarettes    Quit date: 02/24/1994    Years since quitting: 26.5   Smokeless tobacco: Never  Vaping Use   Vaping Use: Never used  Substance Use Topics   Alcohol use: No   Drug use: No    Comment: hx of marijuana and crack 25 yrs ago last used    Home Medications Prior to Admission medications   Medication Sig Start Date End Date Taking? Authorizing Provider  Blood Glucose Monitoring Suppl (CONTOUR NEXT EZ) w/Device KIT See admin instructions. 03/02/20   [provider]  CONTOUR NEXT TEST test strip daily. 03/01/20   [provider]  glipiZIDE (GLUCOTROL XL) 2.5 MG 24 hr tablet Take 2.5 mg by  mouth daily. 02/22/20   [provider]  Microlet Lancets MISC daily. 03/01/20   [provider]  Multiple Vitamin (MULTIVITAMIN) tablet Take 1 tablet by mouth daily.    [provider]  pravastatin (PRAVACHOL) 20 MG tablet Take 20 mg by mouth daily. 02/22/20   [provider]  traMADol (ULTRAM) 50 MG tablet Take 1 tablet (50 mg total) by mouth every 6 (six) hours as needed. 05/31/20 05/31/21  Robley Fries, MD  Vitamin D, Ergocalciferol, (DRISDOL) 1.25 MG (50000 UNIT) CAPS capsule Take 50,000 Units by mouth once a week. 06/10/20   [provider]  warfarin (COUMADIN) 10 MG tablet Take 1 tablet (10 mg total) by mouth daily. Patient taking differently: Take 10 mg by mouth daily. Monday to Friday 10 mg 03/23/20   [name], MD  warfarin (COUMADIN) 5 MG tablet TAKE 2 TABLETS(10 MG) BY MOUTH DAILY Patient taking differently: Takes 5 mg on saturday and sunday 05/16/20   [name], MD  ranitidine (ZANTAC) 150 MG tablet Take 1 tablet (150 mg total) by mouth 2 (two) times daily. Patient not taking: Reported on 12/11/2019 03/19/18 12/11/19  Muthersbaugh, Jarrett Soho, PA-C    Allergies    Lisinopril  Review of Systems   Review of Systems  Constitutional:  Negative for chills and fever.  HENT:  Negative for congestion and facial swelling.   Eyes:  Negative for discharge and visual disturbance.  Respiratory:  Negative for shortness of breath.   Cardiovascular:  Negative for chest pain and palpitations.  Gastrointestinal:  Positive for blood in stool, constipation and hematochezia. Negative for abdominal pain, diarrhea and vomiting.  Musculoskeletal:  Negative for arthralgias and myalgias.  Skin:  Negative for color change and rash.  Neurological:  Negative for tremors, syncope and headaches.  Psychiatric/Behavioral:  Negative for confusion and dysphoric mood.    Physical Exam Updated Vital Signs BP (!) 138/97 (BP Location: Right Arm)   Pulse 63   Temp  99 F (37.2 C) (Oral)   Resp 16   Ht _0  (1.727 m)   Wt 90.7 kg   SpO2 99%   BMI 30.41 kg/m   Physical Exam Vitals and nursing note reviewed.  Constitutional:      Appearance: He is well-developed.  HENT:     Head: Normocephalic and atraumatic.  Eyes:     Pupils: Pupils are equal, round, and reactive to light.  Neck:     Vascular: No JVD.  Cardiovascular:     Rate and Rhythm: Normal rate and regular rhythm.     Heart sounds: No murmur heard.   No friction rub. No gallop.  Pulmonary:     Effort: No respiratory distress.     Breath sounds: No wheezing.  Abdominal:     General: There is no distension.     Tenderness: There is no abdominal tenderness. There is no guarding or rebound.  Genitourinary:    Comments: Small external hemorrhoids not bleeding.  No stool in the vault no gross blood. Musculoskeletal:        General: Normal range of motion.     Cervical back: Normal range of motion and neck supple.  Skin:    Coloration: Skin is not pale.     Findings: No rash.  Neurological:     Mental Status: He is alert and oriented to person, place, and time.  Psychiatric:        Behavior: Behavior normal.    ED Results / Procedures / Treatments   Labs (all labs ordered are listed, but only abnormal results are displayed) Labs Reviewed  CBC WITH DIFFERENTIAL/PLATELET - Abnormal; Notable for the following components:      Result Value   WBC 2.3 (*)    Hemoglobin 12.8 (*)    HCT 38.9 (*)    All other components within normal limits  COMPREHENSIVE METABOLIC PANEL - Abnormal; Notable for the following components:   Glucose, Bld 100 (*)    AST 152 (*)    ALT 134 (*)    All other components within normal limits  PROTIME-INR - Abnormal; Notable for the following components:   Prothrombin Time 23.4 (*)    INR 2.1 (*)    All other components within normal limits  PATHOLOGIST SMEAR REVIEW  TYPE AND SCREEN    EKG None  Radiology No results  found.  Procedures Procedures   Medications Ordered in ED Medications - No data to display  ED Course  I have reviewed the triage vital signs and the nursing notes.  Pertinent labs & imaging results that were available during my care of the patient were reviewed by me and considered in my medical decision making (see chart for details).    MDM Rules/Calculators/A&P                          65 yo M with a chief complaints of rectal bleeding.  Noticed earlier this morning.  His only had 1 bowel movements.  Has been slightly more constipated than normal.  Hemoglobin higher than baseline.  Rectal exam without stool in the vault no gross blood.  We will discharge the patient home.  PCP follow-up.  2:25 PM:  I have discussed the diagnosis/risks/treatment options with the patient and believe the pt to be eligible for discharge home to follow-up with GI, PCP. We also discussed returning to the ED immediately if new or worsening sx occur. We discussed the sx which are most concerning (e.g., sudden worsening pain, fever, inability to tolerate by mouth, recurrent event, lightheadedness, sob) that necessitate immediate return. Medications administered to the patient during their visit and any new prescriptions provided to the patient are listed below.  Medications given during this visit Medications - No data to display   The patient appears reasonably screen and/or stabilized for discharge and I doubt any other medical condition or other [facility] requiring further screening, evaluation, or treatment in the ED at this time prior to discharge.   Final Clinical Impression(s) / ED Diagnoses Final diagnoses:  Rectal bleeding    Rx / DC Orders ED Discharge Orders     None        Deno Etienne, DO 09/03/20 1425

## 2020-09-03 NOTE — ED Provider Notes (Signed)
Emergency Medicine Provider Triage Evaluation Note  Harry Lucero , a 65 y.o. male  was evaluated in triage.  Pt complains of patient presents with rectal bleeding, started today, states he noticed blood on the toilet paper as well as the stool.  Denies fatigue, shortness of breath, abdominal pain.  No history of GI bleeds, stomach ulcers, diverticulitis, currently on warfarin due to prostate cancer..  Review of Systems  Positive: Rectal bleeding, rectal pain Negative: Denies fatigue, shortness of breath  Physical Exam  BP (!) 138/97 (BP Location: Right Arm)   Pulse 63   Temp 99 F (37.2 C) (Oral)   Resp 16   Ht 5\' 8"  (1.727 m)   Wt 90.7 kg   SpO2 99%   BMI 30.41 kg/m  Gen:   Awake, no distress   Resp:  Normal effort  MSK:   Moves extremities without difficulty  Other:    Medical Decision Making  Medically screening exam initiated at 12:59 PM.  Appropriate orders placed.  [name] was informed that the remainder of the evaluation will be completed by another provider, this initial triage assessment does not replace that evaluation, and the importance of remaining in the ED until their evaluation is complete.  Patient presents with rectal bleeding, lab work has been ordered, patient need further work-up here in the emergency department.   [name], PA-C 09/03/20 [place], [place], DO 09/03/20 1617

## 2020-09-03 NOTE — Discharge Instructions (Addendum)
Please return for recurrent or worsening bleeding or if you feel lightheaded like you may pass out.  Please follow-up with your gastroenterologist in the office.

## 2020-09-03 NOTE — ED Triage Notes (Signed)
Patient reports bright red blood in stool x1 this morning. Denies pain. Hx prostate cancer.

## 2020-09-13 ENCOUNTER — Other Ambulatory Visit: Payer: Self-pay

## 2020-09-13 ENCOUNTER — Inpatient Hospital Stay: Payer: BC Managed Care – PPO | Attending: Hematology and Oncology

## 2020-09-13 DIAGNOSIS — D6861 Antiphospholipid syndrome: Secondary | ICD-10-CM | POA: Insufficient documentation

## 2020-09-13 LAB — PROTIME-INR
INR: 3.6 — ABNORMAL HIGH (ref 0.8–1.2)
Prothrombin Time: 36 seconds — ABNORMAL HIGH (ref 11.4–15.2)

## 2020-09-14 ENCOUNTER — Other Ambulatory Visit: Payer: BC Managed Care – PPO

## 2020-09-28 ENCOUNTER — Other Ambulatory Visit: Payer: Self-pay | Admitting: Hematology and Oncology

## 2020-09-28 ENCOUNTER — Other Ambulatory Visit: Payer: Self-pay | Admitting: *Deleted

## 2020-10-05 ENCOUNTER — Other Ambulatory Visit: Payer: Self-pay | Admitting: Gastroenterology

## 2020-10-05 ENCOUNTER — Other Ambulatory Visit (HOSPITAL_COMMUNITY): Payer: Self-pay | Admitting: Gastroenterology

## 2020-10-05 DIAGNOSIS — I81 Portal vein thrombosis: Secondary | ICD-10-CM

## 2020-10-12 ENCOUNTER — Other Ambulatory Visit: Payer: Self-pay

## 2020-10-12 ENCOUNTER — Inpatient Hospital Stay: Payer: BC Managed Care – PPO | Attending: Hematology and Oncology

## 2020-10-12 DIAGNOSIS — D6861 Antiphospholipid syndrome: Secondary | ICD-10-CM | POA: Diagnosis present

## 2020-10-12 LAB — PROTIME-INR
INR: 1.6 — ABNORMAL HIGH (ref 0.8–1.2)
Prothrombin Time: 19.2 seconds — ABNORMAL HIGH (ref 11.4–15.2)

## 2020-10-15 ENCOUNTER — Ambulatory Visit (HOSPITAL_COMMUNITY)
Admission: RE | Admit: 2020-10-15 | Discharge: 2020-10-15 | Disposition: A | Discharge status: Home / Self Care | Payer: BC Managed Care – PPO | Source: Ambulatory Visit | Attending: Gastroenterology | Admitting: Gastroenterology

## 2020-10-15 ENCOUNTER — Other Ambulatory Visit: Payer: Self-pay

## 2020-10-15 DIAGNOSIS — I81 Portal vein thrombosis: Secondary | ICD-10-CM

## 2020-10-16 ENCOUNTER — Other Ambulatory Visit: Payer: Self-pay

## 2020-10-16 ENCOUNTER — Telehealth: Payer: Self-pay

## 2020-10-16 DIAGNOSIS — C61 Malignant neoplasm of prostate: Secondary | ICD-10-CM

## 2020-10-16 NOTE — Telephone Encounter (Signed)
Pt called and LVM stating he was returning Dr Geralyn Flash call from 8/19 regarding INR. Pt states he had to stop Coumadin for 5 days while he had endoscopy. Pt states he has resumed tx. Per MD no dose change recommended at this time. Pt is to RTC 10/26/20 at 1300 for f/u labs.

## 2020-10-19 ENCOUNTER — Other Ambulatory Visit: Payer: Self-pay | Admitting: Interventional Radiology

## 2020-10-19 DIAGNOSIS — R1010 Upper abdominal pain, unspecified: Secondary | ICD-10-CM

## 2020-10-23 ENCOUNTER — Ambulatory Visit
Admission: RE | Admit: 2020-10-23 | Discharge: 2020-10-23 | Disposition: A | Discharge status: Home / Self Care | Payer: BC Managed Care – PPO | Source: Ambulatory Visit | Attending: Interventional Radiology | Admitting: Interventional Radiology

## 2020-10-23 ENCOUNTER — Encounter: Payer: Self-pay | Admitting: *Deleted

## 2020-10-23 DIAGNOSIS — R1010 Upper abdominal pain, unspecified: Secondary | ICD-10-CM

## 2020-10-23 HISTORY — PX: IR RADIOLOGIST EVAL & MGMT: IMG5224

## 2020-10-23 NOTE — Progress Notes (Signed)
Chief Complaint: Abdominal pain  History of present illness: 65 year old male with no significant prior past medical history to presented to outside hospital with abdominal pain and was found to have portal vein thrombus for which he underwent portal vein thrombectomy followed by TIPS creation in September 2021.  He presented approximately 2 months later with occluded TIPS and recurrent abdominal pain.  He was found to have antiphospholipid syndrome and therefore started on anticoagulation.  TIPS recanalization was attempted on 01/09/20 with inability to cannulate the central aspect of the TIPS and unsuccessful cannulation of the splenic vein via transplenic approach.    He presents today with increasing, intermittent abdominal pain.  This pain is vague and difficult for him to qualify.  He states that it is worse in the mornings when he wakes up.  It is not related to or exacerbated by eating.  The pain will sometimes acutely become exacerbated, and has on multiple occasions required him to miss work.   He denies any hematemesis, dark stools, or bright red blood per rectum.  No abdominal distention.  He has presented to the ED multiple times for pain, with inconclusive workups without direct evidence of etiology, including CTA abdomen/pelvis on 08/21/20 which demonstrates some mild esophageal varices, cavernous transformation of the portal vein, no ascites.  He underwent EGD with EUS on 10/08/20 with Dr. Alan Ripper of [facility] which demonstrated gastritis, irregular z-line, and peripancreatic and periportal varcies.    He continues to take coumadin.  He has recently been treated by prostate cancer by Dr. Claudia Desanctis and states that things are going well on that front.    Past Medical History:  Diagnosis Date   Antiphospholipid antibody syndrome (HCC)    Cough due to ACE inhibitor    clear mucous due to lisinopril   COVID 01/06-2020   asymptomatic   Headache    Hypertension    off lisinopril  since 05-08-2020   Lupus anticoagulant positive    Portal vein thrombosis    Pre-diabetes    Prostate cancer ([place])    Wears glasses    for reading    Past Surgical History:  Procedure Laterality Date   CYSTOSCOPY N/A 05/31/2020   Procedure: CYSTOSCOPY FLEXIBLE;  Surgeon: Robley Fries, MD;  Location: [facility];  Service: Urology;  Laterality: N/A;  NO SEEDS FOUND BY DR. PACE   IR RADIOLOGIST EVAL & MGMT  01/03/2020   IR RADIOLOGIST EVAL & MGMT  04/18/2020   IR RADIOLOGIST EVAL & MGMT  10/23/2020   IR TIPS  11/02/2019   IR TIPS REVISION MOD SED  01/09/2020   PROSTATE BIOPSY     RADIOACTIVE SEED IMPLANT N/A 05/31/2020   Procedure: RADIOACTIVE SEED IMPLANT/BRACHYTHERAPY IMPLANT;  Surgeon: Robley Fries, MD;  Location: [facility];  Service: Urology;  Laterality: N/A;  SEEDS IMPLANTED   RADIOLOGY WITH ANESTHESIA N/A 01/09/2020   Procedure: IR WITH ANESTHESIA TIPS REVISION;  Surgeon: Radiologist, Medication, MD;  Location: [facility];  Service: Radiology;  Laterality: N/A;   TIPS PROCEDURE      Allergies: Lisinopril  Medications: Prior to Admission medications   Medication Sig Start Date End Date Taking? Authorizing Provider  Blood Glucose Monitoring Suppl (CONTOUR NEXT EZ) w/Device KIT See admin instructions. 03/02/20   [provider]  CONTOUR NEXT TEST test strip daily. 03/01/20   [provider]  glipiZIDE (GLUCOTROL XL) 2.5 MG 24 hr tablet Take 2.5 mg by mouth daily. 02/22/20  [provider]  Microlet Lancets MISC daily. 03/01/20   [provider]  Multiple Vitamin (MULTIVITAMIN) tablet Take 1 tablet by mouth daily.    [provider]  pravastatin (PRAVACHOL) 20 MG tablet Take 20 mg by mouth daily. 02/22/20   [provider]  traMADol (ULTRAM) 50 MG tablet Take 1 tablet (50 mg total) by mouth every 6 (six) hours as needed. 05/31/20 05/31/21  Robley Fries, MD  Vitamin D, Ergocalciferol, (DRISDOL) 1.25 MG  (50000 UNIT) CAPS capsule Take 50,000 Units by mouth once a week. 06/10/20   [provider]  warfarin (COUMADIN) 10 MG tablet Take 1 tablet (10 mg total) by mouth daily. Patient taking differently: Take 10 mg by mouth daily. Monday to Friday 10 mg 03/23/20   [name], MD  warfarin (COUMADIN) 5 MG tablet Takes 5 mg on saturday and sunday 10/01/20   [name], MD  ranitidine (ZANTAC) 150 MG tablet Take 1 tablet (150 mg total) by mouth 2 (two) times daily. Patient not taking: Reported on 12/11/2019 03/19/18 12/11/19  Muthersbaugh, Jarrett Soho, PA-C     Family History  Problem Relation Age of Onset   Prostate cancer Neg Hx    Colon cancer Neg Hx    Pancreatic cancer Neg Hx    Breast cancer Neg Hx     Social History   Socioeconomic History   Marital status: Married    Spouse name: [name]   Number of children: 3   Years of education: Not on file   Highest education level: Not on file  Occupational History   Not on file  Tobacco Use   Smoking status: Former    Packs/day: 1.00    Years: 20.00    Pack years: 20.00    Types: Cigarettes    Quit date: 02/24/1994    Years since quitting: 26.6   Smokeless tobacco: Never  Vaping Use   Vaping Use: Never used  Substance and Sexual Activity   Alcohol use: No   Drug use: No    Comment: hx of marijuana and crack 25 yrs ago last used   Sexual activity: Yes  Other Topics Concern   Not on file  Social History Narrative   Not on file   Social Determinants of Health   Financial Resource Strain: High Risk   Difficulty of Paying Living Expenses: Very hard  Food Insecurity: Food Insecurity Present   Worried About Charity fundraiser in the Last Year: Sometimes true   Ran Out of Food in the Last Year: Not on file  Transportation Needs: Not on file  Physical Activity: Not on file  Stress: Not on file  Social Connections: Not on file     Vital Signs: BP (!) 136/93 (BP Location: Right Arm)   Pulse 66   SpO2 97%   Physical  Exam Constitutional:      General: He is not in acute distress. HENT:     Head: Normocephalic.     Mouth/Throat:     Mouth: Mucous membranes are moist.  Eyes:     General: No scleral icterus. Cardiovascular:     Rate and Rhythm: Normal rate and regular rhythm.  Pulmonary:     Effort: Pulmonary effort is normal.  Abdominal:     General: There is no distension.  Musculoskeletal:        General: No swelling.  Skin:    Coloration: Skin is not jaundiced.  Neurological:     Mental Status: He is alert  and oriented to person, place, and time.    Imaging: CTA AP (09/20/20) IMPRESSION: VASCULAR  1. TIPS catheter remains occluded 2. 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 3. Additional upper abdominal venous collateralization is seen about the esophagus, stomach and gallbladder fossa. Suspect esophageal varices as well. 4. No acute aortic abnormality. 5. Aortic Atherosclerosis (ICD10-I70.0). 6. Moderate ostial narrowing at the IMA origin.  NON-VASCULAR  1. Interval placement of multiple metallic prostate brachytherapy implants. No acute complication. 2. Prostatomegaly. Chronic bladder wall thickening likely the result of outlet obstruction though could correlate with urinalysis as warranted. 3. Diffuse hepatic hypoattenuation, similar to prior. Can be seen with fatty infiltration or intrinsic liver disease.   Labs:  CBC: Recent Labs    01/10/20 0239 05/28/20 1122 08/21/20 1400 09/03/20 1252  WBC 7.4 3.0* 3.5* 2.3*  HGB 11.9* 12.7* 12.0* 12.8*  HCT 36.8* 38.9* 37.6* 38.9*  PLT 282 257 229 235    COAGS: Recent Labs    12/11/19 1540 12/11/19 2204 12/12/19 0607 01/09/20 0803 05/28/20 1122 05/31/20 1135 08/21/20 1400 09/03/20 1252 09/13/20 1407 10/12/20 0735  INR  --   --   --    < > 2.3*   < > 2.1* 2.1* 3.6* 1.6*  APTT 90* 109* 95*  --  36  --   --   --   --   --    < > = values in this interval not  displayed.    BMP: Recent Labs    01/10/20 0239 04/13/20 1433 05/28/20 1122 08/21/20 1400 09/03/20 1252  NA 137  --  139 138 136  K 4.9  --  4.7 4.3 4.0  CL 104  --  110 107 107  CO2 26  --  _0 GLUCOSE 127*  --  111* 92 100*  BUN 14  --  _1 CALCIUM 9.9  --  9.6 9.6 9.0  CREATININE 1.11 1.00 1.04 1.08 0.95  GFRNONAA >60  --  >60 >60 >60    LIVER FUNCTION TESTS: Recent Labs    01/10/20 0239 05/28/20 1122 08/21/20 1400 09/03/20 1252  BILITOT 0.8 1.5* 1.0 1.1  AST 39 90* 39 152*  ALT 119* 78* 51* 134*  ALKPHOS 77 65 59 71  PROT 7.0 7.1 6.4* 7.3  ALBUMIN 3.4* 3.7 3.2* 3.7    Assessment and Plan: 65 year old non-cirrhotic male with history of portal thrombus and cavernous transformation status post TIPS creation and portal thrombectomy at outside facility in September 2021.  Failed attempt at TIPS recanalization in November 2021 by me.  He compensated well from a portal hypertension standpoint initially, but now has experienced increasing and persistent abdominal pain which is vague and difficult to determine etiology.  Endoscopic evaluation has been relatively unrevealing.    It is possible that his portal hypertension is causing his pain, but difficult to prove.  In the absence of gastrointestinal hemorrhage or recurrent ascites, there is little initiative to proceed with re-attempted TIPS/portal vein recanalization with advanced techniques (transsplenic access, laser atherectomy of stent, etc.).    We discussed this possible procedure in detail and the risks/benefits.  We discussed that we could undergo this complex procedure and his pain may not resolve.  At this time, he and his wife would like to ponder the next steps.  -plan to follow up in 1 month in IR clinic -the patient was instructed to notify me with any episodes of  GI bleeding or severely worsening abdominal pain  Electronically Signed: Rosanne Ashing Suttle 10/23/2020, 4:31 PM   I spent a total of  40 Minutes in face to face in clinical consultation, greater than 50% of which was counseling/coordinating care for portal thrombosis.

## 2020-10-24 ENCOUNTER — Other Ambulatory Visit: Payer: Self-pay | Admitting: Interventional Radiology

## 2020-10-24 DIAGNOSIS — I81 Portal vein thrombosis: Secondary | ICD-10-CM

## 2020-10-26 ENCOUNTER — Other Ambulatory Visit: Payer: Self-pay

## 2020-10-26 ENCOUNTER — Inpatient Hospital Stay: Payer: BC Managed Care – PPO | Attending: Hematology and Oncology

## 2020-10-26 DIAGNOSIS — C61 Malignant neoplasm of prostate: Secondary | ICD-10-CM | POA: Insufficient documentation

## 2020-10-26 DIAGNOSIS — Z79899 Other long term (current) drug therapy: Secondary | ICD-10-CM | POA: Insufficient documentation

## 2020-10-26 DIAGNOSIS — D6862 Lupus anticoagulant syndrome: Secondary | ICD-10-CM | POA: Insufficient documentation

## 2020-10-26 DIAGNOSIS — Z7984 Long term (current) use of oral hypoglycemic drugs: Secondary | ICD-10-CM | POA: Diagnosis not present

## 2020-10-26 DIAGNOSIS — Z923 Personal history of irradiation: Secondary | ICD-10-CM | POA: Insufficient documentation

## 2020-10-26 DIAGNOSIS — I81 Portal vein thrombosis: Secondary | ICD-10-CM | POA: Diagnosis present

## 2020-10-26 DIAGNOSIS — D6861 Antiphospholipid syndrome: Secondary | ICD-10-CM | POA: Diagnosis present

## 2020-10-26 DIAGNOSIS — Z7901 Long term (current) use of anticoagulants: Secondary | ICD-10-CM | POA: Diagnosis not present

## 2020-10-26 DIAGNOSIS — D72819 Decreased white blood cell count, unspecified: Secondary | ICD-10-CM | POA: Insufficient documentation

## 2020-10-26 LAB — PROTIME-INR
INR: 1.9 — ABNORMAL HIGH (ref 0.8–1.2)
Prothrombin Time: 21.4 seconds — ABNORMAL HIGH (ref 11.4–15.2)

## 2020-11-15 NOTE — Progress Notes (Signed)
 Patient Care Team: McCoy, Rachel, NP as PCP - General (Nurse Practitioner) Robin Bass, RN as Oncology Nurse Navigator  DIAGNOSIS:    ICD-10-CM   1. Antiphospholipid antibody syndrome (HCC)  D68.61 CBC with Differential (Cancer Center Only)    Protime-INR      SUMMARY OF ONCOLOGIC HISTORY: Oncology History  Malignant neoplasm of prostate (HCC)  01/30/2020 Cancer Staging   Staging form: Prostate, AJCC 8th Edition - Clinical stage from 01/30/2020: Stage IIB (cT1c, cN0, cM0, PSA: 10, Grade Group: 2) - Signed by Bruning, Ashlyn, PA-C on 03/27/2020 Histopathologic type: Adenocarcinoma, NOS Stage prefix: Initial diagnosis Prostate specific antigen (PSA) range: 10 to 19 Gleason primary pattern: 3 Gleason secondary pattern: 4 Gleason score: 7 Histologic grading system: 5 grade system Number of biopsy cores examined: 12 Number of biopsy cores positive: 3 Location of positive needle core biopsies: Both sides   03/27/2020 Initial Diagnosis   Malignant neoplasm of prostate (HCC)     CHIEF COMPLIANT: Follow-up of acute portal venous thrombosis  INTERVAL HISTORY: Harry Lucero is a 65 y.o. with above-mentioned history of acute portal venous thrombosis currently on Coumadin. 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  He presents to the clinic today for follow-up.  He has been tolerating Coumadin fairly well.  He had a couple of episodes of blood in the stool.  Recently had endoscopy which showed H. pylori and is started on antibiotics for that.  He tolerated prostate radiation fairly well.  ALLERGIES:  is allergic to lisinopril.  MEDICATIONS:  Current Outpatient Medications  Medication Sig Dispense Refill   Blood Glucose Monitoring Suppl (CONTOUR NEXT EZ) w/Device KIT See admin instructions.     CONTOUR NEXT TEST test strip daily.     glipiZIDE (GLUCOTROL XL) 2.5 MG 24 hr tablet Take 2.5 mg by mouth daily.     Microlet Lancets MISC daily.      Multiple Vitamin (MULTIVITAMIN) tablet Take 1 tablet by mouth daily.     pravastatin (PRAVACHOL) 20 MG tablet Take 20 mg by mouth daily.     traMADol (ULTRAM) 50 MG tablet Take 1 tablet (50 mg total) by mouth every 6 (six) hours as needed. 20 tablet 0   Vitamin D, Ergocalciferol, (DRISDOL) 1.25 MG (50000 UNIT) CAPS capsule Take 50,000 Units by mouth once a week.     warfarin (COUMADIN) 10 MG tablet Take 1 tablet (10 mg total) by mouth daily. (Patient taking differently: Take 10 mg by mouth daily. Monday to Friday 10 mg) 90 tablet 3   warfarin (COUMADIN) 5 MG tablet Takes 5 mg on saturday and sunday 60 tablet 3   No current facility-administered medications for this visit.    PHYSICAL EXAMINATION: ECOG PERFORMANCE STATUS: 1 - Symptomatic but completely ambulatory  Vitals:   11/16/20 0817  BP: (!) 141/98  Pulse: 66  Resp: 17  Temp: (!) 97.3 F (36.3 C)  SpO2: 100%   Filed Weights   11/16/20 0817  Weight: 202 lb 12.8 oz (92 kg)    LABORATORY DATA:  I have reviewed the data as listed CMP Latest Ref Rng & Units 09/03/2020 08/21/2020 05/28/2020  Glucose 70 - 99 mg/dL 100(H) 92 111(H)  BUN 8 - 23 mg/dL 15 15 16  Creatinine 0.61 - 1.24 mg/dL 0.95 1.08 1.04  Sodium 135 - 145 mmol/L 136 138 139  Potassium 3.5 - 5.1 mmol/L 4.0 4.3 4.7  Chloride 98 - 111 mmol/L 107 107 110  CO2 22 -   32 mmol/L _0 Calcium 8.9 - 10.3 mg/dL 9.0 9.6 9.6  Total Protein 6.5 - 8.1 g/dL 7.3 6.4(L) 7.1  Total Bilirubin 0.3 - 1.2 mg/dL 1.1 1.0 1.5(H)  Alkaline Phos 38 - 126 U/L 71 59 65  AST 15 - 41 U/L 152(H) 39 90(H)  ALT 0 - 44 U/L 134(H) 51(H) 78(H)    Lab Results  Component Value Date   WBC 2.9 (L) 11/16/2020   HGB 12.4 (L) 11/16/2020   HCT 37.4 (L) 11/16/2020   MCV 81.3 11/16/2020   PLT 211 11/16/2020   NEUTROABS 1.3 (L) 11/16/2020    ASSESSMENT & PLAN:  Antiphospholipid antibody syndrome (HCC) 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 Lab review: 01/27/2020: INR 5.7 02/23/2020: INR 1.8 03/22/2020: INR 1.6 (his INR is subtherapeutic because he is drinking green juice with lots of green leafy vegetables daily) Coumadin dose increased to 10 mg 05/04/2020: INR 2.7 (at therapeutic range) 10/26/20: INR 1.9 11/16/2020: INR 3.1 (Coumadin dose: 10 mg for 3 days and then 5 mg for 4 days) Recheck labs in 1 month.  Coumadin toxicity: Couple of episodes of blood per rectum. Prostate cancer: Status post radiation 06/2020  Leukopenia: His white count has been fluctuating over the past time 5 to 6 months.  His ANC today is 1.3.  We will watch and monitor.  He might need a bone marrow biopsy if his ANC drops significantly.  We will watch his trends every 74-monthlab checks for the next 6 months and then decide.  Return to clinic every other month for lab checks every 6 months for follow-up with me    Orders Placed This Encounter  Procedures   [place] Differential [place]    Standing Status:   Standing    Number of Occurrences:   20    Standing Expiration Date:   11/16/2021   Protime-INR    Standing Status:   Standing    Number of Occurrences:   20    Standing Expiration Date:   11/16/2021    The patient has a good understanding of the overall plan. he agrees with it. he will call with any problems that may develop before the next visit here.  Total time spent: 20 mins including face to face time and time spent for planning, charting and coordination of care  VRulon Eisenmenger MD, MPH 11/16/2020  I, KThana Ates am acting as scribe for Dr. VNicholas Lose  I have  reviewed the above documentation for accuracy and completeness, and I agree with the above.

## 2020-11-16 ENCOUNTER — Other Ambulatory Visit: Payer: Self-pay

## 2020-11-16 ENCOUNTER — Inpatient Hospital Stay: Payer: BC Managed Care – PPO

## 2020-11-16 ENCOUNTER — Inpatient Hospital Stay (HOSPITAL_BASED_OUTPATIENT_CLINIC_OR_DEPARTMENT_OTHER): Payer: BC Managed Care – PPO | Admitting: Hematology and Oncology

## 2020-11-16 DIAGNOSIS — D6861 Antiphospholipid syndrome: Secondary | ICD-10-CM

## 2020-11-16 DIAGNOSIS — C61 Malignant neoplasm of prostate: Secondary | ICD-10-CM | POA: Diagnosis not present

## 2020-11-16 LAB — CBC WITH DIFFERENTIAL (CANCER CENTER ONLY)
Abs Immature Granulocytes: 0.01 10*3/uL (ref 0.00–0.07)
Basophils Absolute: 0 10*3/uL (ref 0.0–0.1)
Basophils Relative: 1 %
Eosinophils Absolute: 0.1 10*3/uL (ref 0.0–0.5)
Eosinophils Relative: 2 %
HCT: 37.4 % — ABNORMAL LOW (ref 39.0–52.0)
Hemoglobin: 12.4 g/dL — ABNORMAL LOW (ref 13.0–17.0)
Immature Granulocytes: 0 %
Lymphocytes Relative: 37 %
Lymphs Abs: 1.1 10*3/uL (ref 0.7–4.0)
MCH: 27 pg (ref 26.0–34.0)
MCHC: 33.2 g/dL (ref 30.0–36.0)
MCV: 81.3 fL (ref 80.0–100.0)
Monocytes Absolute: 0.4 10*3/uL (ref 0.1–1.0)
Monocytes Relative: 14 %
Neutro Abs: 1.3 10*3/uL — ABNORMAL LOW (ref 1.7–7.7)
Neutrophils Relative %: 46 %
Platelet Count: 211 10*3/uL (ref 150–400)
RBC: 4.6 MIL/uL (ref 4.22–5.81)
RDW: 15.7 % — ABNORMAL HIGH (ref 11.5–15.5)
WBC Count: 2.9 10*3/uL — ABNORMAL LOW (ref 4.0–10.5)
nRBC: 0 % (ref 0.0–0.2)

## 2020-11-16 LAB — CMP (CANCER CENTER ONLY)
ALT: 58 U/L — ABNORMAL HIGH (ref 0–44)
AST: 48 U/L — ABNORMAL HIGH (ref 15–41)
Albumin: 3.7 g/dL (ref 3.5–5.0)
Alkaline Phosphatase: 78 U/L (ref 38–126)
Anion gap: 8 (ref 5–15)
BUN: 16 mg/dL (ref 8–23)
CO2: 24 mmol/L (ref 22–32)
Calcium: 8.8 mg/dL — ABNORMAL LOW (ref 8.9–10.3)
Chloride: 109 mmol/L (ref 98–111)
Creatinine: 1.17 mg/dL (ref 0.61–1.24)
GFR, Estimated: >60 mL/min (ref >60)
Glucose, Bld: 98 mg/dL (ref 70–99)
Potassium: 4.2 mmol/L (ref 3.5–5.1)
Sodium: 141 mmol/L (ref 135–145)
Total Bilirubin: 0.8 mg/dL (ref 0.3–1.2)
Total Protein: 7.3 g/dL (ref 6.5–8.1)

## 2020-11-16 LAB — PROTIME-INR
INR: 3.1 — ABNORMAL HIGH (ref 0.8–1.2)
Prothrombin Time: 32.2 seconds — ABNORMAL HIGH (ref 11.4–15.2)

## 2020-11-16 NOTE — Assessment & Plan Note (Signed)
Portal vein thrombosisdiagnosed September [facility] at [facility] when he presented with abdominal pain(positive for lupus anticoagulant) SMV thrombosis Splenic vein thrombosis  Current treatment:Switched to Coumadin 01/09/2020 (when he was diagnosed with positive lupus anticoagulant)  Hospitalization:01/09/2020-01/10/2020 attemptedTIPSrecannulization procedure in IR, unsuccessful Continue with Coumadin therapy with close monitoring. Lab review: 01/27/2020: INR 5.7 02/23/2020: INR 1.8 03/22/2020: INR 1.6(his INR is subtherapeutic because he is drinking green juice with lots of green leafy vegetables daily) Coumadin dose increased to 10 mg 05/04/2020: INR 2.7 (at therapeutic range) 10/26/20: INR 1.9  Prostate cancer: Status post radiation 06/2020 Return to clinic monthly for lab checks every 6 months for follow-up with me

## 2020-11-21 ENCOUNTER — Ambulatory Visit
Admission: RE | Admit: 2020-11-21 | Discharge: 2020-11-21 | Disposition: A | Discharge status: Home / Self Care | Payer: BC Managed Care – PPO | Source: Ambulatory Visit | Attending: Interventional Radiology | Admitting: Interventional Radiology

## 2020-11-21 DIAGNOSIS — I81 Portal vein thrombosis: Secondary | ICD-10-CM

## 2020-11-21 HISTORY — PX: IR RADIOLOGIST EVAL & MGMT: IMG5224

## 2020-11-21 NOTE — Progress Notes (Signed)
Chief Complaint: Abdominal pain  History of present illness: Prior visit HPI:  "65 year old male with no significant prior past medical history to presented to outside hospital with abdominal pain and was found to have portal vein thrombus for which he underwent portal vein thrombectomy followed by TIPS creation in September 2021.  He presented approximately 2 months later with occluded TIPS and recurrent abdominal pain.  He was found to have antiphospholipid syndrome and therefore started on anticoagulation.  TIPS recanalization was attempted on 01/09/20 with inability to cannulate the central aspect of the TIPS and unsuccessful cannulation of the splenic vein via transplenic approach.     He presents today with increasing, intermittent abdominal pain.  This pain is vague and difficult for him to qualify.  He states that it is worse in the mornings when he wakes up.  It is not related to or exacerbated by eating.  The pain will sometimes acutely become exacerbated, and has on multiple occasions required him to miss work.   He denies any hematemesis, dark stools, or bright red blood per rectum.  No abdominal distention.  He has presented to the ED multiple times for pain, with inconclusive workups without direct evidence of etiology, including CTA abdomen/pelvis on 08/21/20 which demonstrates some mild esophageal varices, cavernous transformation of the portal vein, no ascites.  He underwent EGD with EUS on 10/08/20 with Dr. Alan Ripper of [facility] which demonstrated gastritis, irregular z-line, and peripancreatic and periportal varcies.     He continues to take coumadin.  He has recently been treated by prostate cancer by Dr. Claudia Desanctis and states that things are going well on that front."   Harry Lucero presents again today with his wife with persistent abdominal pain.  He also reports recently spitting up blood in the morning, and noticing an increased amount of blood in his stool, including this  morning.  He describes it as dark red.  He denies any hematochezia or hematemesis.  He was recently found to have an INR of 3.1, and has slightly decreased his dose of coumadin.   He is motivated today to pursue portal vein recanalization.  Past Medical History:  Diagnosis Date   Antiphospholipid antibody syndrome (HCC)    Cough due to ACE inhibitor    clear mucous due to lisinopril   COVID 01/06-2020   asymptomatic   Headache    Hypertension    off lisinopril since 05-08-2020   Lupus anticoagulant positive    Portal vein thrombosis    Pre-diabetes    Prostate cancer ([place])    Wears glasses    for reading    Past Surgical History:  Procedure Laterality Date   CYSTOSCOPY N/A 05/31/2020   Procedure: CYSTOSCOPY FLEXIBLE;  Surgeon: Robley Fries, MD;  Location: [facility];  Service: Urology;  Laterality: N/A;  NO SEEDS FOUND BY DR. PACE   IR RADIOLOGIST EVAL & MGMT  01/03/2020   IR RADIOLOGIST EVAL & MGMT  04/18/2020   IR RADIOLOGIST EVAL & MGMT  10/23/2020   IR TIPS  11/02/2019   IR TIPS REVISION MOD SED  01/09/2020   PROSTATE BIOPSY     RADIOACTIVE SEED IMPLANT N/A 05/31/2020   Procedure: RADIOACTIVE SEED IMPLANT/BRACHYTHERAPY IMPLANT;  Surgeon: Robley Fries, MD;  Location: [facility];  Service: Urology;  Laterality: N/A;  SEEDS IMPLANTED   RADIOLOGY WITH ANESTHESIA N/A 01/09/2020   Procedure: IR WITH ANESTHESIA TIPS REVISION;  Surgeon: Radiologist, Medication, MD;  Location:  [facility];  Service: Radiology;  Laterality: N/A;   TIPS PROCEDURE      Allergies: Lisinopril  Medications: Prior to Admission medications   Medication Sig Start Date End Date Taking? Authorizing Provider  Blood Glucose Monitoring Suppl (CONTOUR NEXT EZ) w/Device KIT See admin instructions. 03/02/20   [provider]  CONTOUR NEXT TEST test strip daily. 03/01/20   [provider]  glipiZIDE (GLUCOTROL XL) 2.5 MG 24 hr tablet Take 2.5 mg by mouth daily. 02/22/20    [provider]  Microlet Lancets MISC daily. 03/01/20   [provider]  Multiple Vitamin (MULTIVITAMIN) tablet Take 1 tablet by mouth daily.    [provider]  pravastatin (PRAVACHOL) 20 MG tablet Take 20 mg by mouth daily. 02/22/20   [provider]  traMADol (ULTRAM) 50 MG tablet Take 1 tablet (50 mg total) by mouth every 6 (six) hours as needed. 05/31/20 05/31/21  Robley Fries, MD  Vitamin D, Ergocalciferol, (DRISDOL) 1.25 MG (50000 UNIT) CAPS capsule Take 50,000 Units by mouth once a week. 06/10/20   [provider]  warfarin (COUMADIN) 10 MG tablet Take 1 tablet (10 mg total) by mouth daily. Patient taking differently: Take 10 mg by mouth daily. Monday to Friday 10 mg 03/23/20   [name], MD  warfarin (COUMADIN) 5 MG tablet Takes 5 mg on saturday and sunday 10/01/20   [name], MD  ranitidine (ZANTAC) 150 MG tablet Take 1 tablet (150 mg total) by mouth 2 (two) times daily. Patient not taking: Reported on 12/11/2019 03/19/18 12/11/19  Muthersbaugh, Jarrett Soho, PA-C     Family History  Problem Relation Age of Onset   Prostate cancer Neg Hx    Colon cancer Neg Hx    Pancreatic cancer Neg Hx    Breast cancer Neg Hx     Social History   Socioeconomic History   Marital status: Married    Spouse name: Harry Lucero   Number of children: 3   Years of education: Not on file   Highest education level: Not on file  Occupational History   Not on file  Tobacco Use   Smoking status: Former    Packs/day: 1.00    Years: 20.00    Pack years: 20.00    Types: Cigarettes    Quit date: 02/24/1994    Years since quitting: 26.7   Smokeless tobacco: Never  Vaping Use   Vaping Use: Never used  Substance and Sexual Activity   Alcohol use: No   Drug use: No    Comment: hx of marijuana and crack 25 yrs ago last used   Sexual activity: Yes  Other Topics Concern   Not on file  Social History Narrative   Not on file   Social Determinants of Health    Financial Resource Strain: High Risk   Difficulty of Paying Living Expenses: Very hard  Food Insecurity: Food Insecurity Present   Worried About Charity fundraiser in the Last Year: Sometimes true   Ran Out of Food in the Last Year: Not on file  Transportation Needs: Not on file  Physical Activity: Not on file  Stress: Not on file  Social Connections: Not on file     Vital Signs: BP (!) 148/91   Pulse 64   SpO2 98%   Physical Exam Constitutional:      General: He is not in acute distress. HENT:     Head: Normocephalic.     Mouth/Throat:     Mouth:  Mucous membranes are moist.  Cardiovascular:     Rate and Rhythm: Normal rate and regular rhythm.     Heart sounds: Normal heart sounds.  Pulmonary:     Breath sounds: Normal breath sounds.  Abdominal:     General: There is no distension.  Musculoskeletal:     Right lower leg: No edema.     Left lower leg: No edema.  Skin:    General: Skin is warm and dry.     Coloration: Skin is not jaundiced.  Neurological:     Mental Status: He is alert and oriented to person, place, and time.    Imaging: No recent pertinent imaging.  Labs:  CBC: Recent Labs    05/28/20 1122 08/21/20 1400 09/03/20 1252 11/16/20 0810  WBC 3.0* 3.5* 2.3* 2.9*  HGB 12.7* 12.0* 12.8* 12.4*  HCT 38.9* 37.6* 38.9* 37.4*  PLT 257 229 235 211    COAGS: Recent Labs    12/11/19 1540 12/11/19 2204 12/12/19 0607 01/09/20 0803 05/28/20 1122 05/31/20 1135 09/13/20 1407 10/12/20 0735 10/26/20 1325 11/16/20 0802  INR  --   --   --    < > 2.3*   < > 3.6* 1.6* 1.9* 3.1*  APTT 90* 109* 95*  --  36  --   --   --   --   --    < > = values in this interval not displayed.    BMP: Recent Labs    05/28/20 1122 08/21/20 1400 09/03/20 1252 11/16/20 0810  NA 139 138 136 141  K 4.7 4.3 4.0 4.2  CL 110 107 107 109  CO2 _0 GLUCOSE 111* 92 100* 98  BUN _1 CALCIUM 9.6 9.6 9.0 8.8*  CREATININE 1.04 1.08 0.95 1.17   GFRNONAA >60 >60 >60 >60    LIVER FUNCTION TESTS: Recent Labs    05/28/20 1122 08/21/20 1400 09/03/20 1252 11/16/20 0810  BILITOT 1.5* 1.0 1.1 0.8  AST 90* 39 152* 48*  ALT 78* 51* 134* 58*  ALKPHOS 65 59 71 78  PROT 7.1 6.4* 7.3 7.3  ALBUMIN 3.7 3.2* 3.7 3.7    Assessment and Plan: 65 year old non-cirrhotic male with history of portal thrombus and cavernous transformation status post TIPS creation and portal thrombectomy at outside facility in September 2021.  Failed attempt at TIPS recanalization in November 2021 by me.  He compensated well from a portal hypertension standpoint initially, but now has experienced increasing and persistent abdominal pain which is vague and difficult to determine etiology.  Endoscopic evaluation has been relatively unrevealing.     It is possible that his portal hypertension is causing his pain, but difficult to prove.  In the interim since last visit, he has had increased melena.  Some of this may be contributed to mildly elevated INR, however he was not technically supra-therapeutic.  After long discussion, Mr. Deridder wishes to proceed with attempted portal vein and TIPS recanalization.  We dicussed the potential risks, benefits, and technical aspects of this procedure including general anesthesia, transplenic and venous access, and potential use of advanced recanalization techniques including sharp and laser recanalization.  Additionally, I emphasized that his abdominal pain may not improve, and we can't guarantee long term patency of the new stent(s), however staying on anticoagulation will help.  He is amenable to proceed in hopes that his abdominal pain and bleeding will resolve.  Will plan for TIPS/portal venous recanalization with general anesthesia at [facility]  [place]. Plan to hold coumadin 3 days prior to procedure.  Plan for repeat CT Abodmen/pelvis BRTO protocol prior to the procedure day for planning purposes.  Electronically  Signed: Suzette Battiest 11/21/2020, 11:06 AM   I spent a total of 40 Minutes in face to face in clinical consultation, greater than 50% of which was counseling/coordinating care for cavernous transformation of the portal vein.

## 2020-11-23 ENCOUNTER — Other Ambulatory Visit: Payer: Self-pay | Admitting: Hematology and Oncology

## 2020-11-27 ENCOUNTER — Other Ambulatory Visit: Payer: Self-pay | Admitting: Hematology and Oncology

## 2020-12-05 ENCOUNTER — Emergency Department (HOSPITAL_COMMUNITY)
Admission: EM | Admit: 2020-12-05 | Discharge: 2020-12-05 | Disposition: A | Discharge status: Home / Self Care | Payer: BC Managed Care – PPO | Attending: Emergency Medicine | Admitting: Emergency Medicine

## 2020-12-05 ENCOUNTER — Other Ambulatory Visit: Payer: Self-pay

## 2020-12-05 ENCOUNTER — Emergency Department (HOSPITAL_COMMUNITY): Payer: BC Managed Care – PPO

## 2020-12-05 ENCOUNTER — Encounter (HOSPITAL_COMMUNITY): Payer: Self-pay | Admitting: *Deleted

## 2020-12-05 DIAGNOSIS — Z7984 Long term (current) use of oral hypoglycemic drugs: Secondary | ICD-10-CM | POA: Diagnosis not present

## 2020-12-05 DIAGNOSIS — I10 Essential (primary) hypertension: Secondary | ICD-10-CM | POA: Diagnosis not present

## 2020-12-05 DIAGNOSIS — Z8546 Personal history of malignant neoplasm of prostate: Secondary | ICD-10-CM | POA: Diagnosis not present

## 2020-12-05 DIAGNOSIS — R1013 Epigastric pain: Secondary | ICD-10-CM | POA: Diagnosis not present

## 2020-12-05 DIAGNOSIS — Z8616 Personal history of COVID-19: Secondary | ICD-10-CM | POA: Insufficient documentation

## 2020-12-05 DIAGNOSIS — Z87891 Personal history of nicotine dependence: Secondary | ICD-10-CM | POA: Diagnosis not present

## 2020-12-05 DIAGNOSIS — Z7901 Long term (current) use of anticoagulants: Secondary | ICD-10-CM

## 2020-12-05 LAB — URINALYSIS, ROUTINE W REFLEX MICROSCOPIC
Bilirubin Urine: NEGATIVE
Glucose, UA: NEGATIVE mg/dL
Hgb urine dipstick: NEGATIVE
Ketones, ur: NEGATIVE mg/dL
Leukocytes,Ua: NEGATIVE
Nitrite: NEGATIVE
Protein, ur: NEGATIVE mg/dL
Specific Gravity, Urine: 1.041 — ABNORMAL HIGH (ref 1.005–1.030)
pH: 7 (ref 5.0–8.0)

## 2020-12-05 LAB — CBC WITH DIFFERENTIAL/PLATELET
Abs Immature Granulocytes: 0.01 10*3/uL (ref 0.00–0.07)
Basophils Absolute: 0 10*3/uL (ref 0.0–0.1)
Basophils Relative: 1 %
Eosinophils Absolute: 0.1 10*3/uL (ref 0.0–0.5)
Eosinophils Relative: 3 %
HCT: 39.1 % (ref 39.0–52.0)
Hemoglobin: 12.6 g/dL — ABNORMAL LOW (ref 13.0–17.0)
Immature Granulocytes: 0 %
Lymphocytes Relative: 38 %
Lymphs Abs: 1.7 10*3/uL (ref 0.7–4.0)
MCH: 27 pg (ref 26.0–34.0)
MCHC: 32.2 g/dL (ref 30.0–36.0)
MCV: 83.9 fL (ref 80.0–100.0)
Monocytes Absolute: 0.8 10*3/uL (ref 0.1–1.0)
Monocytes Relative: 17 %
Neutro Abs: 1.8 10*3/uL (ref 1.7–7.7)
Neutrophils Relative %: 41 %
Platelets: 234 10*3/uL (ref 150–400)
RBC: 4.66 MIL/uL (ref 4.22–5.81)
RDW: 15.5 % (ref 11.5–15.5)
WBC: 4.4 10*3/uL (ref 4.0–10.5)
nRBC: 0 % (ref 0.0–0.2)

## 2020-12-05 LAB — COMPREHENSIVE METABOLIC PANEL
ALT: 56 U/L — ABNORMAL HIGH (ref 0–44)
AST: 39 U/L (ref 15–41)
Albumin: 3.5 g/dL (ref 3.5–5.0)
Alkaline Phosphatase: 60 U/L (ref 38–126)
Anion gap: 6 (ref 5–15)
BUN: 15 mg/dL (ref 8–23)
CO2: 28 mmol/L (ref 22–32)
Calcium: 9.4 mg/dL (ref 8.9–10.3)
Chloride: 105 mmol/L (ref 98–111)
Creatinine, Ser: 1.2 mg/dL (ref 0.61–1.24)
GFR, Estimated: >60 mL/min (ref >60)
Glucose, Bld: 95 mg/dL (ref 70–99)
Potassium: 4.4 mmol/L (ref 3.5–5.1)
Sodium: 139 mmol/L (ref 135–145)
Total Bilirubin: 1.1 mg/dL (ref 0.3–1.2)
Total Protein: 6.8 g/dL (ref 6.5–8.1)

## 2020-12-05 LAB — PROTIME-INR
INR: 2.9 — ABNORMAL HIGH (ref 0.8–1.2)
Prothrombin Time: 30.3 seconds — ABNORMAL HIGH (ref 11.4–15.2)

## 2020-12-05 LAB — TROPONIN I (HIGH SENSITIVITY)
Troponin I (High Sensitivity): <2 ng/L (ref <18)
Troponin I (High Sensitivity): <2 ng/L (ref <18)

## 2020-12-05 LAB — LIPASE, BLOOD: Lipase: 46 U/L (ref 11–51)

## 2020-12-05 MED ORDER — MORPHINE SULFATE (PF) 4 MG/ML IV SOLN
4.0000 mg | Freq: Once | INTRAVENOUS | Status: AC
  Administered 2020-12-05: 4 mg via INTRAVENOUS
  Filled 2020-12-05: qty 1

## 2020-12-05 MED ORDER — ALUM & MAG HYDROXIDE-SIMETH 200-200-20 MG/5ML PO SUSP
30.0000 mL | Freq: Once | ORAL | Status: AC
  Administered 2020-12-05: 30 mL via ORAL
  Filled 2020-12-05: qty 30

## 2020-12-05 MED ORDER — ONDANSETRON HCL 4 MG/2ML IJ SOLN
4.0000 mg | Freq: Once | INTRAMUSCULAR | Status: AC
  Administered 2020-12-05: 4 mg via INTRAVENOUS
  Filled 2020-12-05: qty 2

## 2020-12-05 MED ORDER — OMEPRAZOLE 40 MG PO CPDR: 40.0000 mg | DELAYED_RELEASE_CAPSULE | Freq: Every day | ORAL | 0 refills | Status: DC

## 2020-12-05 MED ORDER — IOHEXOL 350 MG/ML SOLN
80.0000 mL | Freq: Once | INTRAVENOUS | Status: AC | PRN
  Administered 2020-12-05: 80 mL via INTRAVENOUS

## 2020-12-05 MED ORDER — SUCRALFATE 1 G PO TABS
1.0000 g | ORAL_TABLET | Freq: Three times a day (TID) | ORAL | 0 refills | Status: DC
Start: 2020-12-05 — End: 2022-12-01

## 2020-12-05 MED ORDER — FAMOTIDINE IN NACL 20-0.9 MG/50ML-% IV SOLN
20.0000 mg | Freq: Once | INTRAVENOUS | Status: AC
  Administered 2020-12-05: 20 mg via INTRAVENOUS
  Filled 2020-12-05: qty 50

## 2020-12-05 NOTE — ED Notes (Signed)
ED Provider at bedside. 

## 2020-12-05 NOTE — ED Provider Notes (Signed)
Emergency Medicine Provider Triage Evaluation Note  Harry Lucero , a 65 y.o. male  was evaluated in triage.  Pt complains of epigastric pain.  The patient reports a sudden onset, severe epigastric pain that began 2 hours ago.  He characterizes the pain as constant, squeezing, and cramping.  Nonradiating.  The pain awoke him from sleep.  He has a known history of a portal vein thrombosis and is on Coumadin.  His Coumadin dosing was decreased over the last few weeks due to elevated INR.  He states that the pain feels similar to when he was diagnosed with a portal vein thrombosis.  He was recently treated for H. pylori, and completed treatment as prescribed.  He denies increased burping or belching, nausea, vomiting, diarrhea, shortness of breath, cough, fever, chills, numbness, weakness, diarrhea, constipation, or dysuria, melena, hematochezia.  No treatment prior to arrival.  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  He will only take Tylenol for pain.  Review of Systems  Positive: Abdominal pain Negative: Fever, chills, nausea, vomiting, diarrhea, melena, hematochezia, numbness, weakness, constipation, cough, shortness of breath, dysuria, belching  Physical Exam  There were no vitals taken for this visit. Gen:   Awake, no distress   Resp:  Normal effort  MSK:   Moves extremities without difficulty  Other:  Tender to palpation in the epigastric region.  No rebound or guarding.  Medical Decision Making  Medically screening exam initiated at 3:41 AM.  Appropriate orders placed.  Binyamin Nelis was informed that the remainder of the evaluation will be completed by another provider, this initial triage assessment does not replace that evaluation, and the importance of remaining in the ED until their evaluation is complete.  Labs have been ordered.  He will require further work-up evaluation in the emergency department.   Joline Maxcy A, PA-C 12/05/20 0354    Orpah Greek, MD 12/05/20 [phone]

## 2020-12-05 NOTE — ED Triage Notes (Signed)
Arrived by EMS for Epigastric pain x 2 hours. Hx of portal vein thrombus in August. Reports recent coumadin changes (lower dose). Denies NV

## 2020-12-05 NOTE — ED Provider Notes (Signed)
[facility] EMERGENCY DEPARTMENT Provider Note   CSN: [id number] Arrival date & time: 12/05/20  0341     History Chief Complaint  Patient presents with   Abdominal Pain    Harry Lucero is a 65 y.o. male.  Pt presents to the ED today with epigastric abdominal pain.  The pt has a hx of antiphospholipid antibody syndrome and developed a clot in his tips catheter.  Pt is on coumadin.  He has seen Dr. Serafina Royals for possible TIPS recannulization.  This was attempted last November and was not successful.  Pt said they plan to retry in the future.  Pt has also seen GI at [facility] and was treated for H. Pylori.  He just finished that treatment on Thursday, 10/6.  Pt is not on any chronic GI meds.  Pt is sure his pain is from his portal vein thrombosis.      Past Medical History:  Diagnosis Date   Antiphospholipid antibody syndrome (HCC)    Cough due to ACE inhibitor    clear mucous due to lisinopril   COVID 01/06-2020   asymptomatic   Headache    Hypertension    off lisinopril since 05-08-2020   Lupus anticoagulant positive    Portal vein thrombosis    Pre-diabetes    Prostate cancer [facility])    Wears glasses    for reading    Patient Active Problem List   Diagnosis Date Noted   Malignant neoplasm of prostate ([place]) 03/27/2020   Left upper quadrant pain 02/29/2020   Rib pain 02/29/2020   Diabetes ([place]) 02/22/2020   History of prostate cancer 02/22/2020   Pancreatic cyst 02/22/2020   Antiphospholipid antibody syndrome ([place]) 01/13/2020   VTE (venous thromboembolism) 12/11/2019   Elevated LFTs 12/11/2019   Portal vein thrombosis 12/05/2019   Bacteremia 11/02/2019   S/P percutaneous transluminal angioplasty (PTA) 10/31/2019   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 PACE   IR RADIOLOGIST EVAL & MGMT  01/03/2020   IR RADIOLOGIST EVAL & MGMT  04/18/2020   IR RADIOLOGIST EVAL & MGMT  10/23/2020   IR RADIOLOGIST EVAL & MGMT  11/21/2020   IR TIPS  11/02/2019   IR TIPS REVISION MOD SED  01/09/2020   PROSTATE BIOPSY     RADIOACTIVE SEED IMPLANT N/A 05/31/2020   Procedure: RADIOACTIVE SEED IMPLANT/BRACHYTHERAPY IMPLANT;  Surgeon: Robley Fries, MD;  Location: [place];  Service: Urology;  Laterality: N/A;  SEEDS IMPLANTED   RADIOLOGY WITH ANESTHESIA N/A 01/09/2020   Procedure: IR WITH ANESTHESIA TIPS REVISION;  Surgeon: Radiologist, Medication, MD;  Location: [place];  Service: Radiology;  Laterality: N/A;   TIPS PROCEDURE         Family History  Problem Relation Age of Onset   Prostate cancer Neg Hx    Colon cancer Neg Hx    Pancreatic cancer Neg Hx    Breast cancer Neg Hx     Social History   Tobacco Use   Smoking status: Former    Packs/day: 1.00    Years: 20.00    Pack  years: 20.00    Types: Cigarettes    Quit date: 02/24/1994    Years since quitting: 26.7   Smokeless tobacco: Never  Vaping Use   Vaping Use: Never used  Substance Use Topics   Alcohol use: No   Drug use: No    Comment: hx of marijuana and crack 25 yrs ago last used    Home Medications Prior to Admission medications   Medication Sig Start Date End Date Taking? Authorizing Provider  omeprazole (PRILOSEC) 40 MG capsule Take 1 capsule (40 mg total) by mouth daily. 12/05/20  Yes [name], MD  sucralfate (CARAFATE) 1 g tablet Take 1 tablet (1 g total) by mouth 4 (four) times daily -  with meals and at bedtime. 12/05/20  Yes [name], MD  Blood Glucose Monitoring Suppl (CONTOUR NEXT EZ) w/Device KIT See admin  instructions. 03/02/20   [provider]  CONTOUR NEXT TEST test strip daily. 03/01/20   [provider]  glipiZIDE (GLUCOTROL XL) 2.5 MG 24 hr tablet Take 2.5 mg by mouth daily. 02/22/20   [provider]  Microlet Lancets MISC daily. 03/01/20   [provider]  Multiple Vitamin (MULTIVITAMIN) tablet Take 1 tablet by mouth daily.    [provider]  pravastatin (PRAVACHOL) 20 MG tablet Take 20 mg by mouth daily. 02/22/20   [provider]  traMADol (ULTRAM) 50 MG tablet Take 1 tablet (50 mg total) by mouth every 6 (six) hours as needed. 05/31/20 05/31/21  Robley Fries, MD  Vitamin D, Ergocalciferol, (DRISDOL) 1.25 MG (50000 UNIT) CAPS capsule Take 50,000 Units by mouth once a week. 06/10/20   [provider]  warfarin (COUMADIN) 10 MG tablet Take 1 tablet (10 mg total) by mouth daily. Patient taking differently: Take 10 mg by mouth daily. Monday to Friday 10 mg 03/23/20   [name], MD  warfarin (COUMADIN) 5 MG tablet Takes 5 mg on saturday and sunday 10/01/20   [name], MD  ranitidine (ZANTAC) 150 MG tablet Take 1 tablet (150 mg total) by mouth 2 (two) times daily. Patient not taking: Reported on 12/11/2019 03/19/18 12/11/19  Muthersbaugh, Jarrett Soho, PA-C    Allergies    Lisinopril  Review of Systems   Review of Systems  Gastrointestinal:  Positive for abdominal pain.  All other systems reviewed and are negative.  Physical Exam Updated Vital Signs BP 134/81   Pulse 60   Temp 97.8 F (36.6 C) (Oral)   Resp 17   SpO2 97%   Physical Exam Vitals and nursing note reviewed.  Constitutional:      Appearance: He is well-developed.  HENT:     Head: Normocephalic and atraumatic.     Mouth/Throat:     Mouth: Mucous membranes are moist.     Pharynx: Oropharynx is clear.  Eyes:     Extraocular Movements: Extraocular movements intact.     Pupils: Pupils are equal, round, and reactive to light.  Cardiovascular:     Rate and  Rhythm: Normal rate and regular rhythm.     Heart sounds: Normal heart sounds.  Pulmonary:     Effort: Pulmonary effort is normal.     Breath sounds: Normal breath sounds.  Abdominal:     General: Abdomen is flat. Bowel sounds are normal.     Palpations: Abdomen is soft.     Tenderness: There is abdominal tenderness in the epigastric area.  Skin:    General: Skin is warm.     Capillary Refill:  Capillary refill takes less than 2 seconds.  Neurological:     General: No focal deficit present.     Mental Status: He is alert and oriented to person, place, and time.  Psychiatric:        Mood and Affect: Mood normal.        Behavior: Behavior normal.    ED Results / Procedures / Treatments   Labs (all labs ordered are listed, but only abnormal results are displayed) Labs Reviewed  CBC WITH DIFFERENTIAL/PLATELET - Abnormal; Notable for the following components:      Result Value   Hemoglobin 12.6 (*)    All other components within normal limits  PROTIME-INR - Abnormal; Notable for the following components:   Prothrombin Time 30.3 (*)    INR 2.9 (*)    All other components within normal limits  COMPREHENSIVE METABOLIC PANEL - Abnormal; Notable for the following components:   ALT 56 (*)    All other components within normal limits  URINALYSIS, ROUTINE W REFLEX MICROSCOPIC - Abnormal; Notable for the following components:   Specific Gravity, Urine 1.041 (*)    All other components within normal limits  LIPASE, BLOOD  TROPONIN I (HIGH SENSITIVITY)  TROPONIN I (HIGH SENSITIVITY)    EKG None  Radiology CT Angio Abd/Pel W and/or Wo Contrast  Result Date: 12/05/2020 CLINICAL DATA:  Acute abdominal pain. EXAM: CTA ABDOMEN AND PELVIS WITH CONTRAST TECHNIQUE: Multidetector CT imaging of the abdomen and pelvis was performed using the standard protocol during bolus administration of intravenous contrast. Multiplanar reconstructed images and MIPs were obtained and reviewed to evaluate the  vascular anatomy. CONTRAST:  6m OMNIPAQUE IOHEXOL 350 MG/ML SOLN COMPARISON:  08/21/2020 FINDINGS: VASCULAR Aorta: Normal caliber abdominal aorta. No dissection. No atherosclerotic calcification. Celiac: Slightly irregular appearance could suggest FMD. No atherosclerotic calcifications or stenosis. SMA: Normal. Renals: Normal IMA: Normal Inflow: Normal Proximal Outflow: Normal Veins: Chronic portal vein thrombosis with extensive cavernous transformation in the porta hepatis with a large tangle of vessels. There also perigastric collaterals. Review of the MIP images confirms the above findings. NON-VASCULAR Lower chest: The lung bases are clear of acute process. No pleural effusion or pulmonary lesions. The heart is normal in size. No pericardial effusion. The distal esophagus and aorta are unremarkable. Hepatobiliary: No hepatic lesions or intrahepatic biliary dilatation. No contrast is seen in the TIPS shunt consistent with chronic thrombosis/occlusion. The gallbladder is unremarkable. No common bile duct dilatation. Pancreas: No mass, inflammation or ductal dilatation. Spleen: Normal size. No focal lesions. Adrenals/Urinary Tract: The adrenal glands and kidneys are unremarkable. Thick wall bladder could be due to lack of distension or bladder outlet obstruction. Stomach/Bowel: The stomach, duodenum, small bowel and colon are grossly normal. No acute inflammatory process, mass lesions or obstructive findings. Stable colonic diverticulosis. The appendix is normal. Lymphatic: No abdominal or pelvic lymphadenopathy. Reproductive: Brachytherapy seeds are noted in the prostate gland. The seminal vesicles are unremarkable. Other: No pelvic mass or adenopathy. No free pelvic fluid collections. No inguinal mass or adenopathy. No abdominal wall hernia or subcutaneous lesions. Musculoskeletal: No significant bony findings. IMPRESSION: 1. Chronic portal vein thrombosis with extensive cavernous transformation in the porta  hepatis. 2. No contrast is seen in the TIPS shunt consistent with chronic thrombosis/occlusion. 3. Normal aorta and branch vessels. Could not exclude FMD involving the celiac axis. 4. Stable colonic diverticulosis without findings for acute diverticulitis. 5. Thick wall bladder could be due to lack of distension or bladder outlet obstruction. Electronically Signed  By: Marijo Sanes M.D.   On: 12/05/2020 07:05    Procedures Procedures   Medications Ordered in ED Medications  morphine 4 MG/ML injection 4 mg (has no administration in time range)  ondansetron (ZOFRAN) injection 4 mg (has no administration in time range)  famotidine (PEPCID) IVPB 20 mg premix (has no administration in time range)  iohexol (OMNIPAQUE) 350 MG/ML injection 80 mL (80 mLs Intravenous Contrast Given 12/05/20 0649)  alum & mag hydroxide-simeth (MAALOX/MYLANTA) 200-200-20 MG/5ML suspension 30 mL (30 mLs Oral Given 12/05/20 1112)    ED Course  I have reviewed the triage vital signs and the nursing notes.  Pertinent labs & imaging results that were available during my care of the patient were reviewed by me and considered in my medical decision making (see chart for details).    MDM Rules/Calculators/A&P                           Pt d/w Dr. Earleen Newport (IR).  He said as long as the CT scan was not showing anything new, pt can follow up as an outpatient.  He doubts pain is from his thrombosis because pt has good collaterals and ct is showing chronic findings.   Since pt has epigastric pain and was just treated for h.pylori, I suspect it is from his stomach.   Pt is encouraged to f/u with his GI doctor and with his pcp.   Pt is instructed to return if worse.   Final Clinical Impression(s) / ED Diagnoses Final diagnoses:  Epigastric pain  Anticoagulated on Coumadin    Rx / DC Orders ED Discharge Orders          Ordered    Consult to interventional radiologist (physician to physician consult) [phone]        Provider:  (Not yet assigned)   12/05/20 1212    omeprazole (PRILOSEC) 40 MG capsule  Daily        12/05/20 1322    sucralfate (CARAFATE) 1 g tablet  3 times daily with meals & bedtime        12/05/20 1322             [name], MD 12/05/20 1326

## 2020-12-06 ENCOUNTER — Telehealth: Payer: Self-pay | Admitting: *Deleted

## 2020-12-06 ENCOUNTER — Other Ambulatory Visit: Payer: Self-pay

## 2020-12-06 ENCOUNTER — Inpatient Hospital Stay: Payer: BC Managed Care – PPO | Attending: Hematology and Oncology | Admitting: Hematology and Oncology

## 2020-12-06 DIAGNOSIS — D6861 Antiphospholipid syndrome: Secondary | ICD-10-CM | POA: Insufficient documentation

## 2020-12-06 DIAGNOSIS — I81 Portal vein thrombosis: Secondary | ICD-10-CM | POA: Insufficient documentation

## 2020-12-06 DIAGNOSIS — Z7901 Long term (current) use of anticoagulants: Secondary | ICD-10-CM | POA: Diagnosis not present

## 2020-12-06 DIAGNOSIS — Z79899 Other long term (current) drug therapy: Secondary | ICD-10-CM | POA: Diagnosis not present

## 2020-12-06 DIAGNOSIS — C61 Malignant neoplasm of prostate: Secondary | ICD-10-CM | POA: Insufficient documentation

## 2020-12-06 NOTE — Progress Notes (Signed)
Patient Care Team: Simona Huh, NP as PCP - General (Nurse Practitioner) Cira Rue, RN as Oncology Nurse Navigator  DIAGNOSIS:  Encounter Diagnosis  Name Primary?   Antiphospholipid antibody syndrome ([place])     SUMMARY OF ONCOLOGIC HISTORY: Oncology History  Malignant neoplasm of prostate ([place])  01/30/2020 Cancer Staging   Staging form: Prostate, AJCC 8th Edition - Clinical stage from 01/30/2020: Stage IIB (cT1c, cN0, cM0, PSA: 10, Grade Group: 2) - Signed by Freeman Caldron, PA-C on 03/27/2020 Histopathologic type: Adenocarcinoma, NOS Stage prefix: Initial diagnosis Prostate specific antigen (PSA) range: 10 to 19 Gleason primary pattern: 3 Gleason secondary pattern: 4 Gleason score: 7 Histologic grading system: 5 grade system Number of biopsy cores examined: 12 Number of biopsy cores positive: 3 Location of positive needle core biopsies: Both sides   03/27/2020 Initial Diagnosis   Malignant neoplasm of prostate ([place])     CHIEF COMPLIANT: Persistent and severe abdominal pain  INTERVAL HISTORY: Harry Lucero is a 65 year old with above-mentioned history of portal vein thrombosis and a stent placement in [place] who has been on Coumadin for the antiphospholipid antibody syndrome.  He has had chronic mild abdominal pain but recently over the past couple of days his pain has gotten markedly worse and he went to the emergency room.  In the emergency room he had another CT of the abdomen and pelvis which revealed (persistent chronic thrombus involving the portal vein as well as the stent.  There were a bunch of blood vessels in the portal zone related to the obstruction.  He does not want to take any pain medication and he wants to find out what can be done to resolve his pain.    ALLERGIES:  is allergic to lisinopril.  MEDICATIONS:  Current Outpatient Medications  Medication Sig Dispense Refill   Blood Glucose Monitoring Suppl (CONTOUR NEXT EZ) w/Device KIT See admin  instructions.     CONTOUR NEXT TEST test strip daily.     glipiZIDE (GLUCOTROL XL) 2.5 MG 24 hr tablet Take 2.5 mg by mouth daily.     Microlet Lancets MISC daily.     Multiple Vitamin (MULTIVITAMIN) tablet Take 1 tablet by mouth daily.     omeprazole (PRILOSEC) 40 MG capsule Take 1 capsule (40 mg total) by mouth daily. 30 capsule 0   pravastatin (PRAVACHOL) 20 MG tablet Take 20 mg by mouth daily.     sucralfate (CARAFATE) 1 g tablet Take 1 tablet (1 g total) by mouth 4 (four) times daily -  with meals and at bedtime. 90 tablet 0   traMADol (ULTRAM) 50 MG tablet Take 1 tablet (50 mg total) by mouth every 6 (six) hours as needed. 20 tablet 0   Vitamin D, Ergocalciferol, (DRISDOL) 1.25 MG (50000 UNIT) CAPS capsule Take 50,000 Units by mouth once a week.     warfarin (COUMADIN) 10 MG tablet Take 1 tablet (10 mg total) by mouth daily. (Patient taking differently: Take 10 mg by mouth daily. Monday to Friday 10 mg) 90 tablet 3   warfarin (COUMADIN) 5 MG tablet Takes 5 mg on saturday and sunday 60 tablet 3   No current facility-administered medications for this visit.    PHYSICAL EXAMINATION: ECOG PERFORMANCE STATUS: 1 - Symptomatic but completely ambulatory  Vitals:   12/06/20 1324  BP: 120/76  Pulse: 99  Resp: 18  Temp: 97.7 F (36.5 C)  SpO2: 99%   Filed Weights   12/06/20 1324  Weight: 208 lb 6.4 oz (94.5 kg)  LABORATORY DATA:  I have reviewed the data as listed CMP Latest Ref Rng & Units 12/05/2020 11/16/2020 09/03/2020  Glucose 70 - 99 mg/dL 95 98 100(H)  BUN 8 - 23 mg/dL _0 Creatinine 0.61 - 1.24 mg/dL 1.20 1.17 0.95  Sodium 135 - 145 mmol/L 139 141 136  Potassium 3.5 - 5.1 mmol/L 4.4 4.2 4.0  Chloride 98 - 111 mmol/L 105 109 107  CO2 22 - 32 mmol/L _1 Calcium 8.9 - 10.3 mg/dL 9.4 8.8(L) 9.0  Total Protein 6.5 - 8.1 g/dL 6.8 7.3 7.3  Total Bilirubin 0.3 - 1.2 mg/dL 1.1 0.8 1.1  Alkaline Phos 38 - 126 U/L 60 78 71  AST 15 - 41 U/L 39 48(H) 152(H)  ALT 0  - 44 U/L 56(H) 58(H) 134(H)    Lab Results  Component Value Date   WBC 4.4 12/05/2020   HGB 12.6 (L) 12/05/2020   HCT 39.1 12/05/2020   MCV 83.9 12/05/2020   PLT 234 12/05/2020   NEUTROABS 1.8 12/05/2020    ASSESSMENT & PLAN:  Antiphospholipid antibody syndrome ([place]) 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 at [facility] when he presented with abdominal pain (positive for lupus anticoagulant) SMV thrombosis Splenic vein thrombosis   Current treatment: Switched to Coumadin 01/09/2020 (when he was diagnosed with positive lupus anticoagulant)   Hospitalization: 01/09/2020-01/10/2020 attempted TIPS recannulization procedure in IR, unsuccessful Continue with Coumadin therapy with close monitoring. Lab review: 01/27/2020: INR 5.7 02/23/2020: INR 1.8 03/22/2020: INR 1.6 (his INR is subtherapeutic because he is drinking green juice with lots of green leafy vegetables daily) Coumadin dose increased to 10 mg 05/04/2020: INR 2.7 (at therapeutic range) 10/26/20: INR 1.9 11/16/2020: INR 3.1 (Coumadin dose: 10 mg for 3 days and then 5 mg for 4 days)   Coumadin toxicity: Couple of episodes of blood per rectum. Prostate cancer: Status post radiation 06/2020  Epigastric abdominal pain: Went to the emergency room 12/05/2020: CT CAP: Chronic portal vein thrombosis with extensive cavernous transformation of porta hepatis.  TIPS shunt chronic thrombosis/occlusion.  I suspect that the cause of the pain is related to the portal vein occlusion.  He has an appointment to see Dr. Serafina Royals next week to discuss interventions for this. I reviewed the CT scan in great detail and pointed to the area of the abdomen where the portal vein may be causing his abdominal pain.  It appears that his pain is mostly related to this obstruction. He does not have any relief when being treated with antacids or gastritis medications.  Therefore we can conclude that his pain is not related to the stomach but rather his  portal vein occlusion.   No orders of the defined types were placed in this encounter.  The patient has a good understanding of the overall plan. he agrees with it. he will call with any problems that may develop before the next visit here. Total time spent: 30 mins including face to face time and time spent for planning, charting and co-ordination of care   Harriette Ohara, MD 12/06/20

## 2020-12-06 NOTE — Telephone Encounter (Signed)
Received call from pt and pt spouse with complaint of severe epigastric pain.  Pt seen in ED yesterday and discharged home.  Pt requesting hospital f/u with MD to discuss pain and interventions.  Appt scheduled.

## 2020-12-06 NOTE — Assessment & Plan Note (Signed)
Portal vein thrombosisdiagnosed September [facility] at [facility] when he presented with abdominal pain(positive for lupus anticoagulant) SMV thrombosis Splenic vein thrombosis  Current treatment:Switched to Coumadin 01/09/2020 (when he was diagnosed with positive lupus anticoagulant)  Hospitalization:01/09/2020-01/10/2020 attemptedTIPSrecannulization procedure in IR, unsuccessful Continue with Coumadin therapy with close monitoring. Lab review: 01/27/2020: INR 5.7 02/23/2020: INR 1.8 03/22/2020: INR 1.6(his INR is subtherapeutic because he is drinking green juice with lots of green leafy vegetables daily)Coumadin dose increased to 10 mg 05/04/2020: INR 2.7 (at therapeutic range) 10/26/20: INR 1.9 11/16/2020: INR 3.1 (Coumadin dose: 10 mg for 3 days and then 5 mg for 4 days) Recheck labs in 1 month.  Coumadin toxicity: Couple of episodes of blood per rectum. Prostate cancer: Status post radiation 06/2020  Epigastric abdominal pain: Went to the emergency room 12/05/2020: CT CAP: Chronic portal vein thrombosis with extensive cavernous transformation of porta hepatis.  TIPS shunt chronic thrombosis/occlusion  Leukopenia: ANC previously 1.3

## 2020-12-07 ENCOUNTER — Other Ambulatory Visit (HOSPITAL_COMMUNITY): Payer: Self-pay | Admitting: Interventional Radiology

## 2020-12-07 ENCOUNTER — Telehealth (HOSPITAL_COMMUNITY): Payer: Self-pay | Admitting: Radiology

## 2020-12-07 DIAGNOSIS — R188 Other ascites: Secondary | ICD-10-CM

## 2020-12-07 NOTE — Telephone Encounter (Signed)
Called pt to schedule his TIPS with Dr. Serafina Royals. Left VM. JM

## 2020-12-11 ENCOUNTER — Telehealth (HOSPITAL_COMMUNITY): Payer: Self-pay

## 2020-12-11 NOTE — Telephone Encounter (Signed)
Called to schedule TIPS for this Friday. Pt unable to do. The next time Dr. Serafina Royals is here is 10/27 but not sure if we can get anesthesia for this day. Will check on this and reach back out to patient. Pt agreed with this plan. AW

## 2020-12-11 NOTE — Telephone Encounter (Signed)
Called to schedule TIPS, no answer, left vm. AW

## 2020-12-20 NOTE — Progress Notes (Signed)
Surgical Instructions    Your procedure is scheduled on November 1st, 2022.   Report to [facility] Main Entrance "A" at 09:15 A.M., then check in with the Admitting office.  Call this number if you have problems the morning of surgery:  [phone]   If you have any questions prior to your surgery date call [phone]: Open Monday-Friday 8am-4pm    Remember:  Do not eat or drink after midnight the night before your surgery     Take these medicines the morning of surgery with A SIP OF WATER:  pravastatin (PRAVACHOL)   If needed:   traMADol (ULTRAM) albuterol (VENTOLIN HFA) - please, bring the inhaler with you the day of surgery fluticasone (FLONASE)   Follow your surgeon's instructions on when to stop Warfarin.  If no instructions were given by your surgeon then you will need to call the office to get those instructions.     As of today, STOP taking any Aspirin (unless otherwise instructed by your surgeon) Aleve, Naproxen, Ibuprofen, Motrin, Advil, Goody's, BC's, all herbal medications, fish oil, and all vitamins.    WHAT DO I DO ABOUT MY DIABETES MEDICATION?   Do not glipiZIDE (GLUCOTROL XL) the night before surgery (if you are taking this medicine at night) or the morning of surgery (if you are taking this medicine in the morning).   HOW TO MANAGE YOUR DIABETES BEFORE AND AFTER SURGERY  Why is it important to control my blood sugar before and after surgery? Improving blood sugar levels before and after surgery helps healing and can limit problems. A way of improving blood sugar control is eating a healthy diet by:  Eating less sugar and carbohydrates  Increasing activity/exercise  Talking with your doctor about reaching your blood sugar goals High blood sugars (greater than 180 mg/dL) can raise your risk of infections and slow your recovery, so you will need to focus on controlling your diabetes during the weeks before surgery. Make sure that the doctor who takes  care of your diabetes knows about your planned surgery including the date and location.  How do I manage my blood sugar before surgery? Check your blood sugar at least 4 times a day, starting 2 days before surgery, to make sure that the level is not too high or low.  Check your blood sugar the morning of your surgery when you wake up and every 2 hours until you get to the Short Stay unit.  If your blood sugar is less than 70 mg/dL, you will need to treat for low blood sugar: Do not take insulin. Treat a low blood sugar (less than 70 mg/dL) with  cup of clear juice (cranberry or apple), 4 glucose tablets, OR glucose gel. Recheck blood sugar in 15 minutes after treatment (to make sure it is greater than 70 mg/dL). If your blood sugar is not greater than 70 mg/dL on recheck, call [phone] for further instructions. Report your blood sugar to the short stay nurse when you get to Short Stay.  If you are admitted to the hospital after surgery: Your blood sugar will be checked by the staff and you will probably be given insulin after surgery (instead of oral diabetes medicines) to make sure you have good blood sugar levels. The goal for blood sugar control after surgery is 80-180 mg/dL.     After your COVID test   You are not required to quarantine however you are required to wear a well-fitting mask when you are out and around  people not in your household.  If your mask becomes wet or soiled, replace with a new one.  Wash your hands often with soap and water for 20 seconds or clean your hands with an alcohol-based hand sanitizer that contains at least 60% alcohol.  Do not share personal items.  Notify your provider: if you are in close contact with someone who has COVID  or if you develop a fever of 100.4 or greater, sneezing, cough, sore throat, shortness of breath or body aches.             Do not wear jewelry or makeup Do not wear lotions, powders, perfumes/colognes, or  deodorant. Do not shave 48 hours prior to surgery.  Men may shave face and neck. Do not bring valuables to the hospital. DO Not wear nail polish, gel polish, artificial nails, or any other type of covering on natural nails including finger and toenails. If patients have artificial nails, gel coating, etc. that need to be removed by a nail salon, please have this removed prior to surgery or surgery may need to be canceled/delayed if the surgeon/ anesthesia feels like the patient is unable to be adequately monitored.             [facility] is not responsible for any belongings or valuables.  Do NOT Smoke (Tobacco/Vaping)  24 hours prior to your procedure  If you use a CPAP at night, you may bring your mask for your overnight stay.   Contacts, glasses, hearing aids, dentures or partials may not be worn into surgery, please bring cases for these belongings   For patients admitted to the hospital, discharge time will be determined by your treatment team.   Patients discharged the day of surgery will not be allowed to drive home, and someone needs to stay with them for 24 hours.  NO VISITORS WILL BE ALLOWED IN PRE-OP WHERE PATIENTS ARE PREPPED FOR SURGERY.  ONLY 1 SUPPORT PERSON MAY BE PRESENT IN THE WAITING ROOM WHILE YOU ARE IN SURGERY.  IF YOU ARE TO BE ADMITTED, ONCE YOU ARE IN YOUR ROOM YOU WILL BE ALLOWED TWO (2) VISITORS. 1 (ONE) VISITOR MAY STAY OVERNIGHT BUT MUST ARRIVE TO THE ROOM BY 8pm.  Minor children may have two parents present. Special consideration for safety and communication needs will be reviewed on a case by case basis.  Special instructions:    Oral Hygiene is also important to reduce your risk of infection.  Remember - BRUSH YOUR TEETH THE MORNING OF SURGERY WITH YOUR REGULAR TOOTHPASTE   - Preparing For Surgery  Before surgery, you can play an important role. Because skin is not sterile, your skin needs to be as free of germs as possible. You can reduce the  number of germs on your skin by washing with CHG (chlorahexidine gluconate) Soap before surgery.  CHG is an antiseptic cleaner which kills germs and bonds with the skin to continue killing germs even after washing.     Please do not use if you have an allergy to CHG or antibacterial soaps. If your skin becomes reddened/irritated stop using the CHG.  Do not shave (including legs and underarms) for at least 48 hours prior to first CHG shower. It is OK to shave your face.  Please follow these instructions carefully.     Shower the NIGHT BEFORE SURGERY and the MORNING OF SURGERY with CHG Soap.   If you chose to wash your hair, wash your hair first as usual  with your normal shampoo. After you shampoo, rinse your hair and body thoroughly to remove the shampoo.  Then [employer] and genitals (private parts) with your normal soap and rinse thoroughly to remove soap.  After that Use CHG Soap as you would any other liquid soap. You can apply CHG directly to the skin and wash gently with a scrungie or a clean washcloth.   Apply the CHG Soap to your body ONLY FROM THE NECK DOWN.  Do not use on open wounds or open sores. Avoid contact with your eyes, ears, mouth and genitals (private parts). Wash Face and genitals (private parts)  with your normal soap.   Wash thoroughly, paying special attention to the area where your surgery will be performed.  Thoroughly rinse your body with warm water from the neck down.  DO NOT shower/wash with your normal soap after using and rinsing off the CHG Soap.  Pat yourself dry with a CLEAN TOWEL.  Wear CLEAN PAJAMAS to bed the night before surgery  Place CLEAN SHEETS on your bed the night before your surgery  DO NOT SLEEP WITH PETS.   Day of Surgery:  Take a shower with CHG soap. Wear Clean/Comfortable clothing the morning of surgery Do not apply any deodorants/lotions.   Remember to brush your teeth WITH YOUR REGULAR TOOTHPASTE.   Please read over the following  fact sheets that you were given.

## 2020-12-21 ENCOUNTER — Encounter (HOSPITAL_COMMUNITY): Payer: Self-pay

## 2020-12-21 ENCOUNTER — Encounter (HOSPITAL_COMMUNITY)
Admission: RE | Admit: 2020-12-21 | Discharge: 2020-12-21 | Disposition: A | Discharge status: Home / Self Care | Payer: BC Managed Care – PPO | Source: Ambulatory Visit | Attending: Interventional Radiology | Admitting: Interventional Radiology

## 2020-12-21 ENCOUNTER — Inpatient Hospital Stay: Payer: BC Managed Care – PPO

## 2020-12-21 ENCOUNTER — Other Ambulatory Visit: Payer: Self-pay

## 2020-12-21 VITALS — BP 145/89 | HR 67 | Temp 98.0°F | Resp 18 | Ht 68.0 in | Wt 202.8 lb

## 2020-12-21 DIAGNOSIS — Z20822 Contact with and (suspected) exposure to covid-19: Secondary | ICD-10-CM | POA: Insufficient documentation

## 2020-12-21 DIAGNOSIS — Z01818 Encounter for other preprocedural examination: Secondary | ICD-10-CM

## 2020-12-21 DIAGNOSIS — Z794 Long term (current) use of insulin: Secondary | ICD-10-CM | POA: Insufficient documentation

## 2020-12-21 DIAGNOSIS — E119 Type 2 diabetes mellitus without complications: Secondary | ICD-10-CM | POA: Insufficient documentation

## 2020-12-21 DIAGNOSIS — Z01812 Encounter for preprocedural laboratory examination: Secondary | ICD-10-CM | POA: Insufficient documentation

## 2020-12-21 LAB — TYPE AND SCREEN
ABO/RH(D): B POS
Antibody Screen: NEGATIVE

## 2020-12-21 LAB — HEMOGLOBIN A1C
Hgb A1c MFr Bld: 6.2 % — ABNORMAL HIGH (ref 4.8–5.6)
Mean Plasma Glucose: 131.24 mg/dL

## 2020-12-21 LAB — SARS CORONAVIRUS 2 (TAT 6-24 HRS): SARS Coronavirus 2: NEGATIVE

## 2020-12-21 LAB — GLUCOSE, CAPILLARY: Glucose-Capillary: 111 mg/dL — ABNORMAL HIGH (ref 70–99)

## 2020-12-21 NOTE — Progress Notes (Signed)
PCP - Simona Huh, NP Cardiologist - denies  Chest x-ray - 08/21/20 EKG - 08/22/20 Stress Test - denies ECHO - Pt states that he has had one in the past but cannot verify where or when. Pt reports in it was normal and no f/u needed. Cardiac Cath - denies  Sleep Study - denies CPAP - denies  Fasting Blood Sugar - 97-116 Checks Blood Sugar 2 times a day CBG at PAT: 111  Blood Thinner Instructions: Warfarin, LD 12/20/20 Aspirin Instructions: n/a  COVID TEST- 12/21/20-done in PAT  Anesthesia review: No  Patient denies shortness of breath, fever, cough and chest pain at PAT appointment   All instructions explained to the patient, with a verbal understanding of the material. Patient agrees to go over the instructions while at home for a better understanding. Patient also instructed to self quarantine after being tested for COVID-19. The opportunity to ask questions was provided.

## 2020-12-24 ENCOUNTER — Other Ambulatory Visit: Payer: Self-pay | Admitting: Radiology

## 2020-12-25 ENCOUNTER — Inpatient Hospital Stay (HOSPITAL_COMMUNITY)
Admission: RE | Admit: 2020-12-25 | Discharge: 2020-12-26 | DRG: 270 | Disposition: A | Discharge status: Home / Self Care | Payer: BC Managed Care – PPO | Attending: Interventional Radiology | Admitting: Interventional Radiology

## 2020-12-25 ENCOUNTER — Encounter (HOSPITAL_COMMUNITY)
Admission: RE | Disposition: A | Discharge status: Home / Self Care | Payer: Self-pay | Source: Home / Self Care | Attending: Interventional Radiology

## 2020-12-25 ENCOUNTER — Encounter (HOSPITAL_COMMUNITY): Payer: Self-pay | Admitting: Interventional Radiology

## 2020-12-25 ENCOUNTER — Inpatient Hospital Stay (HOSPITAL_COMMUNITY): Payer: BC Managed Care – PPO | Admitting: Certified Registered Nurse Anesthetist

## 2020-12-25 ENCOUNTER — Inpatient Hospital Stay (HOSPITAL_COMMUNITY)
Admission: RE | Admit: 2020-12-25 | Discharge: 2020-12-25 | Disposition: A | Discharge status: Home / Self Care | Payer: BC Managed Care – PPO | Source: Ambulatory Visit | Attending: Interventional Radiology | Admitting: Interventional Radiology

## 2020-12-25 DIAGNOSIS — R188 Other ascites: Secondary | ICD-10-CM

## 2020-12-25 DIAGNOSIS — Z8616 Personal history of COVID-19: Secondary | ICD-10-CM

## 2020-12-25 DIAGNOSIS — R7303 Prediabetes: Secondary | ICD-10-CM | POA: Diagnosis present

## 2020-12-25 DIAGNOSIS — D6861 Antiphospholipid syndrome: Secondary | ICD-10-CM | POA: Diagnosis present

## 2020-12-25 DIAGNOSIS — I10 Essential (primary) hypertension: Secondary | ICD-10-CM | POA: Diagnosis present

## 2020-12-25 DIAGNOSIS — Y838 Other surgical procedures as the cause of abnormal reaction of the patient, or of later complication, without mention of misadventure at the time of the procedure: Secondary | ICD-10-CM | POA: Diagnosis present

## 2020-12-25 DIAGNOSIS — Z8546 Personal history of malignant neoplasm of prostate: Secondary | ICD-10-CM | POA: Diagnosis not present

## 2020-12-25 DIAGNOSIS — Z7901 Long term (current) use of anticoagulants: Secondary | ICD-10-CM | POA: Diagnosis not present

## 2020-12-25 DIAGNOSIS — Z87891 Personal history of nicotine dependence: Secondary | ICD-10-CM | POA: Diagnosis not present

## 2020-12-25 DIAGNOSIS — T82898A Other specified complication of vascular prosthetic devices, implants and grafts, initial encounter: Secondary | ICD-10-CM | POA: Diagnosis present

## 2020-12-25 DIAGNOSIS — Z7984 Long term (current) use of oral hypoglycemic drugs: Secondary | ICD-10-CM | POA: Diagnosis not present

## 2020-12-25 DIAGNOSIS — I81 Portal vein thrombosis: Secondary | ICD-10-CM | POA: Diagnosis present

## 2020-12-25 HISTORY — PX: RADIOLOGY WITH ANESTHESIA: SHX6223

## 2020-12-25 HISTORY — PX: IR TRANSCATH PLC STENT EA ADD ART NOT LE CV CAR VERT CAR: IMG5444

## 2020-12-25 HISTORY — PX: IR TRANSCATH PLC STENT 1ST ART NOT LE CV CAR VERT CAR: IMG5443

## 2020-12-25 HISTORY — PX: IR TRANSHEPATIC PORTOGRAM W HEMO: IMG690

## 2020-12-25 HISTORY — PX: IR TRANSCATH PLC STENT  INITIAL VEIN  INC ANGIOPLASTY: IMG5445

## 2020-12-25 HISTORY — PX: IR TIPS REVISION MOD SED: IMG2296

## 2020-12-25 HISTORY — PX: IR US GUIDE VASC ACCESS LEFT: IMG2389

## 2020-12-25 HISTORY — PX: IR TIPS: IMG2295

## 2020-12-25 HISTORY — PX: IR PTA VENOUS EXCEPT DIALYSIS CIRCUIT: IMG6126

## 2020-12-25 LAB — PROTIME-INR
INR: 1.1 (ref 0.8–1.2)
Prothrombin Time: 14.5 seconds (ref 11.4–15.2)

## 2020-12-25 LAB — GLUCOSE, CAPILLARY
Glucose-Capillary: 116 mg/dL — ABNORMAL HIGH (ref 70–99)
Glucose-Capillary: 124 mg/dL — ABNORMAL HIGH (ref 70–99)

## 2020-12-25 LAB — APTT: aPTT: 29 seconds (ref 24–36)

## 2020-12-25 SURGERY — IR WITH ANESTHESIA
Anesthesia: General

## 2020-12-25 MED ORDER — WARFARIN SODIUM 5 MG PO TABS: 10.0000 mg | ORAL_TABLET | ORAL | Status: DC

## 2020-12-25 MED ORDER — SODIUM CHLORIDE 0.9 % IV SOLN: INTRAVENOUS | Status: DC

## 2020-12-25 MED ORDER — SUGAMMADEX SODIUM 200 MG/2ML IV SOLN
INTRAVENOUS | Status: DC | PRN
  Administered 2020-12-25: 400 mg via INTRAVENOUS

## 2020-12-25 MED ORDER — FENTANYL CITRATE (PF) 100 MCG/2ML IJ SOLN
INTRAMUSCULAR | Status: AC
  Filled 2020-12-25: qty 2

## 2020-12-25 MED ORDER — DEXAMETHASONE SODIUM PHOSPHATE 10 MG/ML IJ SOLN
INTRAMUSCULAR | Status: DC | PRN
  Administered 2020-12-25: 5 mg via INTRAVENOUS

## 2020-12-25 MED ORDER — ACETAMINOPHEN 500 MG PO TABS
1000.0000 mg | ORAL_TABLET | Freq: Four times a day (QID) | ORAL | Status: DC | PRN
  Administered 2020-12-26: 1000 mg via ORAL
  Filled 2020-12-25: qty 2

## 2020-12-25 MED ORDER — ONDANSETRON HCL 4 MG/2ML IJ SOLN: 4.0000 mg | Freq: Once | INTRAMUSCULAR | Status: DC | PRN

## 2020-12-25 MED ORDER — AMISULPRIDE (ANTIEMETIC) 5 MG/2ML IV SOLN: 10.0000 mg | Freq: Once | INTRAVENOUS | Status: DC | PRN

## 2020-12-25 MED ORDER — LIDOCAINE HCL (CARDIAC) PF 100 MG/5ML IV SOSY
PREFILLED_SYRINGE | INTRAVENOUS | Status: DC | PRN
  Administered 2020-12-25: 60 mg via INTRATRACHEAL

## 2020-12-25 MED ORDER — IOHEXOL 300 MG/ML  SOLN: 100.0000 mL | Freq: Once | INTRAMUSCULAR | Status: DC | PRN

## 2020-12-25 MED ORDER — FENTANYL CITRATE (PF) 100 MCG/2ML IJ SOLN
INTRAMUSCULAR | Status: DC | PRN
  Administered 2020-12-25: 100 ug via INTRAVENOUS
  Administered 2020-12-25 (×6): 50 ug via INTRAVENOUS

## 2020-12-25 MED ORDER — ENOXAPARIN SODIUM 100 MG/ML IJ SOSY
1.0000 mg/kg | PREFILLED_SYRINGE | Freq: Two times a day (BID) | INTRAMUSCULAR | Status: DC
  Administered 2020-12-26: 90 mg via SUBCUTANEOUS
  Filled 2020-12-25 (×2): qty 0.9

## 2020-12-25 MED ORDER — CHLORHEXIDINE GLUCONATE 0.12 % MT SOLN
15.0000 mL | Freq: Once | OROMUCOSAL | Status: AC
  Administered 2020-12-25: 15 mL via OROMUCOSAL
  Filled 2020-12-25: qty 15

## 2020-12-25 MED ORDER — INSULIN ASPART 100 UNIT/ML IJ SOLN: 0.0000 [IU] | Freq: Every day | INTRAMUSCULAR | Status: DC

## 2020-12-25 MED ORDER — OXYCODONE HCL 5 MG PO TABS
10.0000 mg | ORAL_TABLET | ORAL | Status: DC | PRN
  Filled 2020-12-25: qty 2

## 2020-12-25 MED ORDER — ENOXAPARIN SODIUM 100 MG/ML IJ SOSY
1.0000 mg/kg | PREFILLED_SYRINGE | Freq: Once | INTRAMUSCULAR | Status: AC
  Administered 2020-12-25: 90 mg via SUBCUTANEOUS
  Filled 2020-12-25: qty 1

## 2020-12-25 MED ORDER — WARFARIN - PHYSICIAN DOSING INPATIENT: Freq: Every day | Status: DC

## 2020-12-25 MED ORDER — ALBUTEROL SULFATE (2.5 MG/3ML) 0.083% IN NEBU: 3.0000 mL | INHALATION_SOLUTION | Freq: Two times a day (BID) | RESPIRATORY_TRACT | Status: DC | PRN

## 2020-12-25 MED ORDER — SODIUM CHLORIDE 0.9 % IV SOLN
2.0000 g | INTRAVENOUS | Status: AC
  Administered 2020-12-25: 2 g via INTRAVENOUS
  Filled 2020-12-25 (×2): qty 20

## 2020-12-25 MED ORDER — FENTANYL CITRATE (PF) 100 MCG/2ML IJ SOLN: 25.0000 ug | INTRAMUSCULAR | Status: DC | PRN

## 2020-12-25 MED ORDER — ROCURONIUM BROMIDE 100 MG/10ML IV SOLN
INTRAVENOUS | Status: DC | PRN
  Administered 2020-12-25: 20 mg via INTRAVENOUS
  Administered 2020-12-25: 60 mg via INTRAVENOUS
  Administered 2020-12-25 (×5): 20 mg via INTRAVENOUS

## 2020-12-25 MED ORDER — ONDANSETRON HCL 4 MG/2ML IJ SOLN
INTRAMUSCULAR | Status: DC | PRN
  Administered 2020-12-25: 4 mg via INTRAVENOUS

## 2020-12-25 MED ORDER — PHENYLEPHRINE HCL-NACL 20-0.9 MG/250ML-% IV SOLN
INTRAVENOUS | Status: DC | PRN
  Administered 2020-12-25: 25 ug/min via INTRAVENOUS

## 2020-12-25 MED ORDER — LACTATED RINGERS IV SOLN: INTRAVENOUS | Status: DC | PRN

## 2020-12-25 MED ORDER — WARFARIN SODIUM 5 MG PO TABS
5.0000 mg | ORAL_TABLET | ORAL | Status: DC
  Filled 2020-12-25: qty 1

## 2020-12-25 MED ORDER — LACTATED RINGERS IV SOLN: INTRAVENOUS | Status: DC

## 2020-12-25 MED ORDER — IOHEXOL 300 MG/ML  SOLN
100.0000 mL | Freq: Once | INTRAMUSCULAR | Status: AC | PRN
  Administered 2020-12-25: 80 mL via INTRAVENOUS

## 2020-12-25 MED ORDER — OXYCODONE HCL 5 MG PO TABS: 5.0000 mg | ORAL_TABLET | ORAL | Status: DC | PRN

## 2020-12-25 MED ORDER — WARFARIN SODIUM 5 MG PO TABS
10.0000 mg | ORAL_TABLET | Freq: Every day | ORAL | Status: DC
  Administered 2020-12-25: 10 mg via ORAL

## 2020-12-25 MED ORDER — PHENYLEPHRINE HCL (PRESSORS) 10 MG/ML IV SOLN
INTRAVENOUS | Status: DC | PRN
  Administered 2020-12-25: 80 ug via INTRAVENOUS

## 2020-12-25 MED ORDER — LIDOCAINE HCL 1 % IJ SOLN
INTRAMUSCULAR | Status: AC
  Filled 2020-12-25: qty 20

## 2020-12-25 MED ORDER — PROPOFOL 10 MG/ML IV BOLUS
INTRAVENOUS | Status: DC | PRN
  Administered 2020-12-25: 200 mg via INTRAVENOUS

## 2020-12-25 MED ORDER — INSULIN ASPART 100 UNIT/ML IJ SOLN: 0.0000 [IU] | Freq: Three times a day (TID) | INTRAMUSCULAR | Status: DC

## 2020-12-25 MED ORDER — ADULT MULTIVITAMIN W/MINERALS CH
1.0000 | ORAL_TABLET | Freq: Every day | ORAL | Status: DC
  Administered 2020-12-26: 1 via ORAL
  Filled 2020-12-25: qty 1

## 2020-12-25 MED ORDER — ORAL CARE MOUTH RINSE: 15.0000 mL | Freq: Once | OROMUCOSAL | Status: AC

## 2020-12-25 NOTE — H&P (Deleted)
  The note originally documented on this encounter has been moved the the encounter in which it belongs.  

## 2020-12-25 NOTE — Progress Notes (Signed)
Patient transported to PACU with Josh-CRNA. Anderson Malta RN to receive patient, bedside report given.

## 2020-12-25 NOTE — H&P (Signed)
Chief Complaint: Patient was seen in consultation today for portal vein thrombosis   Referring Physician(s): Suttle,Dylan J  Supervising Physician: Ruthann Cancer  Patient Status: [facility] - Out-pt  History of Present Illness: Harry Lucero is a 65 y.o. male with a medical history significant for portal vein thrombosis s/p TIPS creation with portal and mesenteric vein thrombectomy, attempted balloon maceration of the portal vein and SMV thrombosis 11/22/19 at [facility] in [place]. A few days after this he underwent transjugular lysis of the portal and mesenteric veins. He was discharged home on Eliquis.  He was admitted to [facility] 12/11/19 with worsening abdominal pain and imaging was positive for TIPS occlusion. Hematology was consulted and his work up was notable as positive for antiphospholipid antibody. He was started on lovenox and coumadin and Eliquis was discontinued. While inpatient IR was consulted and he was approved for outpatient TIPS recanalization. This was attempted 01/09/20 and there was an inability to cannulate the central aspect of the TIPS and unsuccessful cannulation of the splenic vein via transplenic approach. He was admitted for overnight observation and discharged home with continued outpatient IR follow up and coumadin.   Of note, he was diagnosed with prostate cancer in February 2022 and is s/p radioactive seed implantation 05/31/20.  He followed up with  Dr. Serafina Royals 10/23/20 and 11/21/20 with complaints of increasing/persistent abdominal pain and melena. After much discussion, the patient agreed to proceed with the recommended re-attempt at TIPS recanalization with advanced techniques (transplenic access, laser atherectomy of stent, etc) under general anesthesia.   Past Medical History:  Diagnosis Date   Antiphospholipid antibody syndrome (HCC)    Cough due to ACE inhibitor    clear mucous due to lisinopril   COVID 01/06-2020   asymptomatic   Headache     Hypertension    off lisinopril since 05-08-2020   Lupus anticoagulant positive    Portal vein thrombosis    Pre-diabetes    Prostate cancer ([place])    Wears glasses    for reading    Past Surgical History:  Procedure Laterality Date   CYSTOSCOPY N/A 05/31/2020   Procedure: CYSTOSCOPY FLEXIBLE;  Surgeon: Robley Fries, MD;  Location: [facility];  Service: Urology;  Laterality: N/A;  NO SEEDS FOUND BY DR. PACE   IR RADIOLOGIST EVAL & MGMT  01/03/2020   IR RADIOLOGIST EVAL & MGMT  04/18/2020   IR RADIOLOGIST EVAL & MGMT  10/23/2020   IR RADIOLOGIST EVAL & MGMT  11/21/2020   IR TIPS  11/02/2019   IR TIPS REVISION MOD SED  01/09/2020   PROSTATE BIOPSY     RADIOACTIVE SEED IMPLANT N/A 05/31/2020   Procedure: RADIOACTIVE SEED IMPLANT/BRACHYTHERAPY IMPLANT;  Surgeon: Robley Fries, MD;  Location: [facility];  Service: Urology;  Laterality: N/A;  SEEDS IMPLANTED   RADIOLOGY WITH ANESTHESIA N/A 01/09/2020   Procedure: IR WITH ANESTHESIA TIPS REVISION;  Surgeon: Radiologist, Medication, MD;  Location: [facility];  Service: Radiology;  Laterality: N/A;   TIPS PROCEDURE      Allergies: Lisinopril  Medications: Prior to Admission medications   Medication Sig Start Date End Date Taking? Authorizing Provider  albuterol (VENTOLIN HFA) 108 (90 Base) MCG/ACT inhaler Inhale 2 puffs into the lungs 2 (two) times daily as needed for shortness of breath. 12/14/20   [provider]  Blood Glucose Monitoring Suppl (CONTOUR NEXT EZ) w/Device KIT See admin instructions. 03/02/20   [provider]  CONTOUR NEXT TEST test strip daily. 03/01/20  [provider]  fluticasone (FLONASE) 50 MCG/ACT nasal spray Place 1 spray into both nostrils daily as needed for congestion. 09/30/20   [provider]  glipiZIDE (GLUCOTROL XL) 2.5 MG 24 hr tablet Take 2.5 mg by mouth daily. 02/22/20   [provider]  latanoprost (XALATAN) 0.005 % ophthalmic solution  Place 1 drop into both eyes daily in the afternoon. 1500 12/10/20   [provider]  Microlet Lancets MISC daily. 03/01/20   [provider]  Multiple Vitamin (MULTIVITAMIN) tablet Take 1 tablet by mouth daily.    [provider]  omeprazole (PRILOSEC) 40 MG capsule Take 1 capsule (40 mg total) by mouth daily. Patient not taking: Reported on 12/19/2020 12/05/20   Harry Pence, MD  pravastatin (PRAVACHOL) 20 MG tablet Take 20 mg by mouth daily. 02/22/20   [provider]  sucralfate (CARAFATE) 1 g tablet Take 1 tablet (1 g total) by mouth 4 (four) times daily -  with meals and at bedtime. Patient not taking: Reported on 12/19/2020 12/05/20   Harry Pence, MD  traMADol (ULTRAM) 50 MG tablet Take 1 tablet (50 mg total) by mouth every 6 (six) hours as needed. 05/31/20 05/31/21  Robley Fries, MD  Vitamin D, Ergocalciferol, (DRISDOL) 1.25 MG (50000 UNIT) CAPS capsule Take 50,000 Units by mouth once a week. 06/10/20   [provider]  warfarin (COUMADIN) 10 MG tablet Take 1 tablet (10 mg total) by mouth daily. Patient taking differently: Take 10 mg by mouth daily. Monday to Friday 10 mg 03/23/20   [name], MD  warfarin (COUMADIN) 5 MG tablet Takes 5 mg on saturday and sunday Patient taking differently: Take 5-10 mg by mouth See admin instructions. Takes 5 mg on Wednesday, Thursday Friday and Saturday all the other day take 10 mg 10/01/20   [name], MD  ranitidine (ZANTAC) 150 MG tablet Take 1 tablet (150 mg total) by mouth 2 (two) times daily. Patient not taking: Reported on 12/11/2019 03/19/18 12/11/19  Muthersbaugh, Jarrett Soho, PA-C     Family History  Problem Relation Age of Onset   Prostate cancer Neg Hx    Colon cancer Neg Hx    Pancreatic cancer Neg Hx    Breast cancer Neg Hx     Social History   Socioeconomic History   Marital status: Married    Spouse name: [name]   Number of children: 3   Years of education: Not on file   Highest  education level: Not on file  Occupational History   Not on file  Tobacco Use   Smoking status: Former    Packs/day: 1.00    Years: 20.00    Pack years: 20.00    Types: Cigarettes    Quit date: 02/24/1994    Years since quitting: 26.8   Smokeless tobacco: Never  Vaping Use   Vaping Use: Never used  Substance and Sexual Activity   Alcohol use: No   Drug use: No    Comment: hx of marijuana and crack 25 yrs ago last used   Sexual activity: Yes  Other Topics Concern   Not on file  Social History Narrative   Not on file   Social Determinants of Health   Financial Resource Strain: High Risk   Difficulty of Paying Living Expenses: Very hard  Food Insecurity: Food Insecurity Present   Worried About Running Out of Food in the Last Year: Sometimes true   Ran Out of Food in the Last Year: Not on file  Transportation Needs: Not on file  Physical Activity: Not on file  Stress: Not on file  Social Connections: Not on file    Review of Systems: A 12 point ROS discussed and pertinent positives are indicated in the HPI above.  All other systems are negative.  Review of Systems  Constitutional:  Positive for fatigue. Negative for appetite change.  Respiratory:  Negative for cough and shortness of breath.   Cardiovascular:  Negative for chest pain and leg swelling.  Gastrointestinal:  Positive for abdominal pain. Negative for diarrhea, nausea and vomiting.       Epigastric pain 6/10  Neurological:  Negative for headaches.   Vital Signs: 98.1, 149/97, HR 71, RR 18, 98% on room air  Physical Exam Constitutional:      General: He is not in acute distress.    Appearance: He is not ill-appearing.  HENT:     Mouth/Throat:     Mouth: Mucous membranes are moist.     Pharynx: Oropharynx is clear.  Cardiovascular:     Rate and Rhythm: Normal rate and regular rhythm.     Pulses: Normal pulses.     Heart sounds: Normal heart sounds.  Pulmonary:     Effort: Pulmonary effort is normal.      Breath sounds: Normal breath sounds.  Abdominal:     General: Bowel sounds are normal.     Palpations: Abdomen is soft.     Tenderness: There is abdominal tenderness.  Musculoskeletal:     Right lower leg: No edema.     Left lower leg: No edema.  Skin:    General: Skin is warm and dry.  Neurological:     Mental Status: He is alert and oriented to person, place, and time.    Imaging: CT Angio Abd/Pel W and/or Wo Contrast  Result Date: 12/05/2020 CLINICAL DATA:  Acute abdominal pain. EXAM: CTA ABDOMEN AND PELVIS WITH CONTRAST TECHNIQUE: Multidetector CT imaging of the abdomen and pelvis was performed using the standard protocol during bolus administration of intravenous contrast. Multiplanar reconstructed images and MIPs were obtained and reviewed to evaluate the vascular anatomy. CONTRAST:  56m OMNIPAQUE IOHEXOL 350 MG/ML SOLN COMPARISON:  08/21/2020 FINDINGS: VASCULAR Aorta: Normal caliber abdominal aorta. No dissection. No atherosclerotic calcification. Celiac: Slightly irregular appearance could suggest FMD. No atherosclerotic calcifications or stenosis. SMA: Normal. Renals: Normal IMA: Normal Inflow: Normal Proximal Outflow: Normal Veins: Chronic portal vein thrombosis with extensive cavernous transformation in the porta hepatis with a large tangle of vessels. There also perigastric collaterals. Review of the MIP images confirms the above findings. NON-VASCULAR Lower chest: The lung bases are clear of acute process. No pleural effusion or pulmonary lesions. The heart is normal in size. No pericardial effusion. The distal esophagus and aorta are unremarkable. Hepatobiliary: No hepatic lesions or intrahepatic biliary dilatation. No contrast is seen in the TIPS shunt consistent with chronic thrombosis/occlusion. The gallbladder is unremarkable. No common bile duct dilatation. Pancreas: No mass, inflammation or ductal dilatation. Spleen: Normal size. No focal lesions. Adrenals/Urinary Tract: The  adrenal glands and kidneys are unremarkable. Thick wall bladder could be due to lack of distension or bladder outlet obstruction. Stomach/Bowel: The stomach, duodenum, small bowel and colon are grossly normal. No acute inflammatory process, mass lesions or obstructive findings. Stable colonic diverticulosis. The appendix is normal. Lymphatic: No abdominal or pelvic lymphadenopathy. Reproductive: Brachytherapy seeds are noted in the prostate gland. The seminal vesicles are unremarkable. Other: No pelvic mass or adenopathy. No free pelvic fluid collections. No  inguinal mass or adenopathy. No abdominal wall hernia or subcutaneous lesions. Musculoskeletal: No significant bony findings. IMPRESSION: 1. Chronic portal vein thrombosis with extensive cavernous transformation in the porta hepatis. 2. No contrast is seen in the TIPS shunt consistent with chronic thrombosis/occlusion. 3. Normal aorta and branch vessels. Could not exclude FMD involving the celiac axis. 4. Stable colonic diverticulosis without findings for acute diverticulitis. 5. Thick wall bladder could be due to lack of distension or bladder outlet obstruction. Electronically Signed   By: Marijo Sanes M.D.   On: 12/05/2020 07:05    Labs:  CBC: Recent Labs    08/21/20 1400 09/03/20 1252 11/16/20 0810 12/05/20 0355  WBC 3.5* 2.3* 2.9* 4.4  HGB 12.0* 12.8* 12.4* 12.6*  HCT 37.6* 38.9* 37.4* 39.1  PLT 229 235 211 234    COAGS: Recent Labs    05/28/20 1122 05/31/20 1135 10/26/20 1325 11/16/20 0802 12/05/20 0355 12/25/20 1005  INR 2.3*   < > 1.9* 3.1* 2.9* 1.1  APTT 36  --   --   --   --  29   < > = values in this interval not displayed.    BMP: Recent Labs    08/21/20 1400 09/03/20 1252 11/16/20 0810 12/05/20 0355  NA 138 136 141 139  K 4.3 4.0 4.2 4.4  CL 107 107 109 105  CO2 27 24 24 28   GLUCOSE 92 100* 98 95  BUN 15 15 16 15   CALCIUM 9.6 9.0 8.8* 9.4  CREATININE 1.08 0.95 1.17 1.20  GFRNONAA >60 >60 >60 >60     LIVER FUNCTION TESTS: Recent Labs    08/21/20 1400 09/03/20 1252 11/16/20 0810 12/05/20 0355  BILITOT 1.0 1.1 0.8 1.1  AST 39 152* 48* 39  ALT 51* 134* 58* 56*  ALKPHOS 59 71 78 60  PROT 6.4* 7.3 7.3 6.8  ALBUMIN 3.2* 3.7 3.7 3.5    TUMOR MARKERS: No results for input(s): AFPTM, CEA, CA199, CHROMGRNA in the last 8760 hours.  Assessment and Plan:  Portal thrombosis; history of TIPS; history of attempted TIPS recanalization; ongoing abdominal pain  Jatinder Mcdonagh, 65 year old male, presents today for a repeated attempt at TIPS and portal vein recanalization under general anesthesia with planned overnight observation.   Risks and benefits of TIPS, BRTO and/or additional variceal embolization were discussed with the patient and/or the patient's family including, but not limited to, infection, bleeding, damage to adjacent structures, worsening hepatic and/or cardiac function, non-target embolization and death.   This interventional procedure involves the use of X-rays and because of the nature of the planned procedure, it is possible that we will have prolonged use of X-ray fluoroscopy. Potential radiation risks to you include (but are not limited to) the following: - A slightly elevated risk for cancer  several years later in life. This risk is typically less than 0.5% percent. This risk is low in comparison to the normal incidence of human cancer, which is 33% for women and 50% for men according to the [place]. - Radiation induced injury can include skin redness, resembling a rash, tissue breakdown / ulcers and hair loss (which can be temporary or permanent).  The likelihood of either of these occurring depends on the difficulty of the procedure and whether you are sensitive to radiation due to previous procedures, disease, or genetic conditions.  IF your procedure requires a prolonged use of radiation, you will be notified and given written instructions for further  action.  It is your responsibility to  monitor the irradiated area for the 2 weeks following the procedure and to notify your physician if you are concerned that you have suffered a radiation induced injury.    All of the patient's questions were answered, patient is agreeable to proceed. He has been NPO. He has not taken his coumadin for the past 4 days. Vitals have been reviewed. Labs are still pending but will be reviewed prior to the start of his procedure.   Consent signed and in chart.   Thank you for this interesting consult.  I greatly enjoyed meeting Ritter Helsley and look forward to participating in their care.  A copy of this report was sent to the requesting provider on this date.  Electronically Signed: Soyla Dryer, AGACNP-BC [phone] 12/25/2020, 11:18 AM   I spent a total of  40 Minutes   in face to face in clinical consultation, greater than 50% of which was counseling/coordinating care for tips and portal vein recanalization

## 2020-12-25 NOTE — Transfer of Care (Signed)
Immediate Anesthesia Transfer of Care Note  Patient: Harry Lucero  Procedure(s) Performed: TIPS  Patient Location: PACU  Anesthesia Type:General  Level of Consciousness: drowsy, patient cooperative and responds to stimulation  Airway & Oxygen Therapy: Patient Spontanous Breathing and Patient connected to face mask oxygen  Post-op Assessment: Report given to RN and Post -op Vital signs reviewed and stable  Post vital signs: Reviewed and stable  Last Vitals:  Vitals Value Taken Time  BP 133/96 12/25/20 1854  Temp    Pulse 101 12/25/20 1855  Resp 19 12/25/20 1855  SpO2 99 % 12/25/20 1855  Vitals shown include unvalidated device data.  Last Pain:  Vitals:   12/25/20 1027  PainSc: 6       Patients Stated Pain Goal: 5 (76/16/07 3710)  Complications: No notable events documented.

## 2020-12-25 NOTE — Anesthesia Procedure Notes (Signed)
Procedure Name: Intubation Date/Time: 12/25/2020 11:59 AM Performed by: Leonor Liv, CRNA Pre-anesthesia Checklist: Patient identified, Emergency Drugs available, Suction available and Patient being monitored Patient Re-evaluated:Patient Re-evaluated prior to induction Oxygen Delivery Method: Circle System Utilized Preoxygenation: Pre-oxygenation with 100% oxygen Induction Type: IV induction Ventilation: Mask ventilation without difficulty Laryngoscope Size: Mac and 3 Grade View: Grade I Tube type: Oral Tube size: 7.5 mm Number of attempts: 1 Airway Equipment and Method: Stylet and Oral airway Placement Confirmation: ETT inserted through vocal cords under direct vision, positive ETCO2 and breath sounds checked- equal and bilateral Secured at: 23 cm Tube secured with: Tape (left) Dental Injury: Teeth and Oropharynx as per pre-operative assessment

## 2020-12-25 NOTE — Procedures (Signed)
Interventional Radiology Procedure Note  Procedure:  1)  Ultrasound guided percutaneous access of the splenic vein 2)  Catheterization of the splenic vein 3)  Ultrasound guided vascular access of the internal right jugular vein 4)  Laser atherectomy of the indwelling TIPS stent 5)  Balloon-assisted sharp recanalization of the portal vein 6)  TIPS re-lining 7)  Balloon angioplasty of the portal vein 8)  Splenic and portal vein stent placement 9)  Coil embolization of the splenic catheter track  Findings: Please refer to procedural dictation for full description.    Complications: None immediate  Estimated Blood Loss: 40 mL  Recommendations: Admit to IR overnight for observation. Therapeutic Lovenox started in IR. Resume coumadin immediately. Advance diet as tolerated. AM labs to include CBC, CMP, INR.   Ruthann Cancer, MD Pager: [phone]

## 2020-12-25 NOTE — Anesthesia Postprocedure Evaluation (Signed)
Anesthesia Post Note  Patient: Harry Lucero  Procedure(s) Performed: TIPS     Patient location during evaluation: PACU Anesthesia Type: General Level of consciousness: sedated Pain management: pain level controlled Vital Signs Assessment: post-procedure vital signs reviewed and stable Respiratory status: spontaneous breathing and respiratory function stable Cardiovascular status: stable Postop Assessment: no apparent nausea or vomiting Anesthetic complications: no   No notable events documented.  Last Vitals:  Vitals:   12/25/20 1915 12/25/20 1930  BP: (!) 155/95 (!) 150/99  Pulse: (!) 102 97  Resp: 20 18  Temp:  36.6 C  SpO2: 96% 94%    Last Pain:  Vitals:   12/25/20 1930  PainSc: 0-No pain                 Maebry Obrien DANIEL

## 2020-12-25 NOTE — Anesthesia Preprocedure Evaluation (Addendum)
Anesthesia Evaluation  Patient identified by MRN, date of birth, ID band Patient awake    Reviewed: Allergy & Precautions, NPO status , Patient's Chart, lab work & pertinent test results  Airway Mallampati: II  TM Distance: >3 FB Neck ROM: Full    Dental  (+) Missing   Pulmonary former smoker,    Pulmonary exam normal breath sounds clear to auscultation       Cardiovascular hypertension, Normal cardiovascular exam Rhythm:Regular Rate:Normal     Neuro/Psych  Headaches, negative psych ROS   GI/Hepatic negative GI ROS, Portal vein thrombosis   Endo/Other  diabetes, Oral Hypoglycemic Agents  Renal/GU negative Renal ROS     Musculoskeletal negative musculoskeletal ROS (+)   Abdominal   Peds  Hematology  (+) Blood dyscrasia, , HLD Antiphospholipid antibody syndrome   Anesthesia Other Findings Ascites  PVT  Reproductive/Obstetrics                             Anesthesia Physical Anesthesia Plan  ASA: 3  Anesthesia Plan: General   Post-op Pain Management:    Induction: Intravenous  PONV Risk Score and Plan: 2 and Ondansetron, Dexamethasone and Treatment may vary due to age or medical condition  Airway Management Planned: Oral ETT  Additional Equipment: Arterial line  Intra-op Plan:   Post-operative Plan: Extubation in OR  Informed Consent: I have reviewed the patients History and Physical, chart, labs and discussed the procedure including the risks, benefits and alternatives for the proposed anesthesia with the patient or authorized representative who has indicated his/her understanding and acceptance.     Dental advisory given  Plan Discussed with: CRNA  Anesthesia Plan Comments:        Anesthesia Quick Evaluation

## 2020-12-26 LAB — COMPREHENSIVE METABOLIC PANEL
ALT: 42 U/L (ref 0–44)
AST: 55 U/L — ABNORMAL HIGH (ref 15–41)
Albumin: 3.2 g/dL — ABNORMAL LOW (ref 3.5–5.0)
Alkaline Phosphatase: 45 U/L (ref 38–126)
Anion gap: 8 (ref 5–15)
BUN: 15 mg/dL (ref 8–23)
CO2: 24 mmol/L (ref 22–32)
Calcium: 9.1 mg/dL (ref 8.9–10.3)
Chloride: 105 mmol/L (ref 98–111)
Creatinine, Ser: 1.23 mg/dL (ref 0.61–1.24)
GFR, Estimated: >60 mL/min (ref >60)
Glucose, Bld: 114 mg/dL — ABNORMAL HIGH (ref 70–99)
Potassium: 4.7 mmol/L (ref 3.5–5.1)
Sodium: 137 mmol/L (ref 135–145)
Total Bilirubin: 1.6 mg/dL — ABNORMAL HIGH (ref 0.3–1.2)
Total Protein: 6.4 g/dL — ABNORMAL LOW (ref 6.5–8.1)

## 2020-12-26 LAB — CBC
HCT: 38 % — ABNORMAL LOW (ref 39.0–52.0)
Hemoglobin: 12.5 g/dL — ABNORMAL LOW (ref 13.0–17.0)
MCH: 27.2 pg (ref 26.0–34.0)
MCHC: 32.9 g/dL (ref 30.0–36.0)
MCV: 82.6 fL (ref 80.0–100.0)
Platelets: 178 10*3/uL (ref 150–400)
RBC: 4.6 MIL/uL (ref 4.22–5.81)
RDW: 14.7 % (ref 11.5–15.5)
WBC: 6.5 10*3/uL (ref 4.0–10.5)
nRBC: 0 % (ref 0.0–0.2)

## 2020-12-26 LAB — PROTIME-INR
INR: 1.3 — ABNORMAL HIGH (ref 0.8–1.2)
Prothrombin Time: 15.8 seconds — ABNORMAL HIGH (ref 11.4–15.2)

## 2020-12-26 LAB — GLUCOSE, CAPILLARY: Glucose-Capillary: 98 mg/dL (ref 70–99)

## 2020-12-26 MED ORDER — OXYCODONE HCL 5 MG PO TABS: 5.0000 mg | ORAL_TABLET | Freq: Four times a day (QID) | ORAL | 0 refills | Status: AC | PRN

## 2020-12-26 NOTE — Discharge Summary (Signed)
Patient ID: Harry Lucero MRN: [id number] DOB/AGE: [birth year] 65 y.o.  Admit date: 12/25/2020 Discharge date: 12/26/2020  Supervising Physician: Ruthann Cancer  Patient Status: [facility] - In-pt  Admission Diagnoses: Portal vein thrombosis   Discharge Diagnoses:  Active Problems:   Portal vein thrombosis   Discharged Condition: good  Hospital Course: Harry Lucero is a 66 y.o. male with a medical history significant for portal vein thrombosis s/p TIPS creation with portal and mesenteric vein thrombectomy, attempted balloon maceration of the portal vein and SMV thrombosis 11/22/19 at [facility] in [place]. A few days after this he underwent transjugular lysis of the portal and mesenteric veins. He was discharged home on Eliquis.He was admitted to [facility] 12/11/19 with worsening abdominal pain and imaging was positive for TIPS occlusion. Hematology was consulted and his work up was notable as positive for antiphospholipid antibody. He was started on lovenox and coumadin and Eliquis was discontinued. While inpatient IR was consulted and he was approved for outpatient TIPS recanalization. This was attempted 01/09/20 and there was an inability to cannulate the central aspect of the TIPS and unsuccessful cannulation of the splenic vein via transplenic approach. He was admitted for overnight observation and discharged home with continued outpatient IR follow up and coumadin. He followed up with  Dr. Serafina Royals 10/23/20 and 11/21/20 with complaints of increasing/persistent abdominal pain and melena. After much discussion, the patient agreed to proceed with the recommended re-attempt at TIPS recanalization with advanced techniques (transplenic access, laser atherectomy of stent, etc) under general anesthesia. On 12/25/20, pt underwent successful balloon-assisted sharp recanalization of the portal vein, balloon angioplasty of the portal vein, TIPS endograft relining/stent placement, portal and splenic vein stent  placement and coil embolization of splenic parenchymal access track.  Consults: None  Treatments: Observation, pain management  Results for orders placed or performed during the hospital encounter of 12/25/20  Protime-INR  Result Value Ref Range   Prothrombin Time 14.5 11.4 - 15.2 seconds   INR 1.1 0.8 - 1.2  APTT  Result Value Ref Range   aPTT 29 24 - 36 seconds  Glucose, capillary  Result Value Ref Range   Glucose-Capillary 116 (H) 70 - 99 mg/dL  Glucose, capillary  Result Value Ref Range   Glucose-Capillary 124 (H) 70 - 99 mg/dL  Protime-INR  Result Value Ref Range   Prothrombin Time 15.8 (H) 11.4 - 15.2 seconds   INR 1.3 (H) 0.8 - 1.2  CBC  Result Value Ref Range   WBC 6.5 4.0 - 10.5 K/uL   RBC 4.60 4.22 - 5.81 MIL/uL   Hemoglobin 12.5 (L) 13.0 - 17.0 g/dL   HCT 38.0 (L) 39.0 - 52.0 %   MCV 82.6 80.0 - 100.0 fL   MCH 27.2 26.0 - 34.0 pg   MCHC 32.9 30.0 - 36.0 g/dL   RDW 14.7 11.5 - 15.5 %   Platelets 178 150 - 400 K/uL   nRBC 0.0 0.0 - 0.2 %  Comprehensive metabolic panel  Result Value Ref Range   Sodium 137 135 - 145 mmol/L   Potassium 4.7 3.5 - 5.1 mmol/L   Chloride 105 98 - 111 mmol/L   CO2 24 22 - 32 mmol/L   Glucose, Bld 114 (H) 70 - 99 mg/dL   BUN 15 8 - 23 mg/dL   Creatinine, Ser 1.23 0.61 - 1.24 mg/dL   Calcium 9.1 8.9 - 10.3 mg/dL   Total Protein 6.4 (L) 6.5 - 8.1 g/dL   Albumin 3.2 (L) 3.5 - 5.0  g/dL   AST 55 (H) 15 - 41 U/L   ALT 42 0 - 44 U/L   Alkaline Phosphatase 45 38 - 126 U/L   Total Bilirubin 1.6 (H) 0.3 - 1.2 mg/dL   GFR, Estimated >60 >60 mL/min   Anion gap 8 5 - 15  Glucose, capillary  Result Value Ref Range   Glucose-Capillary 98 70 - 99 mg/dL    Physical Exam Vitals reviewed.  Constitutional:      Appearance: Normal appearance.  HENT:     Head: Normocephalic and atraumatic.  Eyes:     Pupils: Pupils are equal, round, and reactive to light.  Cardiovascular:     Rate and Rhythm: Normal rate and regular rhythm.     Pulses:  Normal pulses.     Heart sounds: No murmur heard.   No gallop.  Pulmonary:     Effort: Pulmonary effort is normal. No respiratory distress.     Breath sounds: Normal breath sounds. No stridor. No wheezing, rhonchi or rales.  Abdominal:     General: Bowel sounds are normal. There is distension.     Palpations: Abdomen is soft.     Tenderness: There is abdominal tenderness. There is no guarding.  Musculoskeletal:     Right lower leg: No edema.     Left lower leg: No edema.  Skin:    General: Skin is warm and dry.     Comments: Right neck and left lateral abdominal surgical sites unremarkable. Dressing C/D/I. No bleeding, redness or drainage noted.   Neurological:     Mental Status: He is alert and oriented to person, place, and time.  Psychiatric:        Mood and Affect: Mood normal.        Behavior: Behavior normal.        Thought Content: Thought content normal.        Judgment: Judgment normal.     Discharge Exam: Blood pressure 135/88, pulse 75, temperature 98.8 F (37.1 C), temperature source Oral, resp. rate 19, height _0  (1.727 m), weight 206 lb 12.7 oz (93.8 kg), SpO2 98 %.   Disposition: Discharge disposition: 01-Home or Self Care       Discharge Instructions     Call MD for:  difficulty breathing, headache or visual disturbances   Complete by: As directed    Call MD for:  extreme fatigue   Complete by: As directed    Call MD for:  hives   Complete by: As directed    Call MD for:  persistant dizziness or light-headedness   Complete by: As directed    Call MD for:  persistant nausea and vomiting   Complete by: As directed    Call MD for:  redness, tenderness, or signs of infection (pain, swelling, redness, odor or green/yellow discharge around incision site)   Complete by: As directed    Call MD for:  severe uncontrolled pain   Complete by: As directed    Call MD for:  temperature >100.4   Complete by: As directed    Diet - low sodium heart healthy    Complete by: As directed    Discharge wound care:   Complete by: As directed    Keep surgical sites clean and dry. Change bandage as needed. No bandage needed after sites have healed.   Driving Restrictions   Complete by: As directed    Do not drive for 2 weeks. Do not drive when taking narcotic medication  Increase activity slowly   Complete by: As directed    Rest today. Stay hydrated. Do not lift, bend, stoop or lift anything over 10 lbs for the next few days.  Call IR with questions or concerns [phone]   Lifting restrictions   Complete by: As directed    No bending, stooping, lifting more than 10 lbs for the next few days   Sexual Activity Restrictions   Complete by: As directed    As tolerated      Allergies as of 12/26/2020       Reactions   Lisinopril    cough        Medication List     TAKE these medications    albuterol 108 (90 Base) MCG/ACT inhaler Commonly known as: VENTOLIN HFA Inhale 2 puffs into the lungs 2 (two) times daily as needed for shortness of breath.   Contour Next EZ w/Device Kit See admin instructions.   Contour Next Test test strip Generic drug: glucose blood daily.   fluticasone 50 MCG/ACT nasal spray Commonly known as: FLONASE Place 1 spray into both nostrils daily as needed for congestion.   glipiZIDE 2.5 MG 24 hr tablet Commonly known as: GLUCOTROL XL Take 2.5 mg by mouth daily.   latanoprost 0.005 % ophthalmic solution Commonly known as: XALATAN Place 1 drop into both eyes daily in the afternoon. [place] daily.   multivitamin tablet Take 1 tablet by mouth daily.   omeprazole 40 MG capsule Commonly known as: PRILOSEC Take 1 capsule (40 mg total) by mouth daily.   oxyCODONE 5 MG immediate release tablet Commonly known as: Roxicodone Take 1 tablet (5 mg total) by mouth every 6 (six) hours as needed for up to 4 days for severe pain.   pravastatin 20 MG tablet Commonly known as:  PRAVACHOL Take 20 mg by mouth daily.   sucralfate 1 g tablet Commonly known as: Carafate Take 1 tablet (1 g total) by mouth 4 (four) times daily -  with meals and at bedtime.   traMADol 50 MG tablet Commonly known as: Ultram Take 1 tablet (50 mg total) by mouth every 6 (six) hours as needed.   Vitamin D (Ergocalciferol) 1.25 MG (50000 UNIT) Caps capsule Commonly known as: DRISDOL Take 50,000 Units by mouth once a week.   warfarin 5 MG tablet Commonly known as: COUMADIN Takes 5 mg on saturday and sunday What changed:  how much to take how to take this when to take this additional instructions               Discharge Care Instructions  (From admission, onward)           Start     Ordered   12/26/20 0000  Discharge wound care:       Comments: Keep surgical sites clean and dry. Change bandage as needed. No bandage needed after sites have healed.   12/26/20 1344            Follow-up Information     [place] Follow up in 13 day(s).   Why: Schedulers will call to schedule telephone follow up with Dr. Serafina Royals on 01/09/21. Contact information: [place] [address] [phone]                  Electronically Signed: Tyson Alias, NP 12/26/2020, 1:50 PM   I have spent Greater Than 30 Minutes discharging Harry Lucero.

## 2020-12-26 NOTE — Progress Notes (Signed)
Mobility Specialist Progress Note   12/26/20 1500  Mobility  Activity Ambulated in hall  Level of Assistance Independent  Assistive Device None  Distance Ambulated (ft) 3450 ft  Mobility Ambulated independently in hallway  Mobility Response Tolerated well  Mobility performed by Mobility specialist  $Mobility charge 1 Mobility   Received pt standing in room having no complaints and agreeable to mobility. Asymptomatic throughout ambulation, returned back to bed w/ call bell by side and all needs met.  Holland Falling Mobility Specialist Phone Number [phone]

## 2020-12-26 NOTE — Progress Notes (Signed)
Patient being discharged. This nurse reviewed discharge instructions with patient including medications. Patient verbalized full understanding. Wife at bedside, will take [place] home.

## 2020-12-27 ENCOUNTER — Other Ambulatory Visit: Payer: Self-pay | Admitting: Interventional Radiology

## 2020-12-27 DIAGNOSIS — I81 Portal vein thrombosis: Secondary | ICD-10-CM

## 2020-12-27 LAB — GLUCOSE, CAPILLARY: Glucose-Capillary: 133 mg/dL — ABNORMAL HIGH (ref 70–99)

## 2021-01-01 ENCOUNTER — Other Ambulatory Visit (HOSPITAL_COMMUNITY): Payer: Self-pay | Admitting: Interventional Radiology

## 2021-01-01 ENCOUNTER — Encounter (HOSPITAL_COMMUNITY): Payer: Self-pay | Admitting: Radiology

## 2021-01-01 DIAGNOSIS — R188 Other ascites: Secondary | ICD-10-CM

## 2021-01-09 ENCOUNTER — Ambulatory Visit
Admission: RE | Admit: 2021-01-09 | Discharge: 2021-01-09 | Disposition: A | Discharge status: Home / Self Care | Payer: BC Managed Care – PPO | Source: Ambulatory Visit | Attending: Interventional Radiology | Admitting: Interventional Radiology

## 2021-01-09 ENCOUNTER — Other Ambulatory Visit: Payer: Self-pay

## 2021-01-09 DIAGNOSIS — I81 Portal vein thrombosis: Secondary | ICD-10-CM

## 2021-01-09 NOTE — Progress Notes (Signed)
Reason for follow up: 2 weeks status post trans-splenic portal vein and TIPS recanalization, telephone virtual visit  History of present illness: "65 year old non-cirrhotic male with history of portal thrombus and cavernous transformation status post TIPS creation and portal thrombectomy at outside facility in September 2021.  Failed attempt at TIPS recanalization in November 2021 by me.  He compensated well from a portal hypertension standpoint initially, but now has experienced increasing and persistent abdominal pain which is vague and difficult to determine etiology.  Endoscopic evaluation has been relatively unrevealing.     It is possible that his portal hypertension is causing his pain, but difficult to prove.  In the interim since last visit, he has had increased melena.  Some of this may be contributed to mildly elevated INR, however he was not technically supra-therapeutic.   After long discussion, [name] wishes to proceed with attempted portal vein and TIPS recanalization.  We dicussed the potential risks, benefits, and technical aspects of this procedure including general anesthesia, transplenic and venous access, and potential use of advanced recanalization techniques including sharp and laser recanalization.  Additionally, I emphasized that his abdominal pain may not improve, and we can't guarantee long term patency of the new stent(s), however staying on anticoagulation will help.  He is amenable to proceed in hopes that his abdominal pain and bleeding will resolve."  He underwent transsplenic portal venous recanalization with laser atherectomy assisted recanalization of the occluded TIPS on 12/25/20.  He presents today for short term follow up.  He states he is overall doing OK.  He is still having some pain, but this pain is different.  It is very difficult to describe.  He states that the pain is in a different location than prior to the procedure, but can't describe where that is  - mostly epigastric region.  The pain that he was having prior to the procedure that required multiple ED visits has gone away.  The current pain is exacerbated by heavy lifting and bending over, when standing up from a seated position, and with deep breathing.  The pain does not radiate.  He rates the pain as a 7-8 out of 10.  The pre procedural pain he rated an 8/10.  The pain is now more intermittent, while it was constant prior to the procedure.  He has not taken any medication for the pain.  He does endorse taking some turmeric which he feels has significantly improved the pain.    He endorses a normal appetite.  No change in stool, still having some intermittent blood in stool, dark red, 1-2 times per month at most.  Continues taking coumadin without any missed doses.  Past Medical History:  Diagnosis Date   Antiphospholipid antibody syndrome (HCC)    Cough due to ACE inhibitor    clear mucous due to lisinopril   COVID 01/06-2020   asymptomatic   Headache    Hypertension    off lisinopril since 05-08-2020   Lupus anticoagulant positive    Portal vein thrombosis    Pre-diabetes    Prostate cancer ([place])    Wears glasses    for reading    Past Surgical History:  Procedure Laterality Date   CYSTOSCOPY N/A 05/31/2020   Procedure: CYSTOSCOPY FLEXIBLE;  Surgeon: Robley Fries, MD;  Location: [facility];  Service: Urology;  Laterality: N/A;  NO SEEDS FOUND BY DR. PACE   IR RADIOLOGIST EVAL & MGMT  01/03/2020   IR RADIOLOGIST EVAL &  MGMT  04/18/2020   IR RADIOLOGIST EVAL & MGMT  10/23/2020   IR RADIOLOGIST EVAL & MGMT  11/21/2020   IR TIPS  11/02/2019   IR TIPS REVISION MOD SED  01/09/2020   IR TIPS REVISION MOD SED  12/25/2020   IR TRANSCATH PLC STENT  INITIAL VEIN  INC ANGIOPLASTY  12/25/2020   IR TRANSHEPATIC PORTOGRAM W HEMO  12/25/2020   IR US GUIDE VASC ACCESS LEFT  12/25/2020   PROSTATE BIOPSY     RADIOACTIVE SEED IMPLANT N/A 05/31/2020   Procedure: RADIOACTIVE SEED  IMPLANT/BRACHYTHERAPY IMPLANT;  Surgeon: Robley Fries, MD;  Location: [place];  Service: Urology;  Laterality: N/A;  SEEDS IMPLANTED   RADIOLOGY WITH ANESTHESIA N/A 01/09/2020   Procedure: IR WITH ANESTHESIA TIPS REVISION;  Surgeon: Radiologist, Medication, MD;  Location: [place];  Service: Radiology;  Laterality: N/A;   RADIOLOGY WITH ANESTHESIA N/A 12/25/2020   Procedure: TIPS;  Surgeon: Suzette Battiest, MD;  Location: [place];  Service: Radiology;  Laterality: N/A;   TIPS PROCEDURE      Allergies: Lisinopril  Medications: Prior to Admission medications   Medication Sig Start Date End Date Taking? Authorizing Provider  albuterol (VENTOLIN HFA) 108 (90 Base) MCG/ACT inhaler Inhale 2 puffs into the lungs 2 (two) times daily as needed for shortness of breath. 12/14/20   [provider]  Blood Glucose Monitoring Suppl (CONTOUR NEXT EZ) w/Device KIT See admin instructions. 03/02/20   [provider]  CONTOUR NEXT TEST test strip daily. 03/01/20   [provider]  fluticasone (FLONASE) 50 MCG/ACT nasal spray Place 1 spray into both nostrils daily as needed for congestion. 09/30/20   [provider]  glipiZIDE (GLUCOTROL XL) 2.5 MG 24 hr tablet Take 2.5 mg by mouth daily. 02/22/20   [provider]  latanoprost (XALATAN) 0.005 % ophthalmic solution Place 1 drop into both eyes daily in the afternoon. 1500 12/10/20   [provider]  Microlet Lancets MISC daily. 03/01/20   [provider]  Multiple Vitamin (MULTIVITAMIN) tablet Take 1 tablet by mouth daily.    [provider]  omeprazole (PRILOSEC) 40 MG capsule Take 1 capsule (40 mg total) by mouth daily. Patient not taking: Reported on 12/19/2020 12/05/20   [name], MD  pravastatin (PRAVACHOL) 20 MG tablet Take 20 mg by mouth daily. 02/22/20   [provider]  sucralfate (CARAFATE) 1 g tablet Take 1 tablet (1 g total) by mouth 4 (four) times daily  -  with meals and at bedtime. Patient not taking: Reported on 12/19/2020 12/05/20   [name], MD  traMADol (ULTRAM) 50 MG tablet Take 1 tablet (50 mg total) by mouth every 6 (six) hours as needed. 05/31/20 05/31/21  Robley Fries, MD  Vitamin D, Ergocalciferol, (DRISDOL) 1.25 MG (50000 UNIT) CAPS capsule Take 50,000 Units by mouth once a week. 06/10/20   [provider]  warfarin (COUMADIN) 5 MG tablet Takes 5 mg on saturday and sunday Patient taking differently: Take 5-10 mg by mouth See admin instructions. Takes 5 mg on Wednesday, Thursday Friday and Saturday all the other day take 10 mg 10/01/20   [name], MD  ranitidine (ZANTAC) 150 MG tablet Take 1 tablet (150 mg total) by mouth 2 (two) times daily. Patient not taking: Reported on 12/11/2019 03/19/18 12/11/19  Muthersbaugh, Jarrett Soho, PA-C     Family History  Problem Relation Age of Onset   Prostate cancer Neg Hx    Colon cancer Neg  Hx    Pancreatic cancer Neg Hx    Breast cancer Neg Hx     Social History   Socioeconomic History   Marital status: Married    Spouse name: [name]   Number of children: 3   Years of education: Not on file   Highest education level: Not on file  Occupational History   Not on file  Tobacco Use   Smoking status: Former    Packs/day: 1.00    Years: 20.00    Pack years: 20.00    Types: Cigarettes    Quit date: 02/24/1994    Years since quitting: 26.8   Smokeless tobacco: Never  Vaping Use   Vaping Use: Never used  Substance and Sexual Activity   Alcohol use: No   Drug use: No    Comment: hx of marijuana and crack 25 yrs ago last used   Sexual activity: Yes  Other Topics Concern   Not on file  Social History Narrative   Not on file   Social Determinants of Health   Financial Resource Strain: High Risk   Difficulty of Paying Living Expenses: Very hard  Food Insecurity: Food Insecurity Present   Worried About Running Out of Food in the Last Year: Sometimes true   Ran Out of  Food in the Last Year: Not on file  Transportation Needs: Not on file  Physical Activity: Not on file  Stress: Not on file  Social Connections: Not on file     Vital Signs: There were no vitals taken for this visit.  No physical examination performed in lieu of telephone visit.  Imaging: No results found.  Labs:  CBC: Recent Labs    09/03/20 1252 11/16/20 0810 12/05/20 0355 12/26/20 0136  WBC 2.3* 2.9* 4.4 6.5  HGB 12.8* 12.4* 12.6* 12.5*  HCT 38.9* 37.4* 39.1 38.0*  PLT 235 211 234 178    COAGS: Recent Labs    05/28/20 1122 05/31/20 1135 11/16/20 0802 12/05/20 0355 12/25/20 1005 12/26/20 0136  INR 2.3*   < > 3.1* 2.9* 1.1 1.3*  APTT 36  --   --   --  29  --    < > = values in this interval not displayed.    BMP: Recent Labs    09/03/20 1252 11/16/20 0810 12/05/20 0355 12/26/20 0136  NA 136 141 139 137  K 4.0 4.2 4.4 4.7  CL 107 109 105 105  CO2 24 24 28 24   GLUCOSE 100* 98 95 114*  BUN 15 16 15 15   CALCIUM 9.0 8.8* 9.4 9.1  CREATININE 0.95 1.17 1.20 1.23  GFRNONAA >60 >60 >60 >60    LIVER FUNCTION TESTS: Recent Labs    09/03/20 1252 11/16/20 0810 12/05/20 0355 12/26/20 0136  BILITOT 1.1 0.8 1.1 1.6*  AST 152* 48* 39 55*  ALT 134* 58* 56* 42  ALKPHOS 71 78 60 45  PROT 7.3 7.3 6.8 6.4*  ALBUMIN 3.7 3.7 3.5 3.2*    Assessment and Plan: 65 year old male with history of antiphospholipid syndrome who presented at an outside hospital in September 2021 with portal thrombus treat by thrombectomy from a TIPS approach.  The TIPS and portal vein were noted to be thrombosed shortly thereafter, and has since developed cavernous transformation of the portal vein.    He is now status post trans-splenic portal vein recanalization, TIPS recanalization and relining with portal/splenic vein stent placement on 12/25/20.  This procedure was attempted in hopes of possibly improving his pain.  Pain is difficult to gauge right now, but overall seems  slightly improved.  No encephalopathy or signs of worsening portal hypertension.  We discussed realistic expectations that the pain may never go away completely, and that his case if very complex.  At this time we will manage solely based on symptoms as obtaining follow up imaging would not necessarily be directive.  Plan to follow up in 1 month.  No imaging required at this time.  Please continue coumadin.  Electronically Signed: Suzette Battiest 01/09/2021, 1:37 PM   I spent a total of 40 Minutes in telephone clinical consultation, greater than 50% of which was counseling/coordinating care for portal vein occlusion.

## 2021-01-10 ENCOUNTER — Other Ambulatory Visit: Payer: Self-pay | Admitting: Interventional Radiology

## 2021-01-10 ENCOUNTER — Encounter: Payer: Self-pay | Admitting: *Deleted

## 2021-01-10 DIAGNOSIS — I81 Portal vein thrombosis: Secondary | ICD-10-CM

## 2021-01-10 HISTORY — PX: IR RADIOLOGIST EVAL & MGMT: IMG5224

## 2021-01-18 ENCOUNTER — Inpatient Hospital Stay: Payer: BC Managed Care – PPO

## 2021-01-18 ENCOUNTER — Inpatient Hospital Stay: Payer: BC Managed Care – PPO | Attending: Hematology and Oncology

## 2021-01-18 ENCOUNTER — Other Ambulatory Visit: Payer: Self-pay

## 2021-01-18 DIAGNOSIS — D6861 Antiphospholipid syndrome: Secondary | ICD-10-CM | POA: Diagnosis present

## 2021-01-18 DIAGNOSIS — I81 Portal vein thrombosis: Secondary | ICD-10-CM | POA: Insufficient documentation

## 2021-01-18 DIAGNOSIS — C61 Malignant neoplasm of prostate: Secondary | ICD-10-CM | POA: Diagnosis present

## 2021-01-18 LAB — CBC WITH DIFFERENTIAL (CANCER CENTER ONLY)
Abs Immature Granulocytes: 0.01 10*3/uL (ref 0.00–0.07)
Basophils Absolute: 0 10*3/uL (ref 0.0–0.1)
Basophils Relative: 1 %
Eosinophils Absolute: 0.1 10*3/uL (ref 0.0–0.5)
Eosinophils Relative: 2 %
HCT: 38.9 % — ABNORMAL LOW (ref 39.0–52.0)
Hemoglobin: 12.8 g/dL — ABNORMAL LOW (ref 13.0–17.0)
Immature Granulocytes: 0 %
Lymphocytes Relative: 42 %
Lymphs Abs: 1.5 10*3/uL (ref 0.7–4.0)
MCH: 26.7 pg (ref 26.0–34.0)
MCHC: 32.9 g/dL (ref 30.0–36.0)
MCV: 81.2 fL (ref 80.0–100.0)
Monocytes Absolute: 0.6 10*3/uL (ref 0.1–1.0)
Monocytes Relative: 17 %
Neutro Abs: 1.4 10*3/uL — ABNORMAL LOW (ref 1.7–7.7)
Neutrophils Relative %: 38 %
Platelet Count: 258 10*3/uL (ref 150–400)
RBC: 4.79 MIL/uL (ref 4.22–5.81)
RDW: 14.4 % (ref 11.5–15.5)
WBC Count: 3.7 10*3/uL — ABNORMAL LOW (ref 4.0–10.5)
nRBC: 0 % (ref 0.0–0.2)

## 2021-01-18 LAB — PROTIME-INR
INR: 2.2 — ABNORMAL HIGH (ref 0.8–1.2)
Prothrombin Time: 24.8 seconds — ABNORMAL HIGH (ref 11.4–15.2)

## 2021-02-07 ENCOUNTER — Ambulatory Visit
Admission: RE | Admit: 2021-02-07 | Discharge: 2021-02-07 | Disposition: A | Discharge status: Home / Self Care | Payer: BC Managed Care – PPO | Source: Ambulatory Visit | Attending: Interventional Radiology | Admitting: Interventional Radiology

## 2021-02-07 ENCOUNTER — Encounter: Payer: Self-pay | Admitting: *Deleted

## 2021-02-07 DIAGNOSIS — I81 Portal vein thrombosis: Secondary | ICD-10-CM

## 2021-02-07 HISTORY — PX: IR RADIOLOGIST EVAL & MGMT: IMG5224

## 2021-02-07 NOTE — Progress Notes (Signed)
Reason for follow up: 6 weeks status post trans-splenic portal vein and TIPS recanalization, telephone virtual visit  History of present illness: "65 year old non-cirrhotic male with history of portal thrombus and cavernous transformation status post TIPS creation and portal thrombectomy at outside facility in September 2021.  Failed attempt at TIPS recanalization in November 2021 by me.  He compensated well from a portal hypertension standpoint initially, but now has experienced increasing and persistent abdominal pain which is vague and difficult to determine etiology.  Endoscopic evaluation has been relatively unrevealing.     It is possible that his portal hypertension is causing his pain, but difficult to prove.  In the interim since last visit, he has had increased melena.  Some of this may be contributed to mildly elevated INR, however he was not technically supra-therapeutic.   After long discussion, Harry Lucero wishes to proceed with attempted portal vein and TIPS recanalization.  We dicussed the potential risks, benefits, and technical aspects of this procedure including general anesthesia, transplenic and venous access, and potential use of advanced recanalization techniques including sharp and laser recanalization.  Additionally, I emphasized that his abdominal pain may not improve, and we can't guarantee long term patency of the new stent(s), however staying on anticoagulation will help.  He is amenable to proceed in hopes that his abdominal pain and bleeding will resolve."   He underwent transsplenic portal venous recanalization with laser atherectomy assisted recanalization of the occluded TIPS on 12/25/20.  At his last visit, he complained of vague mid-abdominal pain.  He presents today for follow up.  His pain has significantly improved.  He has gone back to work.  Occasionally with lifting he will have a mild, transient pain in the upper abdomen.  No abdominal or extremity swelling.   No encephalopathy.  He remains compliant with coumadin.  Past Medical History:  Diagnosis Date   Antiphospholipid antibody syndrome (HCC)    Cough due to ACE inhibitor    clear mucous due to lisinopril   COVID 01/06-2020   asymptomatic   Headache    Hypertension    off lisinopril since 05-08-2020   Lupus anticoagulant positive    Portal vein thrombosis    Pre-diabetes    Prostate cancer ([place])    Wears glasses    for reading    Past Surgical History:  Procedure Laterality Date   CYSTOSCOPY N/A 05/31/2020   Procedure: CYSTOSCOPY FLEXIBLE;  Surgeon: Robley Fries, MD;  Location: [facility];  Service: Urology;  Laterality: N/A;  NO SEEDS FOUND BY DR. PACE   IR RADIOLOGIST EVAL & MGMT  01/03/2020   IR RADIOLOGIST EVAL & MGMT  04/18/2020   IR RADIOLOGIST EVAL & MGMT  10/23/2020   IR RADIOLOGIST EVAL & MGMT  11/21/2020   IR RADIOLOGIST EVAL & MGMT  01/10/2021   IR TIPS  11/02/2019   IR TIPS REVISION MOD SED  01/09/2020   IR TIPS REVISION MOD SED  12/25/2020   IR TRANSCATH PLC STENT  INITIAL VEIN  INC ANGIOPLASTY  12/25/2020   IR TRANSHEPATIC PORTOGRAM W HEMO  12/25/2020   IR US GUIDE VASC ACCESS LEFT  12/25/2020   PROSTATE BIOPSY     RADIOACTIVE SEED IMPLANT N/A 05/31/2020   Procedure: RADIOACTIVE SEED IMPLANT/BRACHYTHERAPY IMPLANT;  Surgeon: Robley Fries, MD;  Location: [place];  Service: Urology;  Laterality: N/A;  SEEDS IMPLANTED   RADIOLOGY WITH ANESTHESIA N/A 01/09/2020   Procedure: IR WITH ANESTHESIA TIPS REVISION;  Surgeon: Radiologist, Medication, MD;  Location: [place];  Service: Radiology;  Laterality: N/A;   RADIOLOGY WITH ANESTHESIA N/A 12/25/2020   Procedure: TIPS;  Surgeon: Suzette Battiest, MD;  Location: [place];  Service: Radiology;  Laterality: N/A;   TIPS PROCEDURE      Allergies: Lisinopril  Medications: Prior to Admission medications   Medication Sig Start Date End Date Taking? Authorizing Provider  albuterol (VENTOLIN HFA) 108  (90 Base) MCG/ACT inhaler Inhale 2 puffs into the lungs 2 (two) times daily as needed for shortness of breath. 12/14/20   [provider]  Blood Glucose Monitoring Suppl (CONTOUR NEXT EZ) w/Device KIT See admin instructions. 03/02/20   [provider]  CONTOUR NEXT TEST test strip daily. 03/01/20   [provider]  fluticasone (FLONASE) 50 MCG/ACT nasal spray Place 1 spray into both nostrils daily as needed for congestion. 09/30/20   [provider]  glipiZIDE (GLUCOTROL XL) 2.5 MG 24 hr tablet Take 2.5 mg by mouth daily. 02/22/20   [provider]  latanoprost (XALATAN) 0.005 % ophthalmic solution Place 1 drop into both eyes daily in the afternoon. 1500 12/10/20   [provider]  Microlet Lancets MISC daily. 03/01/20   [provider]  Multiple Vitamin (MULTIVITAMIN) tablet Take 1 tablet by mouth daily.    [provider]  omeprazole (PRILOSEC) 40 MG capsule Take 1 capsule (40 mg total) by mouth daily. Patient not taking: Reported on 12/19/2020 12/05/20   [name], MD  pravastatin (PRAVACHOL) 20 MG tablet Take 20 mg by mouth daily. 02/22/20   [provider]  sucralfate (CARAFATE) 1 g tablet Take 1 tablet (1 g total) by mouth 4 (four) times daily -  with meals and at bedtime. Patient not taking: Reported on 12/19/2020 12/05/20   [name], MD  traMADol (ULTRAM) 50 MG tablet Take 1 tablet (50 mg total) by mouth every 6 (six) hours as needed. 05/31/20 05/31/21  Robley Fries, MD  Vitamin D, Ergocalciferol, (DRISDOL) 1.25 MG (50000 UNIT) CAPS capsule Take 50,000 Units by mouth once a week. 06/10/20   [provider]  warfarin (COUMADIN) 5 MG tablet Takes 5 mg on saturday and sunday Patient taking differently: Take 5-10 mg by mouth See admin instructions. Takes 5 mg on Wednesday, Thursday Friday and Saturday all the other day take 10 mg 10/01/20   [name], MD  ranitidine (ZANTAC) 150 MG tablet Take 1  tablet (150 mg total) by mouth 2 (two) times daily. Patient not taking: Reported on 12/11/2019 03/19/18 12/11/19  Muthersbaugh, Jarrett Soho, PA-C     Family History  Problem Relation Age of Onset   Prostate cancer Neg Hx    Colon cancer Neg Hx    Pancreatic cancer Neg Hx    Breast cancer Neg Hx     Social History   Socioeconomic History   Marital status: Married    Spouse name: [name]   Number of children: 3   Years of education: Not on file   Highest education level: Not on file  Occupational History   Not on file  Tobacco Use   Smoking status: Former    Packs/day: 1.00    Years: 20.00    Pack years: 20.00    Types: Cigarettes    Quit date: 02/24/1994    Years since quitting: 26.9   Smokeless tobacco: Never  Vaping Use   Vaping Use: Never used  Substance and Sexual Activity   Alcohol use: No   Drug  use: No    Comment: hx of marijuana and crack 25 yrs ago last used   Sexual activity: Yes  Other Topics Concern   Not on file  Social History Narrative   Not on file   Social Determinants of Health   Financial Resource Strain: High Risk   Difficulty of Paying Living Expenses: Very hard  Food Insecurity: Food Insecurity Present   Worried About Charity fundraiser in the Last Year: Sometimes true   Ran Out of Food in the Last Year: Not on file  Transportation Needs: Not on file  Physical Activity: Not on file  Stress: Not on file  Social Connections: Not on file     Vital Signs: There were no vitals taken for this visit.  No physical examination in lieu of telephone virtual visit.  Imaging: No new pertinent imaging.  Labs: No new pertinent labs.   Assessment and Plan: 65 year old male with history of antiphospholipid syndrome who presented at an outside hospital in September 2021 with portal thrombus treat by thrombectomy from a TIPS approach.  The TIPS and portal vein were noted to be thrombosed shortly thereafter, and has since developed cavernous  transformation of the portal vein.     He is now status post trans-splenic portal vein recanalization, TIPS recanalization and relining with portal/splenic vein stent placement on 12/25/20.  This procedure was attempted in hopes of possibly improving his pain.  At 2 week follow up he had new, moderate to severe upper abdominal pain.  He presents today for 6 week follow up with significant improvement in pain.  No encephalopathy or signs of worsening portal hypertension.  We again discussed realistic expectations that the pain may never go away completely, and that his case if very complex.  At this time we will manage solely based on symptoms as obtaining follow up imaging would not necessarily be directive.   Plan to follow up in 6 months.  No imaging required at this time.  Please continue coumadin.  Electronically Signed: Suzette Battiest 02/07/2021, 1:40 PM   I spent a total of 25 Minutes in telephone clinical consultation, greater than 50% of which was counseling/coordinating care for portal vein occlusion.

## 2021-03-18 ENCOUNTER — Inpatient Hospital Stay: Payer: BC Managed Care – PPO | Attending: Hematology and Oncology

## 2021-03-18 ENCOUNTER — Other Ambulatory Visit: Payer: Self-pay

## 2021-03-18 DIAGNOSIS — C61 Malignant neoplasm of prostate: Secondary | ICD-10-CM | POA: Insufficient documentation

## 2021-03-18 DIAGNOSIS — Z79899 Other long term (current) drug therapy: Secondary | ICD-10-CM | POA: Insufficient documentation

## 2021-03-18 DIAGNOSIS — D6861 Antiphospholipid syndrome: Secondary | ICD-10-CM | POA: Diagnosis present

## 2021-03-18 DIAGNOSIS — I81 Portal vein thrombosis: Secondary | ICD-10-CM | POA: Insufficient documentation

## 2021-03-18 LAB — CBC WITH DIFFERENTIAL (CANCER CENTER ONLY)
Abs Immature Granulocytes: 0 10*3/uL (ref 0.00–0.07)
Basophils Absolute: 0 10*3/uL (ref 0.0–0.1)
Basophils Relative: 1 %
Eosinophils Absolute: 0.1 10*3/uL (ref 0.0–0.5)
Eosinophils Relative: 3 %
HCT: 37.5 % — ABNORMAL LOW (ref 39.0–52.0)
Hemoglobin: 12.8 g/dL — ABNORMAL LOW (ref 13.0–17.0)
Immature Granulocytes: 0 %
Lymphocytes Relative: 38 %
Lymphs Abs: 1.4 10*3/uL (ref 0.7–4.0)
MCH: 27.1 pg (ref 26.0–34.0)
MCHC: 34.1 g/dL (ref 30.0–36.0)
MCV: 79.3 fL — ABNORMAL LOW (ref 80.0–100.0)
Monocytes Absolute: 0.6 10*3/uL (ref 0.1–1.0)
Monocytes Relative: 16 %
Neutro Abs: 1.5 10*3/uL — ABNORMAL LOW (ref 1.7–7.7)
Neutrophils Relative %: 42 %
Platelet Count: 235 10*3/uL (ref 150–400)
RBC: 4.73 MIL/uL (ref 4.22–5.81)
RDW: 14.8 % (ref 11.5–15.5)
WBC Count: 3.6 10*3/uL — ABNORMAL LOW (ref 4.0–10.5)
nRBC: 0 % (ref 0.0–0.2)

## 2021-03-18 LAB — PROTIME-INR
INR: 1.9 — ABNORMAL HIGH (ref 0.8–1.2)
Prothrombin Time: 21.9 seconds — ABNORMAL HIGH (ref 11.4–15.2)

## 2021-03-22 ENCOUNTER — Inpatient Hospital Stay: Payer: BC Managed Care – PPO

## 2021-05-16 NOTE — Progress Notes (Signed)
? ?Patient Care Team: ?Simona Huh, NP as PCP - General (Nurse Practitioner) ?Cira Rue, RN as Oncology Nurse Navigator ? ?DIAGNOSIS:  ?Encounter Diagnosis  ?Name Primary?  ? Antiphospholipid antibody syndrome (HCC)   ? ? ?SUMMARY OF ONCOLOGIC HISTORY: ?Oncology History  ?Malignant neoplasm of prostate ([place])  ?01/30/2020 Cancer Staging  ? Staging form: Prostate, AJCC 8th Edition ?- Clinical stage from 01/30/2020: Stage IIB (cT1c, cN0, cM0, PSA: 10, Grade Group: 2) - Signed by Freeman Caldron, PA-C on 03/27/2020 ?Histopathologic type: Adenocarcinoma, NOS ?Stage prefix: Initial diagnosis ?Prostate specific antigen (PSA) range: 10 to 19 ?Gleason primary pattern: 3 ?Gleason secondary pattern: 4 ?Gleason score: 7 ?Histologic grading system: 5 grade system ?Number of biopsy cores examined: 12 ?Number of biopsy cores positive: 3 ?Location of positive needle core biopsies: Both sides ? ?  ?03/27/2020 Initial Diagnosis  ? Malignant neoplasm of prostate ([place]) ?  ? ? ?CHIEF COMPLIANT: Persistent and severe abdominal pain ? ?INTERVAL HISTORY: Harry Lucero is a  66 year old with above-mentioned history of portal vein thrombosis and a stent placement in [place] who has been on Coumadin for the antiphospholipid antibody syndrome.  He has had chronic mild abdominal pain but recently over the past couple of days his pain has gotten markedly worse and he went to the emergency room.  In the emergency room he had another CT of the abdomen and pelvis which revealed (persistent chronic thrombus involving the portal vein as well as the stent.  There were a bunch of blood vessels in the portal zone related to the obstruction.  He does not want to take any pain medication and he wants to find out what can be done to resolve his pain. He presents to the clinic today for a follow-up. ?He states that the surgery went well. He complains of the pain is still there. He complains of blood being in his stool over the last 5 days. ? ? ?ALLERGIES:   is allergic to lisinopril. ? ?MEDICATIONS:  ?Current Outpatient Medications  ?Medication Sig Dispense Refill  ? albuterol (VENTOLIN HFA) 108 (90 Base) MCG/ACT inhaler Inhale 2 puffs into the lungs 2 (two) times daily as needed for shortness of breath.    ? Blood Glucose Monitoring Suppl (CONTOUR NEXT EZ) w/Device KIT See admin instructions.    ? CONTOUR NEXT TEST test strip daily.    ? fluticasone (FLONASE) 50 MCG/ACT nasal spray Place 1 spray into both nostrils daily as needed for congestion.    ? glipiZIDE (GLUCOTROL XL) 2.5 MG 24 hr tablet Take 2.5 mg by mouth daily.    ? latanoprost (XALATAN) 0.005 % ophthalmic solution Place 1 drop into both eyes daily in the afternoon. 1500    ? Microlet Lancets MISC daily.    ? Multiple Vitamin (MULTIVITAMIN) tablet Take 1 tablet by mouth daily.    ? omeprazole (PRILOSEC) 40 MG capsule Take 1 capsule (40 mg total) by mouth daily. (Patient not taking: Reported on 12/19/2020) 30 capsule 0  ? pravastatin (PRAVACHOL) 20 MG tablet Take 20 mg by mouth daily.    ? sucralfate (CARAFATE) 1 g tablet Take 1 tablet (1 g total) by mouth 4 (four) times daily -  with meals and at bedtime. (Patient not taking: Reported on 12/19/2020) 90 tablet 0  ? traMADol (ULTRAM) 50 MG tablet Take 1 tablet (50 mg total) by mouth every 6 (six) hours as needed. 20 tablet 0  ? Vitamin D, Ergocalciferol, (DRISDOL) 1.25 MG (50000 UNIT) CAPS capsule Take 50,000 Units by mouth  once a week.    ? warfarin (COUMADIN) 5 MG tablet Take 1-2 tablets (5-10 mg total) by mouth See admin instructions. Takes 5 mg on Wednesday, Thursday Friday and Saturday all the other day take 10 mg 120 tablet 3  ? ?No current facility-administered medications for this visit.  ? ? ?PHYSICAL EXAMINATION: ?ECOG PERFORMANCE STATUS: 1 - Symptomatic but completely ambulatory ? ?Vitals:  ? 05/17/21 1136  ?BP: 133/82  ?Pulse: 77  ?Resp: 18  ?Temp: (!) 97.3 ?F (36.3 ?C)  ?SpO2: 97%  ? ?Filed Weights  ? 05/17/21 1136  ?Weight: 210 lb 3.2 oz (95.3  kg)  ? ?  ? ?LABORATORY DATA:  ?I have reviewed the data as listed ? ?  Latest Ref Rng & Units 12/26/2020  ?  1:36 AM 12/05/2020  ?  3:55 AM 11/16/2020  ?  8:10 AM  ?CMP  ?Glucose 70 - 99 mg/dL 114   95   98    ?BUN 8 - 23 mg/dL _0 ?Creatinine 0.61 - 1.24 mg/dL 1.23   1.20   1.17    ?Sodium 135 - 145 mmol/L 137   139   141    ?Potassium 3.5 - 5.1 mmol/L 4.7   4.4   4.2    ?Chloride 98 - 111 mmol/L 105   105   109    ?CO2 22 - 32 mmol/L _1 ?Calcium 8.9 - 10.3 mg/dL 9.1   9.4   8.8    ?Total Protein 6.5 - 8.1 g/dL 6.4   6.8   7.3    ?Total Bilirubin 0.3 - 1.2 mg/dL 1.6   1.1   0.8    ?Alkaline Phos 38 - 126 U/L 45   60   78    ?AST 15 - 41 U/L 55   39   48    ?ALT 0 - 44 U/L 42   56   58    ? ? ?Lab Results  ?Component Value Date  ? WBC 3.0 (L) 05/17/2021  ? HGB 13.0 05/17/2021  ? HCT 39.3 05/17/2021  ? MCV 80.9 05/17/2021  ? PLT 273 05/17/2021  ? NEUTROABS 1.5 (L) 05/17/2021  ? ? 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  ?Current treatment: Switched to Coumadin 01/09/2020 (when he was diagnosed with positive lupus anticoagulant) ?Coumadin toxicities: ?GI bleeding for the past 5 days: Patient is seeing blood clots and dark-colored stools.  We will call Dr. Hildred Alamin office [facility] gastroenterology) to see if he can be seen by her next week.  Today's hemoglobin is 13 g.  We will hold Coumadin for next 2 days. ?We are waiting for today's INR. ?  ?Hospitalization:  ?01/09/2020-01/10/2020 attempted TIPS recannulization procedure in IR, unsuccessful ?12/25/2020-12/26/2020: Successful balloon assisted sharp recannulization of the portal vein, balloon angioplasty of the portal vein, TIPS endograft relining/stent placement, portal and splenic vein stent placement and coil embolization of splenic  parenchyma with access tract ?Abdominal pain: 7-8 out of 10 still persistent in spite of the surgical procedures. ? ?Continue with Coumadin therapy with close monitoring. ?Return to clinic every 2 months with labs for INR and every 6 months for follow-up with me. ? ? ?No orders of the defined types were placed in this encounter. ? ?  The patient has a good understanding of the overall plan. he agrees with it. he will call with any problems that may develop before the next visit here. ?Total time spent: 30 mins including face to face time and time spent for planning, charting and co-ordination of care ? ? Harriette Ohara, MD ?05/17/21 ? ? ? I Gardiner Coins am scribing for Dr. Lindi Adie ? ?I have reviewed the above documentation for accuracy and completeness, and I agree with the above. ?  ?

## 2021-05-17 ENCOUNTER — Encounter: Payer: Self-pay | Admitting: *Deleted

## 2021-05-17 ENCOUNTER — Inpatient Hospital Stay (HOSPITAL_BASED_OUTPATIENT_CLINIC_OR_DEPARTMENT_OTHER): Payer: BC Managed Care – PPO | Admitting: Hematology and Oncology

## 2021-05-17 ENCOUNTER — Inpatient Hospital Stay: Payer: BC Managed Care – PPO | Attending: Hematology and Oncology

## 2021-05-17 ENCOUNTER — Other Ambulatory Visit: Payer: Self-pay

## 2021-05-17 DIAGNOSIS — Z79899 Other long term (current) drug therapy: Secondary | ICD-10-CM | POA: Diagnosis not present

## 2021-05-17 DIAGNOSIS — I81 Portal vein thrombosis: Secondary | ICD-10-CM | POA: Diagnosis present

## 2021-05-17 DIAGNOSIS — D6861 Antiphospholipid syndrome: Secondary | ICD-10-CM | POA: Insufficient documentation

## 2021-05-17 DIAGNOSIS — C61 Malignant neoplasm of prostate: Secondary | ICD-10-CM | POA: Insufficient documentation

## 2021-05-17 LAB — CBC WITH DIFFERENTIAL (CANCER CENTER ONLY)
Abs Immature Granulocytes: 0 10*3/uL (ref 0.00–0.07)
Basophils Absolute: 0 10*3/uL (ref 0.0–0.1)
Basophils Relative: 1 %
Eosinophils Absolute: 0 10*3/uL (ref 0.0–0.5)
Eosinophils Relative: 1 %
HCT: 39.3 % (ref 39.0–52.0)
Hemoglobin: 13 g/dL (ref 13.0–17.0)
Immature Granulocytes: 0 %
Lymphocytes Relative: 39 %
Lymphs Abs: 1.2 10*3/uL (ref 0.7–4.0)
MCH: 26.7 pg (ref 26.0–34.0)
MCHC: 33.1 g/dL (ref 30.0–36.0)
MCV: 80.9 fL (ref 80.0–100.0)
Monocytes Absolute: 0.3 10*3/uL (ref 0.1–1.0)
Monocytes Relative: 10 %
Neutro Abs: 1.5 10*3/uL — ABNORMAL LOW (ref 1.7–7.7)
Neutrophils Relative %: 49 %
Platelet Count: 273 10*3/uL (ref 150–400)
RBC: 4.86 MIL/uL (ref 4.22–5.81)
RDW: 14.4 % (ref 11.5–15.5)
WBC Count: 3 10*3/uL — ABNORMAL LOW (ref 4.0–10.5)
nRBC: 0 % (ref 0.0–0.2)

## 2021-05-17 LAB — PROTIME-INR
INR: 2.3 — ABNORMAL HIGH (ref 0.8–1.2)
Prothrombin Time: 25.7 seconds — ABNORMAL HIGH (ref 11.4–15.2)

## 2021-05-17 MED ORDER — WARFARIN SODIUM 5 MG PO TABS: 5.0000 mg | ORAL_TABLET | ORAL | 3 refills | Status: DC

## 2021-05-17 NOTE — Assessment & Plan Note (Signed)
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 ?Current treatment:?Switched to Coumadin 01/09/2020 (when he was diagnosed with positive lupus anticoagulant) ?? ?Hospitalization:? ?01/09/2020-01/10/2020 attempted?TIPS?recannulization procedure in IR, unsuccessful ?12/25/2020-12/26/2020: Successful balloon assisted sharp recannulization of the portal vein, balloon angioplasty of the portal vein, TIPS endograft relining/stent placement, portal and splenic vein stent placement and coil embolization of splenic parenchyma with access tract ? ?Continue with Coumadin therapy with close monitoring. ?

## 2021-05-17 NOTE — Progress Notes (Signed)
Per MD request RN placed call to Dr. Alan Ripper with [facility] [phone]) and scheduled first available f/u for evaluation of dark colored stools x5 days.  Appt scheduled for Monday 05/20/21 at 1:45 pm.  Pt notified and verbalized understanding.  ?

## 2021-06-27 ENCOUNTER — Other Ambulatory Visit: Payer: Self-pay | Admitting: Interventional Radiology

## 2021-06-27 DIAGNOSIS — I81 Portal vein thrombosis: Secondary | ICD-10-CM

## 2021-07-17 ENCOUNTER — Inpatient Hospital Stay: Payer: BC Managed Care – PPO | Attending: Hematology and Oncology

## 2021-07-24 ENCOUNTER — Encounter: Payer: Self-pay | Admitting: Interventional Radiology

## 2021-09-16 ENCOUNTER — Inpatient Hospital Stay: Payer: BC Managed Care – PPO | Attending: Hematology and Oncology

## 2021-09-16 ENCOUNTER — Telehealth: Payer: Self-pay | Admitting: *Deleted

## 2021-09-16 ENCOUNTER — Other Ambulatory Visit: Payer: Self-pay

## 2021-09-16 DIAGNOSIS — D6861 Antiphospholipid syndrome: Secondary | ICD-10-CM | POA: Insufficient documentation

## 2021-09-16 DIAGNOSIS — C61 Malignant neoplasm of prostate: Secondary | ICD-10-CM | POA: Diagnosis present

## 2021-09-16 DIAGNOSIS — I81 Portal vein thrombosis: Secondary | ICD-10-CM | POA: Insufficient documentation

## 2021-09-16 LAB — CBC WITH DIFFERENTIAL (CANCER CENTER ONLY)
Abs Immature Granulocytes: 0.01 10*3/uL (ref 0.00–0.07)
Basophils Absolute: 0 10*3/uL (ref 0.0–0.1)
Basophils Relative: 1 %
Eosinophils Absolute: 0.1 10*3/uL (ref 0.0–0.5)
Eosinophils Relative: 2 %
HCT: 40.2 % (ref 39.0–52.0)
Hemoglobin: 13.4 g/dL (ref 13.0–17.0)
Immature Granulocytes: 0 %
Lymphocytes Relative: 35 %
Lymphs Abs: 1.2 10*3/uL (ref 0.7–4.0)
MCH: 27.2 pg (ref 26.0–34.0)
MCHC: 33.3 g/dL (ref 30.0–36.0)
MCV: 81.5 fL (ref 80.0–100.0)
Monocytes Absolute: 0.5 10*3/uL (ref 0.1–1.0)
Monocytes Relative: 14 %
Neutro Abs: 1.6 10*3/uL — ABNORMAL LOW (ref 1.7–7.7)
Neutrophils Relative %: 48 %
Platelet Count: 221 10*3/uL (ref 150–400)
RBC: 4.93 MIL/uL (ref 4.22–5.81)
RDW: 15 % (ref 11.5–15.5)
WBC Count: 3.4 10*3/uL — ABNORMAL LOW (ref 4.0–10.5)
nRBC: 0 % (ref 0.0–0.2)

## 2021-09-16 LAB — PROTIME-INR
INR: 2.3 — ABNORMAL HIGH (ref 0.8–1.2)
Prothrombin Time: 24.9 seconds — ABNORMAL HIGH (ref 11.4–15.2)

## 2021-09-16 MED ORDER — WARFARIN SODIUM 5 MG PO TABS: 5.0000 mg | ORAL_TABLET | ORAL | 3 refills | Status: DC

## 2021-09-16 NOTE — Telephone Encounter (Signed)
Pt INR 2.3.  Pt states he is currently taking Warfarin 10 mg Sunday, Monday, Tuesday and Warfarin 5 mg Wednesday, Thursday, Friday, Saturday.  Per MD pt needing to continue with that medication plan and re check labs in 2 months.  Pt states he currently does not have medical coverage.  RN educated pt on [place] savings card for warfarin and educated pt to keep our office updated on coverage.  Labs will still need to be recollected in 2 months but we could push out MD f/u if no coverage.  Pt verbalized understanding and appreciative of advice.

## 2021-11-04 ENCOUNTER — Telehealth: Payer: Self-pay | Admitting: Hematology and Oncology

## 2021-11-04 NOTE — Telephone Encounter (Signed)
Rescheduled appointment per provider on call. Left voicemail.  

## 2021-11-18 ENCOUNTER — Inpatient Hospital Stay: Payer: PPO

## 2021-11-18 ENCOUNTER — Inpatient Hospital Stay: Payer: PPO | Admitting: Hematology and Oncology

## 2021-11-21 NOTE — Progress Notes (Signed)
Patient Care Team: Simona Huh, NP as PCP - General (Nurse Practitioner) Cira Rue, RN as Oncology Nurse Navigator  DIAGNOSIS:  Encounter Diagnoses  Name Primary?   VTE (venous thromboembolism) Yes   Antiphospholipid antibody syndrome ([place])     SUMMARY OF ONCOLOGIC HISTORY: Oncology History  Malignant neoplasm of prostate ([place])  01/30/2020 Cancer Staging   Staging form: Prostate, AJCC 8th Edition - Clinical stage from 01/30/2020: Stage IIB (cT1c, cN0, cM0, PSA: 10, Grade Group: 2) - Signed by Freeman Caldron, PA-C on 03/27/2020 Histopathologic type: Adenocarcinoma, NOS Stage prefix: Initial diagnosis Prostate specific antigen (PSA) range: 10 to 19 Gleason primary pattern: 3 Gleason secondary pattern: 4 Gleason score: 7 Histologic grading system: 5 grade system Number of biopsy cores examined: 12 Number of biopsy cores positive: 3 Location of positive needle core biopsies: Both sides   03/27/2020 Initial Diagnosis   Malignant neoplasm of prostate ([place])     CHIEF COMPLIANT: Follow-up thrombosis  INTERVAL HISTORY: Harry Lucero is a  66 -year-old with above-mentioned history of portal vein thrombosis. He presents to the clinic for labs and follow-up. He states that he seems to be doing o.k. Denies chronic pain issues. His main concern is his blood slowly clotting.   ALLERGIES:  is allergic to lisinopril.  MEDICATIONS:  Current Outpatient Medications  Medication Sig Dispense Refill   albuterol (VENTOLIN HFA) 108 (90 Base) MCG/ACT inhaler Inhale 2 puffs into the lungs 2 (two) times daily as needed for shortness of breath.     Blood Glucose Monitoring Suppl (CONTOUR NEXT EZ) w/Device KIT See admin instructions.     CONTOUR NEXT TEST test strip daily.     fluticasone (FLONASE) 50 MCG/ACT nasal spray Place 1 spray into both nostrils daily as needed for congestion.     glipiZIDE (GLUCOTROL XL) 2.5 MG 24 hr tablet Take 2.5 mg by mouth daily.     latanoprost (XALATAN) 0.005 %  ophthalmic solution Place 1 drop into both eyes daily in the afternoon. 1500     Microlet Lancets MISC daily.     Multiple Vitamin (MULTIVITAMIN) tablet Take 1 tablet by mouth daily.     omeprazole (PRILOSEC) 40 MG capsule Take 1 capsule (40 mg total) by mouth daily. (Patient not taking: Reported on 12/19/2020) 30 capsule 0   pravastatin (PRAVACHOL) 20 MG tablet Take 20 mg by mouth daily.     sucralfate (CARAFATE) 1 g tablet Take 1 tablet (1 g total) by mouth 4 (four) times daily -  with meals and at bedtime. (Patient not taking: Reported on 12/19/2020) 90 tablet 0   tamsulosin (FLOMAX) 0.4 MG CAPS capsule Take 0.4 mg by mouth at bedtime.     Vitamin D, Ergocalciferol, (DRISDOL) 1.25 MG (50000 UNIT) CAPS capsule Take 50,000 Units by mouth once a week.     warfarin (COUMADIN) 5 MG tablet Take 1-2 tablets (5-10 mg total) by mouth See admin instructions. Takes 5 mg on Wednesday, Thursday Friday and Saturday all the other day take 10 mg 120 tablet 3   No current facility-administered medications for this visit.    PHYSICAL EXAMINATION: ECOG PERFORMANCE STATUS: 1 - Symptomatic but completely ambulatory  Vitals:   11/25/21 1440  BP: (!) 152/81  Pulse: 66  Resp: 18  Temp: 97.8 F (36.6 C)  SpO2: 100%   Filed Weights   11/25/21 1440  Weight: 209 lb 1.6 oz (94.8 kg)      LABORATORY DATA:  I have reviewed the data as listed  Latest Ref Rng & Units 12/26/2020    1:36 AM 12/05/2020    3:55 AM 11/16/2020    8:10 AM  CMP  Glucose 70 - 99 mg/dL 114  95  98   BUN 8 - 23 mg/dL _0 Creatinine 0.61 - 1.24 mg/dL 1.23  1.20  1.17   Sodium 135 - 145 mmol/L 137  139  141   Potassium 3.5 - 5.1 mmol/L 4.7  4.4  4.2   Chloride 98 - 111 mmol/L 105  105  109   CO2 22 - 32 mmol/L _1 Calcium 8.9 - 10.3 mg/dL 9.1  9.4  8.8   Total Protein 6.5 - 8.1 g/dL 6.4  6.8  7.3   Total Bilirubin 0.3 - 1.2 mg/dL 1.6  1.1  0.8   Alkaline Phos 38 - 126 U/L 45  60  78   AST 15 - 41 U/L 55  39   48   ALT 0 - 44 U/L 42  56  58     Lab Results  Component Value Date   WBC 3.6 (L) 11/25/2021   HGB 13.2 11/25/2021   HCT 39.2 11/25/2021   MCV 81.2 11/25/2021   PLT 234 11/25/2021   NEUTROABS 1.7 11/25/2021    ASSESSMENT & PLAN:  Antiphospholipid antibody syndrome [facility]) 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 at [place] when he presented with abdominal pain (positive for lupus anticoagulant) SMV thrombosis Splenic vein thrombosis   Current treatment: Switched to Coumadin 01/09/2020 (when he was diagnosed with positive lupus anticoagulant) Coumadin toxicities:  GI bleeding 05/17/2021: Today's hemoglobin is 13.2  Hospitalization:  01/09/2020-01/10/2020 attempted TIPS recannulization procedure in IR, unsuccessful 12/25/2020-12/26/2020: Successful balloon assisted sharp recannulization of the portal vein, balloon angioplasty of the portal vein, TIPS endograft relining/stent placement, portal and splenic vein stent placement and coil embolization of splenic parenchyma with access tract  Return to clinic every 2 months for labs and in 12 months for follow-up with me  No orders of the defined types were placed in this encounter.  The patient has a good understanding of the overall plan. he agrees with it. he will call with any problems that may develop before the next visit here. Total time spent: 20 mins including face to face time and time spent for planning, charting and co-ordination of care   Harriette Ohara, MD 11/25/21    I Gardiner Coins am scribing for Dr. Lindi Adie  I have reviewed the above documentation for accuracy and completeness, and I agree with the above.

## 2021-11-22 ENCOUNTER — Other Ambulatory Visit: Payer: Self-pay | Admitting: *Deleted

## 2021-11-22 DIAGNOSIS — D6861 Antiphospholipid syndrome: Secondary | ICD-10-CM

## 2021-11-25 ENCOUNTER — Inpatient Hospital Stay: Payer: PPO

## 2021-11-25 ENCOUNTER — Inpatient Hospital Stay: Payer: PPO | Attending: Hematology and Oncology | Admitting: Hematology and Oncology

## 2021-11-25 ENCOUNTER — Other Ambulatory Visit: Payer: Self-pay

## 2021-11-25 VITALS — BP 152/81 | HR 66 | Temp 97.8°F | Resp 18 | Ht 68.0 in | Wt 209.1 lb

## 2021-11-25 DIAGNOSIS — D6861 Antiphospholipid syndrome: Secondary | ICD-10-CM

## 2021-11-25 DIAGNOSIS — C61 Malignant neoplasm of prostate: Secondary | ICD-10-CM | POA: Insufficient documentation

## 2021-11-25 DIAGNOSIS — Z79899 Other long term (current) drug therapy: Secondary | ICD-10-CM | POA: Insufficient documentation

## 2021-11-25 DIAGNOSIS — I81 Portal vein thrombosis: Secondary | ICD-10-CM | POA: Insufficient documentation

## 2021-11-25 DIAGNOSIS — I8290 Acute embolism and thrombosis of unspecified vein: Secondary | ICD-10-CM

## 2021-11-25 LAB — CBC WITH DIFFERENTIAL (CANCER CENTER ONLY)
Abs Immature Granulocytes: 0 10*3/uL (ref 0.00–0.07)
Basophils Absolute: 0 10*3/uL (ref 0.0–0.1)
Basophils Relative: 1 %
Eosinophils Absolute: 0 10*3/uL (ref 0.0–0.5)
Eosinophils Relative: 1 %
HCT: 39.2 % (ref 39.0–52.0)
Hemoglobin: 13.2 g/dL (ref 13.0–17.0)
Immature Granulocytes: 0 %
Lymphocytes Relative: 38 %
Lymphs Abs: 1.4 10*3/uL (ref 0.7–4.0)
MCH: 27.3 pg (ref 26.0–34.0)
MCHC: 33.7 g/dL (ref 30.0–36.0)
MCV: 81.2 fL (ref 80.0–100.0)
Monocytes Absolute: 0.5 10*3/uL (ref 0.1–1.0)
Monocytes Relative: 13 %
Neutro Abs: 1.7 10*3/uL (ref 1.7–7.7)
Neutrophils Relative %: 47 %
Platelet Count: 234 10*3/uL (ref 150–400)
RBC: 4.83 MIL/uL (ref 4.22–5.81)
RDW: 15.3 % (ref 11.5–15.5)
WBC Count: 3.6 10*3/uL — ABNORMAL LOW (ref 4.0–10.5)
nRBC: 0 % (ref 0.0–0.2)

## 2021-11-25 LAB — PROTIME-INR
INR: 2.1 — ABNORMAL HIGH (ref 0.8–1.2)
Prothrombin Time: 23 seconds — ABNORMAL HIGH (ref 11.4–15.2)

## 2021-11-25 NOTE — Assessment & Plan Note (Addendum)
Portal vein thrombosisdiagnosed September 2021 at [facility] when he presented with abdominal pain(positive for lupus anticoagulant) SMV thrombosis Splenic vein thrombosis  Current treatment:Switched to Coumadin 01/09/2020 (when he was diagnosed with positive lupus anticoagulant) Coumadin toxicities: 1.  GI bleeding 05/17/2021:   Hospitalization: 01/09/2020-01/10/2020 attemptedTIPSrecannulization procedure in IR, unsuccessful 12/25/2020-12/26/2020: Successful balloon assisted sharp recannulization of the portal vein, balloon angioplasty of the portal vein, TIPS endograft relining/stent placement, portal and splenic vein stent placement and coil embolization of splenic parenchyma with access tract  Return to clinic every 2 months for labs and in 12 months for follow-up with me

## 2021-11-27 ENCOUNTER — Ambulatory Visit: Payer: BC Managed Care – PPO | Admitting: Podiatry

## 2021-12-11 ENCOUNTER — Ambulatory Visit: Payer: PPO | Admitting: Podiatry

## 2022-01-02 ENCOUNTER — Ambulatory Visit (INDEPENDENT_AMBULATORY_CARE_PROVIDER_SITE_OTHER): Payer: PPO | Admitting: Podiatry

## 2022-01-02 DIAGNOSIS — B351 Tinea unguium: Secondary | ICD-10-CM | POA: Diagnosis not present

## 2022-01-02 DIAGNOSIS — Z79899 Other long term (current) drug therapy: Secondary | ICD-10-CM | POA: Diagnosis not present

## 2022-01-02 LAB — HEPATIC FUNCTION PANEL
AG Ratio: 1.3 (calc) (ref 1.0–2.5)
ALT: 31 U/L (ref 9–46)
AST: 29 U/L (ref 10–35)
Albumin: 4 g/dL (ref 3.6–5.1)
Alkaline phosphatase (APISO): 53 U/L (ref 35–144)
Bilirubin, Direct: 0.2 mg/dL (ref 0.0–0.2)
Globulin: 3 g/dL (calc) (ref 1.9–3.7)
Indirect Bilirubin: 0.8 mg/dL (calc) (ref 0.2–1.2)
Total Bilirubin: 1 mg/dL (ref 0.2–1.2)
Total Protein: 7 g/dL (ref 6.1–8.1)

## 2022-01-02 NOTE — Progress Notes (Signed)
Subjective:  Patient ID: Harry Lucero, male    DOB: 29-Oct-1955,  MRN: [id number]  Chief Complaint  Patient presents with   Nail Problem    Nail fungus    66 y.o. male presents with the above complaint.  Patient presents with right second digit thickened elongated dystrophic toenails x1.  Patient would like to discuss treatment options for it.  He has tried over-the-counter stuff none of which has helped.  He would like to discuss oral medication and denies any other acute complaints.   Review of Systems: Negative except as noted in the HPI. Denies N/V/F/Ch.  Past Medical History:  Diagnosis Date   Antiphospholipid antibody syndrome (HCC)    Cough due to ACE inhibitor    clear mucous due to lisinopril   COVID 01/06-2020   asymptomatic   Headache    Hypertension    off lisinopril since 05-08-2020   Lupus anticoagulant positive    Portal vein thrombosis    Pre-diabetes    Prostate cancer ([place])    Wears glasses    for reading    Current Outpatient Medications:    albuterol (VENTOLIN HFA) 108 (90 Base) MCG/ACT inhaler, Inhale 2 puffs into the lungs 2 (two) times daily as needed for shortness of breath., Disp: , Rfl:    Blood Glucose Monitoring Suppl (CONTOUR NEXT EZ) w/Device KIT, See admin instructions., Disp: , Rfl:    CONTOUR NEXT TEST test strip, daily., Disp: , Rfl:    fluticasone (FLONASE) 50 MCG/ACT nasal spray, Place 1 spray into both nostrils daily as needed for congestion., Disp: , Rfl:    glipiZIDE (GLUCOTROL XL) 2.5 MG 24 hr tablet, Take 2.5 mg by mouth daily., Disp: , Rfl:    latanoprost (XALATAN) 0.005 % ophthalmic solution, Place 1 drop into both eyes daily in the afternoon. 1500, Disp: , Rfl:    Microlet Lancets MISC, daily., Disp: , Rfl:    Multiple Vitamin (MULTIVITAMIN) tablet, Take 1 tablet by mouth daily., Disp: , Rfl:    omeprazole (PRILOSEC) 40 MG capsule, Take 1 capsule (40 mg total) by mouth daily. (Patient not taking: Reported on 12/19/2020), Disp: 30  capsule, Rfl: 0   pravastatin (PRAVACHOL) 20 MG tablet, Take 20 mg by mouth daily., Disp: , Rfl:    sucralfate (CARAFATE) 1 g tablet, Take 1 tablet (1 g total) by mouth 4 (four) times daily -  with meals and at bedtime. (Patient not taking: Reported on 12/19/2020), Disp: 90 tablet, Rfl: 0   tamsulosin (FLOMAX) 0.4 MG CAPS capsule, Take 0.4 mg by mouth at bedtime., Disp: , Rfl:    Vitamin D, Ergocalciferol, (DRISDOL) 1.25 MG (50000 UNIT) CAPS capsule, Take 50,000 Units by mouth once a week., Disp: , Rfl:    warfarin (COUMADIN) 5 MG tablet, Take 1-2 tablets (5-10 mg total) by mouth See admin instructions. Takes 5 mg on Wednesday, Thursday Friday and Saturday all the other day take 10 mg, Disp: 120 tablet, Rfl: 3  Social History   Tobacco Use  Smoking Status Former   Packs/day: 1.00   Years: 20.00   Total pack years: 20.00   Types: Cigarettes   Quit date: 02/24/1994   Years since quitting: 27.8  Smokeless Tobacco Never    Allergies  Allergen Reactions   Lisinopril     cough   Objective:  There were no vitals filed for this visit. There is no height or weight on file to calculate BMI. Constitutional Well developed. Well nourished.  Vascular Dorsalis pedis pulses palpable  bilaterally. Posterior tibial pulses palpable bilaterally. Capillary refill normal to all digits.  No cyanosis or clubbing noted. Pedal hair growth normal.  Neurologic Normal speech. Oriented to person, place, and time. Epicritic sensation to light touch grossly present bilaterally.  Dermatologic Right second digit thickened elongated dystrophic toenails x1.  Mild pain on palpation.  Orthopedic: Normal joint ROM without pain or crepitus bilaterally. No visible deformities. No bony tenderness.   Radiographs: None Assessment:  No diagnosis found. Plan:  Patient was evaluated and treated and all questions answered.  Right second digit onychomycosis -Educated the patient on the etiology of onychomycosis and  various treatment options associated with improving the fungal load.  I explained to the patient that there is 3 treatment options available to treat the onychomycosis including topical, p.o., laser treatment.  Patient elected to undergo p.o. options with Lamisil/terbinafine therapy.  In order for me to start the medication therapy, I explained to the patient the importance of evaluating the liver and obtaining the liver function test.  Once the liver function test comes back normal I will start him on 108-monthcourse of Lamisil therapy.  Patient understood all risk and would like to proceed with Lamisil therapy.  I have asked the patient to immediately stop the Lamisil therapy if she has any reactions to it and call the office or go to the emergency room right away.  Patient states understanding   No follow-ups on file.

## 2022-01-03 MED ORDER — TERBINAFINE HCL 250 MG PO TABS: 250.0000 mg | ORAL_TABLET | Freq: Every day | ORAL | 0 refills | Status: DC

## 2022-01-03 NOTE — Addendum Note (Signed)
Addended by: Boneta Lucks on: 01/03/2022 08:08 AM   Modules accepted: Orders

## 2022-01-21 ENCOUNTER — Telehealth: Payer: Self-pay

## 2022-01-22 NOTE — Telephone Encounter (Signed)
Message was routed to appropriate staff to address 

## 2022-01-23 ENCOUNTER — Telehealth: Payer: Self-pay | Admitting: Podiatry

## 2022-01-23 NOTE — Telephone Encounter (Signed)
Pt wife called asking about an update on results said she's been waiting for a call and asking about RX. I inform wife r/x was sent in this morning.  Please advise

## 2022-01-24 ENCOUNTER — Inpatient Hospital Stay: Payer: PPO | Attending: Hematology and Oncology

## 2022-01-24 DIAGNOSIS — I81 Portal vein thrombosis: Secondary | ICD-10-CM | POA: Diagnosis present

## 2022-01-24 DIAGNOSIS — C61 Malignant neoplasm of prostate: Secondary | ICD-10-CM | POA: Insufficient documentation

## 2022-01-24 DIAGNOSIS — D6861 Antiphospholipid syndrome: Secondary | ICD-10-CM | POA: Insufficient documentation

## 2022-01-24 LAB — CBC WITH DIFFERENTIAL (CANCER CENTER ONLY)
Abs Immature Granulocytes: 0.01 10*3/uL (ref 0.00–0.07)
Basophils Absolute: 0 10*3/uL (ref 0.0–0.1)
Basophils Relative: 1 %
Eosinophils Absolute: 0.1 10*3/uL (ref 0.0–0.5)
Eosinophils Relative: 2 %
HCT: 38.6 % — ABNORMAL LOW (ref 39.0–52.0)
Hemoglobin: 13 g/dL (ref 13.0–17.0)
Immature Granulocytes: 0 %
Lymphocytes Relative: 38 %
Lymphs Abs: 1.3 10*3/uL (ref 0.7–4.0)
MCH: 27.6 pg (ref 26.0–34.0)
MCHC: 33.7 g/dL (ref 30.0–36.0)
MCV: 82 fL (ref 80.0–100.0)
Monocytes Absolute: 0.5 10*3/uL (ref 0.1–1.0)
Monocytes Relative: 15 %
Neutro Abs: 1.5 10*3/uL — ABNORMAL LOW (ref 1.7–7.7)
Neutrophils Relative %: 44 %
Platelet Count: 228 10*3/uL (ref 150–400)
RBC: 4.71 MIL/uL (ref 4.22–5.81)
RDW: 15 % (ref 11.5–15.5)
WBC Count: 3.5 10*3/uL — ABNORMAL LOW (ref 4.0–10.5)
nRBC: 0 % (ref 0.0–0.2)

## 2022-01-24 LAB — PROTIME-INR
INR: 2.6 — ABNORMAL HIGH (ref 0.8–1.2)
Prothrombin Time: 28 seconds — ABNORMAL HIGH (ref 11.4–15.2)

## 2022-01-31 ENCOUNTER — Other Ambulatory Visit: Payer: PPO

## 2022-01-31 ENCOUNTER — Telehealth: Payer: Self-pay | Admitting: *Deleted

## 2022-01-31 NOTE — Progress Notes (Incomplete)
Reason for follow up: 12 months status post trans-splenic portal vein and TIPS recanalization, telephone virtual visit, new pain   History of present illness: "66 year old non-cirrhotic male with history of portal thrombus and cavernous transformation status post TIPS creation and portal thrombectomy at outside facility in September 2021.  Failed attempt at TIPS recanalization in November 2021 by me.  He compensated well from a portal hypertension standpoint initially, but now has experienced increasing and persistent abdominal pain which is vague and difficult to determine etiology.  Endoscopic evaluation has been relatively unrevealing.     It is possible that his portal hypertension is causing his pain, but difficult to prove.  In the interim since last visit, he has had increased melena.  Some of this may be contributed to mildly elevated INR, however he was not technically supra-therapeutic.   After long discussion, Mr. Gloria wishes to proceed with attempted portal vein and TIPS recanalization.  We dicussed the potential risks, benefits, and technical aspects of this procedure including general anesthesia, transplenic and venous access, and potential use of advanced recanalization techniques including sharp and laser recanalization.  Additionally, I emphasized that his abdominal pain may not improve, and we can't guarantee long term patency of the new stent(s), however staying on anticoagulation will help.  He is amenable to proceed in hopes that his abdominal pain and bleeding will resolve."   He underwent transsplenic portal venous recanalization with laser atherectomy assisted recanalization of the occluded TIPS on 12/25/20.  At his last visit, he stated resolution of prior abdominal pain.  He remained on Coumadin under the surveillance of Dr. Lindi Adie who he most recently saw on 11/25/21 without any updates.   ***  Past Medical History:  Diagnosis Date   Antiphospholipid antibody syndrome  (HCC)    Cough due to ACE inhibitor    clear mucous due to lisinopril   COVID 01/06-2020   asymptomatic   Headache    Hypertension    off lisinopril since 05-08-2020   Lupus anticoagulant positive    Portal vein thrombosis    Pre-diabetes    Prostate cancer ([place])    Wears glasses    for reading    Past Surgical History:  Procedure Laterality Date   CYSTOSCOPY N/A 05/31/2020   Procedure: CYSTOSCOPY FLEXIBLE;  Surgeon: Robley Fries, MD;  Location: [facility];  Service: Urology;  Laterality: N/A;  NO SEEDS FOUND BY DR. PACE   IR RADIOLOGIST EVAL & MGMT  01/03/2020   IR RADIOLOGIST EVAL & MGMT  04/18/2020   IR RADIOLOGIST EVAL & MGMT  10/23/2020   IR RADIOLOGIST EVAL & MGMT  11/21/2020   IR RADIOLOGIST EVAL & MGMT  01/10/2021   IR RADIOLOGIST EVAL & MGMT  02/07/2021   IR TIPS  11/02/2019   IR TIPS REVISION MOD SED  01/09/2020   IR TIPS REVISION MOD SED  12/25/2020   IR TRANSCATH PLC STENT  INITIAL VEIN  INC ANGIOPLASTY  12/25/2020   IR TRANSHEPATIC PORTOGRAM W HEMO  12/25/2020   IR US GUIDE VASC ACCESS LEFT  12/25/2020   PROSTATE BIOPSY     RADIOACTIVE SEED IMPLANT N/A 05/31/2020   Procedure: RADIOACTIVE SEED IMPLANT/BRACHYTHERAPY IMPLANT;  Surgeon: Robley Fries, MD;  Location: [place];  Service: Urology;  Laterality: N/A;  SEEDS IMPLANTED   RADIOLOGY WITH ANESTHESIA N/A 01/09/2020   Procedure: IR WITH ANESTHESIA TIPS REVISION;  Surgeon: Radiologist, Medication, MD;  Location: [place];  Service: Radiology;  Laterality: N/A;   RADIOLOGY WITH ANESTHESIA N/A 12/25/2020   Procedure: TIPS;  Surgeon: Suzette Battiest, MD;  Location: [place];  Service: Radiology;  Laterality: N/A;   TIPS PROCEDURE      Allergies: Lisinopril  Medications: Prior to Admission medications   Medication Sig Start Date End Date Taking? Authorizing Provider  albuterol (VENTOLIN HFA) 108 (90 Base) MCG/ACT inhaler Inhale 2 puffs into the lungs 2 (two) times daily as needed for  shortness of breath. 12/14/20   [provider]  Blood Glucose Monitoring Suppl (CONTOUR NEXT EZ) w/Device KIT See admin instructions. 03/02/20   [provider]  CONTOUR NEXT TEST test strip daily. 03/01/20   [provider]  fluticasone (FLONASE) 50 MCG/ACT nasal spray Place 1 spray into both nostrils daily as needed for congestion. 09/30/20   [provider]  glipiZIDE (GLUCOTROL XL) 2.5 MG 24 hr tablet Take 2.5 mg by mouth daily. 02/22/20   [provider]  latanoprost (XALATAN) 0.005 % ophthalmic solution Place 1 drop into both eyes daily in the afternoon. 1500 12/10/20   [provider]  Microlet Lancets MISC daily. 03/01/20   [provider]  Multiple Vitamin (MULTIVITAMIN) tablet Take 1 tablet by mouth daily.    [provider]  omeprazole (PRILOSEC) 40 MG capsule Take 1 capsule (40 mg total) by mouth daily. Patient not taking: Reported on 12/19/2020 12/05/20   [name], MD  pravastatin (PRAVACHOL) 20 MG tablet Take 20 mg by mouth daily. 02/22/20   [provider]  sucralfate (CARAFATE) 1 g tablet Take 1 tablet (1 g total) by mouth 4 (four) times daily -  with meals and at bedtime. Patient not taking: Reported on 12/19/2020 12/05/20   [name], MD  tamsulosin (FLOMAX) 0.4 MG CAPS capsule Take 0.4 mg by mouth at bedtime. 07/25/21   [provider]  terbinafine (LAMISIL) 250 MG tablet Take 1 tablet (250 mg total) by mouth daily. 01/03/22   Felipa Furnace, DPM  Vitamin D, Ergocalciferol, (DRISDOL) 1.25 MG (50000 UNIT) CAPS capsule Take 50,000 Units by mouth once a week. 06/10/20   [provider]  warfarin (COUMADIN) 5 MG tablet Take 1-2 tablets (5-10 mg total) by mouth See admin instructions. Takes 5 mg on Wednesday, Thursday Friday and Saturday all the other day take 10 mg 09/16/21   [name], MD  ranitidine (ZANTAC) 150 MG tablet Take 1 tablet (150 mg total) by mouth 2 (two) times  daily. Patient not taking: Reported on 12/11/2019 03/19/18 12/11/19  Muthersbaugh, Jarrett Soho, PA-C     Family History  Problem Relation Age of Onset   Prostate cancer Neg Hx    Colon cancer Neg Hx    Pancreatic cancer Neg Hx    Breast cancer Neg Hx     Social History   Socioeconomic History   Marital status: Married    Spouse name: [name]   Number of children: 3   Years of education: Not on file   Highest education level: Not on file  Occupational History   Not on file  Tobacco Use   Smoking status: Former    Packs/day: 1.00    Years: 20.00    Total pack years: 20.00    Types: Cigarettes    Quit date: 02/24/1994    Years since quitting: 27.9   Smokeless tobacco: Never  Vaping Use   Vaping Use: Never used  Substance and Sexual Activity   Alcohol use: No   Drug use: No  Comment: hx of marijuana and crack 25 yrs ago last used   Sexual activity: Yes  Other Topics Concern   Not on file  Social History Narrative   Not on file   Social Determinants of Health   Financial Resource Strain: High Risk (03/28/2020)   Overall Financial Resource Strain (CARDIA)    Difficulty of Paying Living Expenses: Very hard  Food Insecurity: Food Insecurity Present (03/28/2020)   Hunger Vital Sign    Worried About Running Out of Food in the Last Year: Sometimes true    Ran Out of Food in the Last Year: Not on file  Transportation Needs: Not on file  Physical Activity: Not on file  Stress: Not on file  Social Connections: Not on file     Vital Signs: There were no vitals taken for this visit.  Physical Exam  Imaging: No new pertinent imaging.  Labs:  CBC: Recent Labs    05/17/21 1126 09/16/21 1323 11/25/21 1428 01/24/22 1332  WBC 3.0* 3.4* 3.6* 3.5*  HGB 13.0 13.4 13.2 13.0  HCT 39.3 40.2 39.2 38.6*  PLT 273 221 234 228    COAGS: Recent Labs    05/17/21 1126 09/16/21 1323 11/25/21 1428 01/24/22 1332  INR 2.3* 2.3* 2.1* 2.6*    BMP: No results for input(s): "NA",  "K", "CL", "CO2", "GLUCOSE", "BUN", "CALCIUM", "CREATININE", "GFRNONAA", "GFRAA" in the last 8760 hours.  Invalid input(s): "CMP"  LIVER FUNCTION TESTS: Recent Labs    01/02/22 1645  BILITOT 1.0  AST 29  ALT 31  PROT 7.0    Assessment and Plan: 66 year old male with history of antiphospholipid syndrome who presented at an outside hospital in September 2021 with portal thrombus treat by thrombectomy from a TIPS approach.  The TIPS and portal vein were noted to be thrombosed shortly thereafter, and has since developed cavernous transformation of the portal vein.     He is now status post trans-splenic portal vein recanalization, TIPS recanalization and relining with portal/splenic vein stent placement on 12/25/20.  This procedure was attempted in hopes of possibly improving his pain.   At 6 week follow up with significant improvement in pain.  No encephalopathy or signs of worsening portal hypertension.  We again discussed realistic expectations that the pain may never go away completely, and that his case if very complex.  At this time we will manage solely based on symptoms as obtaining follow up imaging would not necessarily be directive.   Plan to follow up in 6 months.  No imaging required at this time.  Please continue coumadin.  Electronically Signed: Suzette Battiest 01/31/2022, 12:11 PM   I spent a total of 40 Minutes in face to face in clinical consultation, greater than 50% of which was counseling/coordinating care for portal vein thrombosis.

## 2022-01-31 NOTE — Telephone Encounter (Signed)
Patient has been updated.

## 2022-02-12 ENCOUNTER — Telehealth: Payer: Self-pay | Admitting: Podiatry

## 2022-02-12 NOTE — Telephone Encounter (Signed)
Medication needs a prior authorization to cover the 90 day supply.   terbinafine (LAMISIL) 250 MG tablet   [id number] [address]  [place], [place] [address]   Please advise

## 2022-02-13 ENCOUNTER — Telehealth: Payer: Self-pay | Admitting: *Deleted

## 2022-02-13 NOTE — Telephone Encounter (Signed)
Yes, the terbinafine has been approved but for only 84 tablets within a 6 months period per his insurance([employer]),reached out to pharmacy to make them aware, they said that the patient had already picked up 30 tablets.

## 2022-02-13 NOTE — Telephone Encounter (Signed)
Patient's medication has been approved for terbinafine, for 84 tablets per 6 months, updated the pharmacy,patient has picked up a 30 day supply already per pharmacy.

## 2022-02-26 ENCOUNTER — Ambulatory Visit
Admission: RE | Admit: 2022-02-26 | Discharge: 2022-02-26 | Disposition: A | Discharge status: Home / Self Care | Payer: PPO | Source: Ambulatory Visit | Attending: Interventional Radiology | Admitting: Interventional Radiology

## 2022-02-26 DIAGNOSIS — I81 Portal vein thrombosis: Secondary | ICD-10-CM

## 2022-02-26 NOTE — Progress Notes (Signed)
Reason for follow up: 14 months status post trans-splenic portal vein and TIPS recanalization, telephone virtual visit   History of present illness: HPI from 02/07/21 visit:  "67 year old non-cirrhotic male with history of portal thrombus and cavernous transformation status post TIPS creation and portal thrombectomy at outside facility in September 2021.  Failed attempt at TIPS recanalization in November 2021 by me.  He compensated well from a portal hypertension standpoint initially, but now has experienced increasing and persistent abdominal pain which is vague and difficult to determine etiology.  Endoscopic evaluation has been relatively unrevealing.     It is possible that his portal hypertension is causing his pain, but difficult to prove.  In the interim since last visit, he has had increased melena.  Some of this may be contributed to mildly elevated INR, however he was not technically supra-therapeutic.   After long discussion, [name] wishes to proceed with attempted portal vein and TIPS recanalization.  We dicussed the potential risks, benefits, and technical aspects of this procedure including general anesthesia, transplenic and venous access, and potential use of advanced recanalization techniques including sharp and laser recanalization.  Additionally, I emphasized that his abdominal pain may not improve, and we can't guarantee long term patency of the new stent(s), however staying on anticoagulation will help.  He is amenable to proceed in hopes that his abdominal pain and bleeding will resolve."   He underwent transsplenic portal venous recanalization with laser atherectomy assisted recanalization of the occluded TIPS on 12/25/20.  At his last visit, he complained of vague mid-abdominal pain.  He presents today for follow up.   His pain has significantly improved.  He has gone back to work.  Occasionally with lifting he will have a mild, transient pain in the upper abdomen.  No  abdominal or extremity swelling.  No encephalopathy.  He remains compliant with coumadin.   He was lost to follow up at last scheduled 6 month follow up.  He had an episode of chest pain about 1 month ago and scheduled this visit.  That pain resolved spontaneously.    He continues to follow with Dr. Lindi Adie and remains on coumadin.  He had an episode of GI bleeding in March which resolved.  At his last visit with Dr. Lindi Adie on 11/25/21 he was doing well without any reported pain.  Continues to have no pain in the abdomen.  Is able to tolerate diet of whatever he wants.    Now retired (since July) from [employer].  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    Past Medical History:  Diagnosis Date   Antiphospholipid antibody syndrome (HCC)    Cough due to ACE inhibitor    clear mucous due to lisinopril   COVID 01/06-2020   asymptomatic   Headache    Hypertension    off lisinopril since 05-08-2020   Lupus anticoagulant positive    Portal vein thrombosis    Pre-diabetes    Prostate cancer ([place])    Wears glasses    for reading    Past Surgical History:  Procedure Laterality Date   CYSTOSCOPY N/A 05/31/2020   Procedure: CYSTOSCOPY FLEXIBLE;  Surgeon: Robley Fries, MD;  Location: [facility];  Service: Urology;  Laterality: N/A;  NO SEEDS FOUND BY DR. PACE   IR RADIOLOGIST EVAL & MGMT  01/03/2020   IR RADIOLOGIST EVAL & MGMT  04/18/2020   IR RADIOLOGIST EVAL & MGMT  10/23/2020   IR RADIOLOGIST EVAL & MGMT  11/21/2020   IR RADIOLOGIST EVAL & MGMT  01/10/2021   IR RADIOLOGIST EVAL & MGMT  02/07/2021   IR TIPS  11/02/2019   IR TIPS REVISION MOD SED  01/09/2020   IR TIPS REVISION MOD SED  12/25/2020   IR TRANSCATH PLC STENT  INITIAL VEIN  INC ANGIOPLASTY  12/25/2020   IR TRANSHEPATIC PORTOGRAM W HEMO  12/25/2020   IR US GUIDE VASC ACCESS LEFT  12/25/2020   PROSTATE BIOPSY     RADIOACTIVE SEED IMPLANT N/A 05/31/2020   Procedure: RADIOACTIVE SEED IMPLANT/BRACHYTHERAPY IMPLANT;  Surgeon:  Robley Fries, MD;  Location: [place];  Service: Urology;  Laterality: N/A;  SEEDS IMPLANTED   RADIOLOGY WITH ANESTHESIA N/A 01/09/2020   Procedure: IR WITH ANESTHESIA TIPS REVISION;  Surgeon: Radiologist, Medication, MD;  Location: [place];  Service: Radiology;  Laterality: N/A;   RADIOLOGY WITH ANESTHESIA N/A 12/25/2020   Procedure: TIPS;  Surgeon: Suzette Battiest, MD;  Location: [place];  Service: Radiology;  Laterality: N/A;   TIPS PROCEDURE      Allergies: Lisinopril  Medications: Prior to Admission medications   Medication Sig Start Date End Date Taking? Authorizing Provider  albuterol (VENTOLIN HFA) 108 (90 Base) MCG/ACT inhaler Inhale 2 puffs into the lungs 2 (two) times daily as needed for shortness of breath. 12/14/20   [provider]  Blood Glucose Monitoring Suppl (CONTOUR NEXT EZ) w/Device KIT See admin instructions. 03/02/20   [provider]  CONTOUR NEXT TEST test strip daily. 03/01/20   [provider]  fluticasone (FLONASE) 50 MCG/ACT nasal spray Place 1 spray into both nostrils daily as needed for congestion. 09/30/20   [provider]  glipiZIDE (GLUCOTROL XL) 2.5 MG 24 hr tablet Take 2.5 mg by mouth daily. 02/22/20   [provider]  latanoprost (XALATAN) 0.005 % ophthalmic solution Place 1 drop into both eyes daily in the afternoon. 1500 12/10/20   [provider]  Microlet Lancets MISC daily. 03/01/20   [provider]  Multiple Vitamin (MULTIVITAMIN) tablet Take 1 tablet by mouth daily.    [provider]  omeprazole (PRILOSEC) 40 MG capsule Take 1 capsule (40 mg total) by mouth daily. Patient not taking: Reported on 12/19/2020 12/05/20   Harry Pence, MD  pravastatin (PRAVACHOL) 20 MG tablet Take 20 mg by mouth daily. 02/22/20   [provider]  sucralfate (CARAFATE) 1 g tablet Take 1 tablet (1 g total) by mouth 4 (four) times daily -  with meals and at bedtime. Patient  not taking: Reported on 12/19/2020 12/05/20   Harry Pence, MD  tamsulosin (FLOMAX) 0.4 MG CAPS capsule Take 0.4 mg by mouth at bedtime. 07/25/21   [provider]  terbinafine (LAMISIL) 250 MG tablet Take 1 tablet (250 mg total) by mouth daily. 01/03/22   Felipa Furnace, DPM  Vitamin D, Ergocalciferol, (DRISDOL) 1.25 MG (50000 UNIT) CAPS capsule Take 50,000 Units by mouth once a week. 06/10/20   [provider]  warfarin (COUMADIN) 5 MG tablet Take 1-2 tablets (5-10 mg total) by mouth See admin instructions. Takes 5 mg on Wednesday, Thursday Friday and Saturday all the other day take 10 mg 09/16/21   [name], MD  ranitidine (ZANTAC) 150 MG tablet Take 1 tablet (150 mg total) by mouth 2 (two) times daily. Patient not taking: Reported on 12/11/2019 03/19/18 12/11/19  Muthersbaugh, Jarrett Soho, PA-C     Family History  Problem Relation Age of Onset   Prostate cancer Neg Hx  Colon cancer Neg Hx    Pancreatic cancer Neg Hx    Breast cancer Neg Hx     Social History   Socioeconomic History   Marital status: Married    Spouse name: [name]   Number of children: 3   Years of education: Not on file   Highest education level: Not on file  Occupational History   Not on file  Tobacco Use   Smoking status: Former    Packs/day: 1.00    Years: 20.00    Total pack years: 20.00    Types: Cigarettes    Quit date: 02/24/1994    Years since quitting: 28.0   Smokeless tobacco: Never  Vaping Use   Vaping Use: Never used  Substance and Sexual Activity   Alcohol use: No   Drug use: No    Comment: hx of marijuana and crack 25 yrs ago last used   Sexual activity: Yes  Other Topics Concern   Not on file  Social History Narrative   Not on file   Social Determinants of Health   Financial Resource Strain: High Risk (03/28/2020)   Overall Financial Resource Strain (CARDIA)    Difficulty of Paying Living Expenses: Very hard  Food Insecurity: Food Insecurity Present (03/28/2020)    Hunger Vital Sign    Worried About Running Out of Food in the Last Year: Sometimes true    Ran Out of Food in the Last Year: Not on file  Transportation Needs: Not on file  Physical Activity: Not on file  Stress: Not on file  Social Connections: Not on file     Vital Signs: There were no vitals taken for this visit.  No physical examination was performed in lieu of virtual telephone clinic visit.   Imaging: No new pertinent imaging.  Labs:  CBC: Recent Labs    05/17/21 1126 09/16/21 1323 11/25/21 1428 01/24/22 1332  WBC 3.0* 3.4* 3.6* 3.5*  HGB 13.0 13.4 13.2 13.0  HCT 39.3 40.2 39.2 38.6*  PLT 273 221 234 228    COAGS: Recent Labs    05/17/21 1126 09/16/21 1323 11/25/21 1428 01/24/22 1332  INR 2.3* 2.3* 2.1* 2.6*    BMP: No results for input(s): "NA", "K", "CL", "CO2", "GLUCOSE", "BUN", "CALCIUM", "CREATININE", "GFRNONAA", "GFRAA" in the last 8760 hours.  Invalid input(s): "CMP"  LIVER FUNCTION TESTS: Recent Labs    01/02/22 1645  BILITOT 1.0  AST 29  ALT 31  PROT 7.0    Assessment and Plan: 67 year old male with history of antiphospholipid syndrome who presented at an outside hospital in September 2021 with portal thrombus treat by thrombectomy from a TIPS approach.  The TIPS and portal vein were noted to be thrombosed shortly thereafter, and has since developed cavernous transformation of the portal vein.     He is now status post trans-splenic portal vein recanalization, TIPS recanalization and relining with portal/splenic vein stent placement on 12/25/20.  This procedure was attempted in hopes of possibly improving his pain.   Thankfully, he has done remarkably well.  No encephalopathy or signs of worsening portal hypertension.    We again discussed realistic expectations and that his case if very complex.  At this time we will manage solely based on symptoms as obtaining follow up imaging would not necessarily be directive.  At this point,  complete resolution of his prior pain for >1 year is a triumph.   -Agree with indefinite continuation of coumadin. -Follow up in IR as needed.  Ruthann Cancer, MD Pager: [phone] Clinic: [phone]     I spent a total of 25 Minutes in virtual telephone clinical consultation, greater than 50% of which was counseling/coordinating care for portal vein occlusion.

## 2022-03-27 ENCOUNTER — Inpatient Hospital Stay: Payer: PPO | Attending: Hematology and Oncology

## 2022-03-27 ENCOUNTER — Other Ambulatory Visit: Payer: Self-pay

## 2022-03-27 DIAGNOSIS — C61 Malignant neoplasm of prostate: Secondary | ICD-10-CM | POA: Insufficient documentation

## 2022-03-27 DIAGNOSIS — D6861 Antiphospholipid syndrome: Secondary | ICD-10-CM

## 2022-03-27 DIAGNOSIS — I81 Portal vein thrombosis: Secondary | ICD-10-CM | POA: Insufficient documentation

## 2022-03-27 LAB — CBC WITH DIFFERENTIAL (CANCER CENTER ONLY)
Abs Immature Granulocytes: 0 10*3/uL (ref 0.00–0.07)
Basophils Absolute: 0 10*3/uL (ref 0.0–0.1)
Basophils Relative: 1 %
Eosinophils Absolute: 0.1 10*3/uL (ref 0.0–0.5)
Eosinophils Relative: 2 %
HCT: 37 % — ABNORMAL LOW (ref 39.0–52.0)
Hemoglobin: 12.6 g/dL — ABNORMAL LOW (ref 13.0–17.0)
Immature Granulocytes: 0 %
Lymphocytes Relative: 37 %
Lymphs Abs: 1.3 10*3/uL (ref 0.7–4.0)
MCH: 27.6 pg (ref 26.0–34.0)
MCHC: 34.1 g/dL (ref 30.0–36.0)
MCV: 81.1 fL (ref 80.0–100.0)
Monocytes Absolute: 0.5 10*3/uL (ref 0.1–1.0)
Monocytes Relative: 15 %
Neutro Abs: 1.5 10*3/uL — ABNORMAL LOW (ref 1.7–7.7)
Neutrophils Relative %: 45 %
Platelet Count: 224 10*3/uL (ref 150–400)
RBC: 4.56 MIL/uL (ref 4.22–5.81)
RDW: 14.9 % (ref 11.5–15.5)
WBC Count: 3.4 10*3/uL — ABNORMAL LOW (ref 4.0–10.5)
nRBC: 0 % (ref 0.0–0.2)

## 2022-03-27 LAB — PROTIME-INR
INR: 2.7 — ABNORMAL HIGH (ref 0.8–1.2)
Prothrombin Time: 28.3 seconds — ABNORMAL HIGH (ref 11.4–15.2)

## 2022-04-16 ENCOUNTER — Other Ambulatory Visit (HOSPITAL_COMMUNITY): Payer: Self-pay | Admitting: Urology

## 2022-04-16 DIAGNOSIS — C61 Malignant neoplasm of prostate: Secondary | ICD-10-CM

## 2022-04-29 ENCOUNTER — Ambulatory Visit (HOSPITAL_COMMUNITY)
Admission: RE | Admit: 2022-04-29 | Discharge: 2022-04-29 | Disposition: A | Discharge status: Home / Self Care | Payer: PPO | Source: Ambulatory Visit | Attending: Urology | Admitting: Urology

## 2022-04-29 DIAGNOSIS — C61 Malignant neoplasm of prostate: Secondary | ICD-10-CM | POA: Insufficient documentation

## 2022-04-29 MED ORDER — PIFLIFOLASTAT F 18 (PYLARIFY) INJECTION
9.0000 | Freq: Once | INTRAVENOUS | Status: AC
  Administered 2022-04-29: 8.3 via INTRAVENOUS

## 2022-05-06 ENCOUNTER — Ambulatory Visit: Payer: PPO | Admitting: Podiatry

## 2022-05-07 ENCOUNTER — Ambulatory Visit: Payer: PPO | Admitting: Podiatry

## 2022-05-13 ENCOUNTER — Encounter: Payer: Self-pay | Admitting: *Deleted

## 2022-05-13 NOTE — Progress Notes (Signed)
Received request for surgical clearance from Dr. Claudia Desanctis with [facility] for pt to stop Warfarin 5 days prior to prostate biopsy on 05/28/22.  Verbal orders received from MD with okay for pt to stop Warfain 5 days prior and resume the day after procedure.  Pt also scheduled to recheck INR 06/06/22.  Pt educated and verbalized understanding.  Clearance letter successfully faxed to [phone].

## 2022-05-14 ENCOUNTER — Ambulatory Visit: Payer: PPO | Admitting: Podiatry

## 2022-05-14 DIAGNOSIS — B351 Tinea unguium: Secondary | ICD-10-CM

## 2022-05-14 DIAGNOSIS — Z79899 Other long term (current) drug therapy: Secondary | ICD-10-CM | POA: Diagnosis not present

## 2022-05-14 NOTE — Progress Notes (Signed)
Subjective:  Patient ID: Harry Lucero, male    DOB: [birth year],  MRN: [id number]  Chief Complaint  Patient presents with   Nail Problem    Fungus     67 y.o. male presents with the above complaint.  Patient presents with right second digit thickened elongated dystrophic toenails x1.  Patient presents that there is improvement to the second toe.  He would like to discuss next treatment plan he would like to continue medication if needed.   Review of Systems: Negative except as noted in the HPI. Denies N/V/F/Ch.  Past Medical History:  Diagnosis Date   Antiphospholipid antibody syndrome (HCC)    Cough due to ACE inhibitor    clear mucous due to lisinopril   COVID 01/06-2020   asymptomatic   Headache    Hypertension    off lisinopril since 05-08-2020   Lupus anticoagulant positive    Portal vein thrombosis    Pre-diabetes    Prostate cancer ([place])    Wears glasses    for reading    Current Outpatient Medications:    albuterol (VENTOLIN HFA) 108 (90 Base) MCG/ACT inhaler, Inhale 2 puffs into the lungs 2 (two) times daily as needed for shortness of breath., Disp: , Rfl:    Blood Glucose Monitoring Suppl (CONTOUR NEXT EZ) w/Device KIT, See admin instructions., Disp: , Rfl:    CONTOUR NEXT TEST test strip, daily., Disp: , Rfl:    fluticasone (FLONASE) 50 MCG/ACT nasal spray, Place 1 spray into both nostrils daily as needed for congestion., Disp: , Rfl:    glipiZIDE (GLUCOTROL XL) 2.5 MG 24 hr tablet, Take 2.5 mg by mouth daily., Disp: , Rfl:    latanoprost (XALATAN) 0.005 % ophthalmic solution, Place 1 drop into both eyes daily in the afternoon. 1500, Disp: , Rfl:    Microlet Lancets MISC, daily., Disp: , Rfl:    Multiple Vitamin (MULTIVITAMIN) tablet, Take 1 tablet by mouth daily., Disp: , Rfl:    omeprazole (PRILOSEC) 40 MG capsule, Take 1 capsule (40 mg total) by mouth daily. (Patient not taking: Reported on 12/19/2020), Disp: 30 capsule, Rfl: 0   pravastatin (PRAVACHOL) 20 MG  tablet, Take 20 mg by mouth daily., Disp: , Rfl:    sucralfate (CARAFATE) 1 g tablet, Take 1 tablet (1 g total) by mouth 4 (four) times daily -  with meals and at bedtime. (Patient not taking: Reported on 12/19/2020), Disp: 90 tablet, Rfl: 0   tamsulosin (FLOMAX) 0.4 MG CAPS capsule, Take 0.4 mg by mouth at bedtime., Disp: , Rfl:    terbinafine (LAMISIL) 250 MG tablet, Take 1 tablet (250 mg total) by mouth daily., Disp: 90 tablet, Rfl: 0   Vitamin D, Ergocalciferol, (DRISDOL) 1.25 MG (50000 UNIT) CAPS capsule, Take 50,000 Units by mouth once a week., Disp: , Rfl:    warfarin (COUMADIN) 5 MG tablet, Take 1-2 tablets (5-10 mg total) by mouth See admin instructions. Takes 5 mg on Wednesday, Thursday Friday and Saturday all the other day take 10 mg, Disp: 120 tablet, Rfl: 3  Social History   Tobacco Use  Smoking Status Former   Packs/day: 1.00   Years: 20.00   Additional pack years: 0.00   Total pack years: 20.00   Types: Cigarettes   Quit date: 02/24/1994   Years since quitting: 28.2  Smokeless Tobacco Never    Allergies  Allergen Reactions   Lisinopril     cough   Objective:  There were no vitals filed for this visit. There is  no height or weight on file to calculate BMI. Constitutional Well developed. Well nourished.  Vascular Dorsalis pedis pulses palpable bilaterally. Posterior tibial pulses palpable bilaterally. Capillary refill normal to all digits.  No cyanosis or clubbing noted. Pedal hair growth normal.  Neurologic Normal speech. Oriented to person, place, and time. Epicritic sensation to light touch grossly present bilaterally.  Dermatologic Right second digit thickened elongated dystrophic toenails x1.  Mild pain on palpation.  Improving  Orthopedic: Normal joint ROM without pain or crepitus bilaterally. No visible deformities. No bony tenderness.   Radiographs: None Assessment:   1. Onychomycosis due to dermatophyte   2. Nail fungus   3. Long-term use of  high-risk medication    Plan:  Patient was evaluated and treated and all questions answered.  Right second digit onychomycosis~second round -Educated the patient on the etiology of onychomycosis and various treatment options associated with improving the fungal load.  I explained to the patient that there is 3 treatment options available to treat the onychomycosis including topical, p.o., laser treatment.  Patient elected to undergo p.o. options with Lamisil/terbinafine therapy.  In order for me to start the second round of medication therapy, I explained to the patient the importance of evaluating the liver and obtaining the l second round iver function test.  Once the liver function test comes back normal I will start him on second round 60-month course of Lamisil therapy.  Patient understood all risk and would like to proceed with Lamisil therapy.  I have asked the patient to immediately stop the Lamisil therapy if she has any reactions to it and call the office or go to the emergency room right away.  Patient states understanding   No follow-ups on file.

## 2022-05-15 LAB — HEPATIC FUNCTION PANEL
AG Ratio: 1.5 (calc) (ref 1.0–2.5)
ALT: 30 U/L (ref 9–46)
AST: 26 U/L (ref 10–35)
Albumin: 4.3 g/dL (ref 3.6–5.1)
Alkaline phosphatase (APISO): 48 U/L (ref 35–144)
Bilirubin, Direct: 0.2 mg/dL (ref 0.0–0.2)
Globulin: 2.9 g/dL (calc) (ref 1.9–3.7)
Indirect Bilirubin: 0.4 mg/dL (calc) (ref 0.2–1.2)
Total Bilirubin: 0.6 mg/dL (ref 0.2–1.2)
Total Protein: 7.2 g/dL (ref 6.1–8.1)

## 2022-05-15 MED ORDER — TERBINAFINE HCL 250 MG PO TABS: 250.0000 mg | ORAL_TABLET | Freq: Every day | ORAL | 0 refills | Status: DC

## 2022-05-15 NOTE — Addendum Note (Signed)
Addended by: Boneta Lucks on: 05/15/2022 12:16 PM   Modules accepted: Orders

## 2022-05-26 ENCOUNTER — Inpatient Hospital Stay: Payer: PPO

## 2022-06-06 ENCOUNTER — Other Ambulatory Visit: Payer: Self-pay

## 2022-06-06 ENCOUNTER — Encounter: Payer: Self-pay | Admitting: *Deleted

## 2022-06-06 ENCOUNTER — Inpatient Hospital Stay: Payer: PPO | Attending: Hematology and Oncology

## 2022-06-06 DIAGNOSIS — I81 Portal vein thrombosis: Secondary | ICD-10-CM | POA: Insufficient documentation

## 2022-06-06 DIAGNOSIS — D6861 Antiphospholipid syndrome: Secondary | ICD-10-CM | POA: Diagnosis present

## 2022-06-06 DIAGNOSIS — C61 Malignant neoplasm of prostate: Secondary | ICD-10-CM | POA: Diagnosis present

## 2022-06-06 LAB — CBC WITH DIFFERENTIAL (CANCER CENTER ONLY)
Abs Immature Granulocytes: 0 10*3/uL (ref 0.00–0.07)
Basophils Absolute: 0 10*3/uL (ref 0.0–0.1)
Basophils Relative: 1 %
Eosinophils Absolute: 0.1 10*3/uL (ref 0.0–0.5)
Eosinophils Relative: 2 %
HCT: 39.7 % (ref 39.0–52.0)
Hemoglobin: 13.3 g/dL (ref 13.0–17.0)
Immature Granulocytes: 0 %
Lymphocytes Relative: 42 %
Lymphs Abs: 1.4 10*3/uL (ref 0.7–4.0)
MCH: 27.5 pg (ref 26.0–34.0)
MCHC: 33.5 g/dL (ref 30.0–36.0)
MCV: 82 fL (ref 80.0–100.0)
Monocytes Absolute: 0.5 10*3/uL (ref 0.1–1.0)
Monocytes Relative: 15 %
Neutro Abs: 1.3 10*3/uL — ABNORMAL LOW (ref 1.7–7.7)
Neutrophils Relative %: 40 %
Platelet Count: 222 10*3/uL (ref 150–400)
RBC: 4.84 MIL/uL (ref 4.22–5.81)
RDW: 14.9 % (ref 11.5–15.5)
WBC Count: 3.3 10*3/uL — ABNORMAL LOW (ref 4.0–10.5)
nRBC: 0 % (ref 0.0–0.2)

## 2022-06-06 LAB — PROTIME-INR
INR: 1.7 — ABNORMAL HIGH (ref 0.8–1.2)
Prothrombin Time: 20 seconds — ABNORMAL HIGH (ref 11.4–15.2)

## 2022-06-06 NOTE — Progress Notes (Signed)
Pt underwent prostate biopsy 05/28/22 and held warfarin 5 days prior and resumed the day after (05/29/22).  Pt INR today 1.7.  Pt states he currently takes Warfarin 5 mg p.o daily on Wednesday, Thursday, Friday, and Saturday.  Then he will take Warfarin 10 mg p.o Sunday, Monday, and Tuesday.  Per MD pt INR is slow to respond after holidng for several days.  MD states pt needs to continue with same medication regimen and recheck INR in 3 weeks.  Pt educated, lab appt scheduled, and pt verbalized understanding.

## 2022-06-13 ENCOUNTER — Ambulatory Visit: Payer: PPO | Admitting: Podiatry

## 2022-06-26 ENCOUNTER — Inpatient Hospital Stay: Payer: PPO | Attending: Hematology and Oncology

## 2022-06-26 ENCOUNTER — Other Ambulatory Visit: Payer: Self-pay

## 2022-06-26 ENCOUNTER — Telehealth: Payer: Self-pay | Admitting: *Deleted

## 2022-06-26 DIAGNOSIS — I81 Portal vein thrombosis: Secondary | ICD-10-CM | POA: Diagnosis present

## 2022-06-26 DIAGNOSIS — D6861 Antiphospholipid syndrome: Secondary | ICD-10-CM | POA: Diagnosis present

## 2022-06-26 DIAGNOSIS — C61 Malignant neoplasm of prostate: Secondary | ICD-10-CM | POA: Insufficient documentation

## 2022-06-26 LAB — CBC WITH DIFFERENTIAL (CANCER CENTER ONLY)
Abs Immature Granulocytes: 0 10*3/uL (ref 0.00–0.07)
Basophils Absolute: 0 10*3/uL (ref 0.0–0.1)
Basophils Relative: 1 %
Eosinophils Absolute: 0.1 10*3/uL (ref 0.0–0.5)
Eosinophils Relative: 2 %
HCT: 38.4 % — ABNORMAL LOW (ref 39.0–52.0)
Hemoglobin: 13.1 g/dL (ref 13.0–17.0)
Immature Granulocytes: 0 %
Lymphocytes Relative: 40 %
Lymphs Abs: 1.6 10*3/uL (ref 0.7–4.0)
MCH: 27.8 pg (ref 26.0–34.0)
MCHC: 34.1 g/dL (ref 30.0–36.0)
MCV: 81.5 fL (ref 80.0–100.0)
Monocytes Absolute: 0.6 10*3/uL (ref 0.1–1.0)
Monocytes Relative: 16 %
Neutro Abs: 1.6 10*3/uL — ABNORMAL LOW (ref 1.7–7.7)
Neutrophils Relative %: 41 %
Platelet Count: 216 10*3/uL (ref 150–400)
RBC: 4.71 MIL/uL (ref 4.22–5.81)
RDW: 14.8 % (ref 11.5–15.5)
WBC Count: 4 10*3/uL (ref 4.0–10.5)
nRBC: 0 % (ref 0.0–0.2)

## 2022-06-26 LAB — PROTIME-INR
INR: 3 — ABNORMAL HIGH (ref 0.8–1.2)
Prothrombin Time: 31.1 seconds — ABNORMAL HIGH (ref 11.4–15.2)

## 2022-06-26 NOTE — Telephone Encounter (Signed)
Per MD request RN placed call to pt regarding INR from today being 3.0.  Per MD pt to continue with same dose (Warfarin 5 mg p.o daily on Wednesday, Thursday, Friday, and Saturday. Then he will take Warfarin 10 mg p.o Sunday, Monday, and Tuesday) and recheck in 1 month.  Pt educated and verbalized understanding.

## 2022-07-28 ENCOUNTER — Telehealth: Payer: Self-pay | Admitting: *Deleted

## 2022-07-28 ENCOUNTER — Other Ambulatory Visit: Payer: Self-pay

## 2022-07-28 ENCOUNTER — Inpatient Hospital Stay: Payer: PPO | Attending: Hematology and Oncology

## 2022-07-28 DIAGNOSIS — C61 Malignant neoplasm of prostate: Secondary | ICD-10-CM | POA: Diagnosis not present

## 2022-07-28 DIAGNOSIS — Z86718 Personal history of other venous thrombosis and embolism: Secondary | ICD-10-CM | POA: Diagnosis present

## 2022-07-28 DIAGNOSIS — D6861 Antiphospholipid syndrome: Secondary | ICD-10-CM | POA: Insufficient documentation

## 2022-07-28 LAB — PROTIME-INR
INR: 2.5 — ABNORMAL HIGH (ref 0.8–1.2)
Prothrombin Time: 27.2 seconds — ABNORMAL HIGH (ref 11.4–15.2)

## 2022-07-28 LAB — CBC WITH DIFFERENTIAL (CANCER CENTER ONLY)
Abs Immature Granulocytes: 0 10*3/uL (ref 0.00–0.07)
Basophils Absolute: 0 10*3/uL (ref 0.0–0.1)
Basophils Relative: 1 %
Eosinophils Absolute: 0.1 10*3/uL (ref 0.0–0.5)
Eosinophils Relative: 1 %
HCT: 39.1 % (ref 39.0–52.0)
Hemoglobin: 13.2 g/dL (ref 13.0–17.0)
Immature Granulocytes: 0 %
Lymphocytes Relative: 40 %
Lymphs Abs: 1.5 10*3/uL (ref 0.7–4.0)
MCH: 27.4 pg (ref 26.0–34.0)
MCHC: 33.8 g/dL (ref 30.0–36.0)
MCV: 81.3 fL (ref 80.0–100.0)
Monocytes Absolute: 0.5 10*3/uL (ref 0.1–1.0)
Monocytes Relative: 14 %
Neutro Abs: 1.6 10*3/uL — ABNORMAL LOW (ref 1.7–7.7)
Neutrophils Relative %: 44 %
Platelet Count: 227 10*3/uL (ref 150–400)
RBC: 4.81 MIL/uL (ref 4.22–5.81)
RDW: 14.9 % (ref 11.5–15.5)
WBC Count: 3.7 10*3/uL — ABNORMAL LOW (ref 4.0–10.5)
nRBC: 0 % (ref 0.0–0.2)

## 2022-07-28 NOTE — Telephone Encounter (Signed)
RN placed call to pt regarding INR 2.5.  Per MD pt to continue with same dose (Warfarin 5 mg p.o daily on Wednesday, Thursday, Friday, and Saturday. Then he will take Warfarin 10 mg p.o Sunday, Monday, and Tuesday) and recheck in 2 month.  Pt educated and verbalized understanding.

## 2022-08-18 NOTE — Progress Notes (Signed)
Nutrition  Wife and patient stopped by Dietitian's office inquiring about services provided.    Chart reviewed and noted diagnosis of prostate cancer in 2021 followed by Dr Pamelia Hoit.  Currently being followed for portal vein thrombosis.   Provided handout on Nutrition for Cancer Survivors.  Information given regarding how to join mailing list to learn about programming each month (includes nutrition classes).    Adi Doro B. Freida Busman, RD, LDN Registered Dietitian [phone]

## 2022-09-02 ENCOUNTER — Other Ambulatory Visit: Payer: Self-pay | Admitting: Hematology and Oncology

## 2022-09-10 ENCOUNTER — Ambulatory Visit: Payer: PPO | Admitting: Podiatry

## 2022-09-29 ENCOUNTER — Other Ambulatory Visit: Payer: Self-pay

## 2022-09-29 ENCOUNTER — Telehealth: Payer: Self-pay

## 2022-09-29 ENCOUNTER — Inpatient Hospital Stay: Payer: PPO | Attending: Hematology and Oncology

## 2022-09-29 DIAGNOSIS — Z86718 Personal history of other venous thrombosis and embolism: Secondary | ICD-10-CM | POA: Insufficient documentation

## 2022-09-29 DIAGNOSIS — Z7901 Long term (current) use of anticoagulants: Secondary | ICD-10-CM | POA: Insufficient documentation

## 2022-09-29 DIAGNOSIS — I8290 Acute embolism and thrombosis of unspecified vein: Secondary | ICD-10-CM

## 2022-09-29 DIAGNOSIS — D6861 Antiphospholipid syndrome: Secondary | ICD-10-CM

## 2022-09-29 LAB — PROTIME-INR
INR: 3 — ABNORMAL HIGH (ref 0.8–1.2)
Prothrombin Time: 31.6 seconds — ABNORMAL HIGH (ref 11.4–15.2)

## 2022-09-29 NOTE — Telephone Encounter (Signed)
Pt called to confirm lab appt today. Confirmed w/pt's wife.

## 2022-10-01 ENCOUNTER — Telehealth: Payer: Self-pay

## 2022-10-01 NOTE — Telephone Encounter (Signed)
Mr Cutrer called and LVM asking for [place] to advise coumadin dosiing. He states he is still taking coumadin 5 mg 4 days a week and 10 mg 3 days a week and will need a refill. Advised pt I would review with MD and we would be in touch with recommendation and refill. He verbalized thanks and understanding.

## 2022-10-02 ENCOUNTER — Other Ambulatory Visit: Payer: Self-pay

## 2022-10-02 NOTE — Progress Notes (Signed)
Per MD, based on 09/29/22 lab work Pt is to take 5mg  of coumadin rx on Wednesday, Thursday, Friday, Saturday, and Sunday. Pt will take 10mg  on Monday and Tuesday. Original instruction was to take 5mg  on Wednesday through Saturday with 10mg  on Sunday through Tuesday. Given above instruction to Pt who verbalized understanding.

## 2022-10-06 ENCOUNTER — Other Ambulatory Visit: Payer: Self-pay

## 2022-10-06 MED ORDER — WARFARIN SODIUM 5 MG PO TABS: 5.0000 mg | ORAL_TABLET | Freq: Every day | ORAL | 3 refills | Status: DC

## 2022-10-15 ENCOUNTER — Telehealth: Payer: Self-pay | Admitting: Podiatry

## 2022-10-15 NOTE — Telephone Encounter (Signed)
Pt called and his insurance is not covering the medication you sent in and pt is asking if you feel he should continue the medication. He would have to pay out of pocket.

## 2022-10-16 IMAGING — US US HEPATIC LIVER DOPPLER
1 series · 13 of 25 positions shown · non-contrast
Comparison: 04/13/2020

CLINICAL DATA: 65-year-old male with known chronic portal thrombus,
chronically occluded indwelling tips with cavernous transformation.
The patient presents with abdominal pain similar to prior portal
vein occlusion.

EXAM:
DUPLEX ULTRASOUND OF LIVER
TECHNIQUE: Color and duplex Doppler ultrasound was performed to evaluate the
hepatic in-flow and out-flow vessels.

[Series 1: us liver doppler · 13 of 62 slices shown]
[im 1/62]
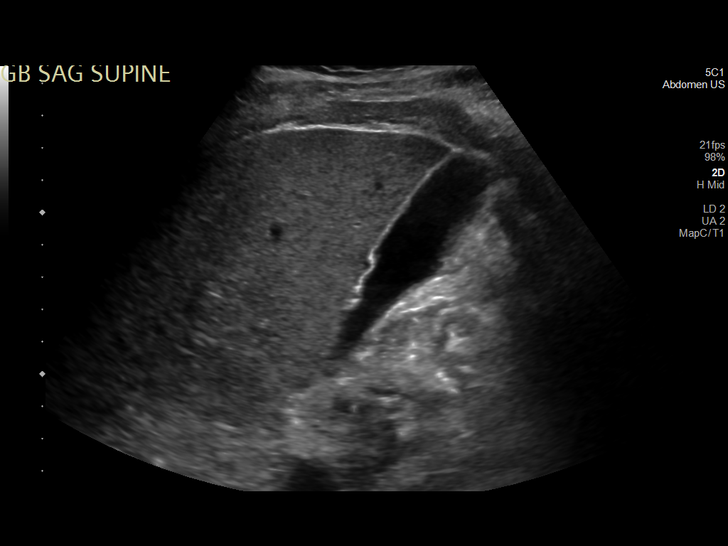
[im 6/62]
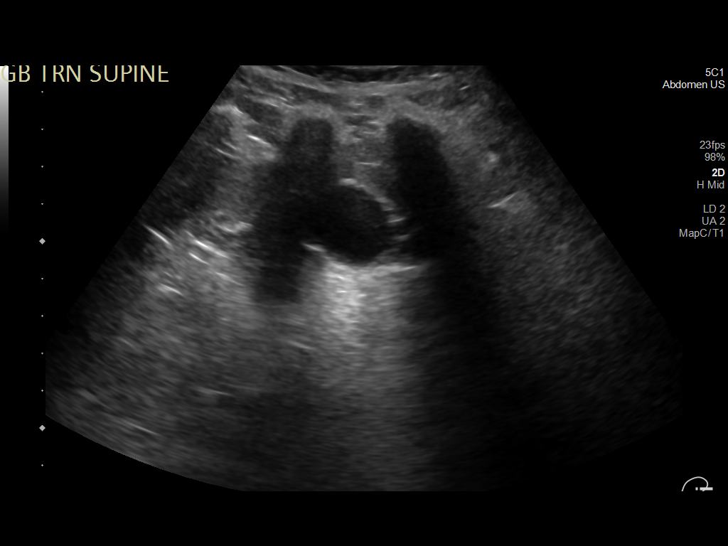
[im 11/62]
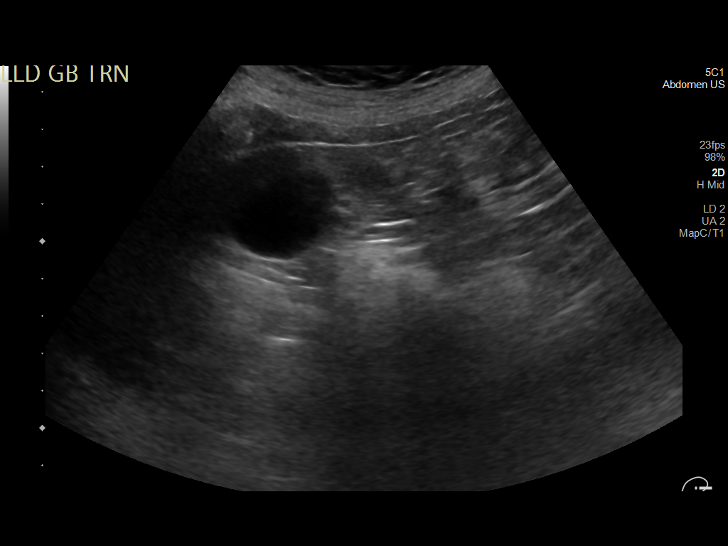
[im 16/62]
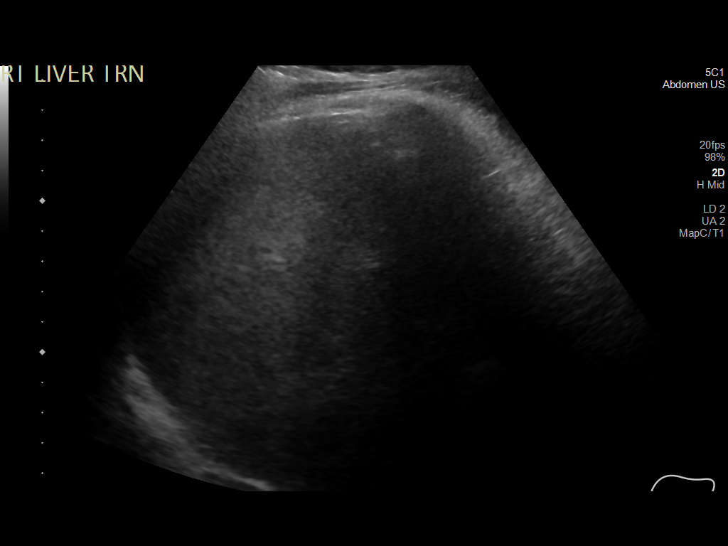
[im 21/62]
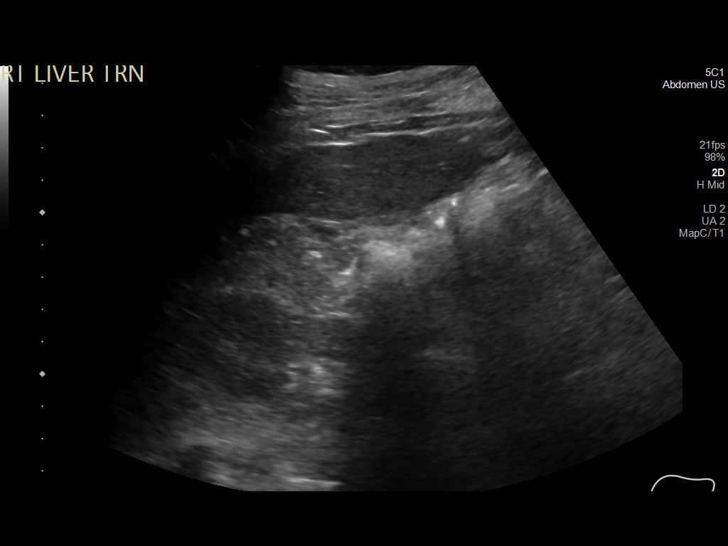
[im 26/62]
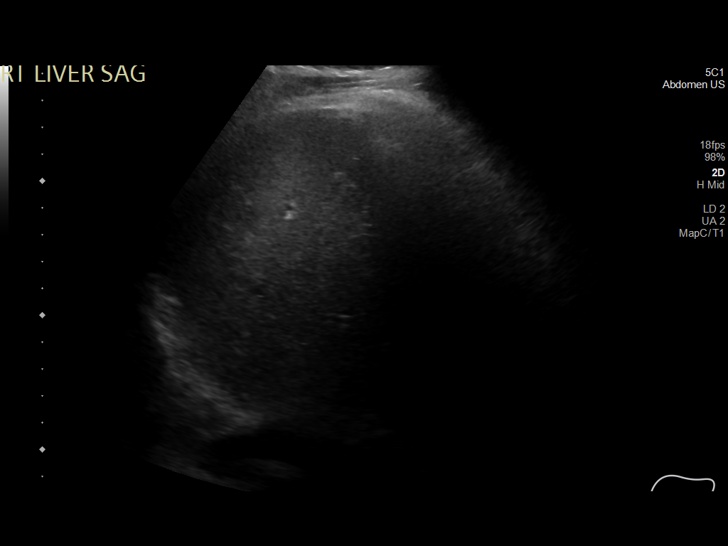
[im 31/62]
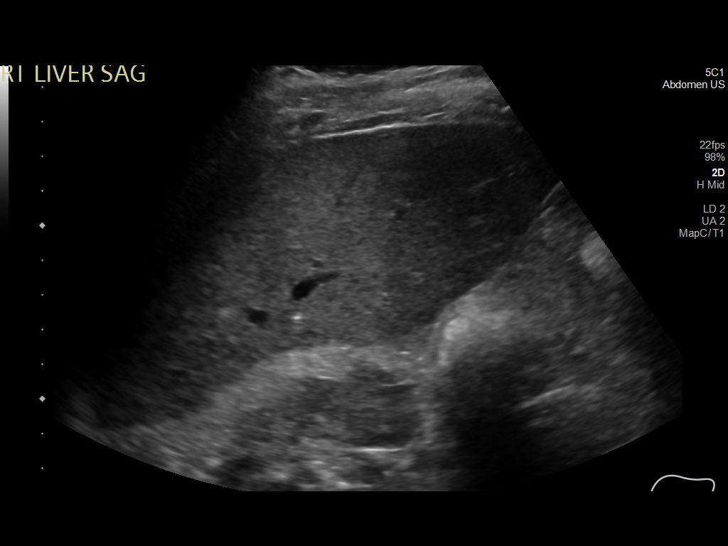
[im 36/62]
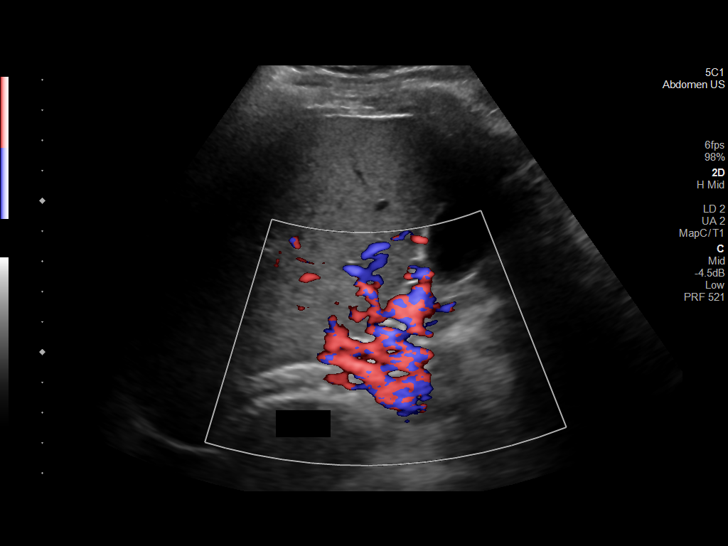
[im 41/62]
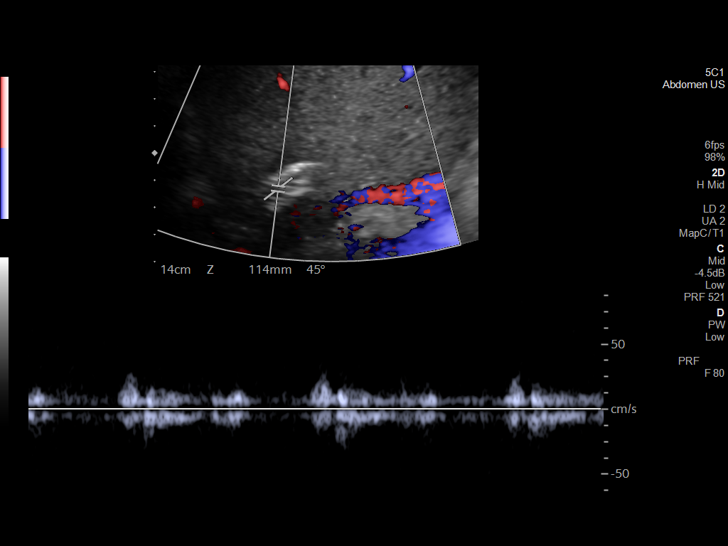
[im 46/62]
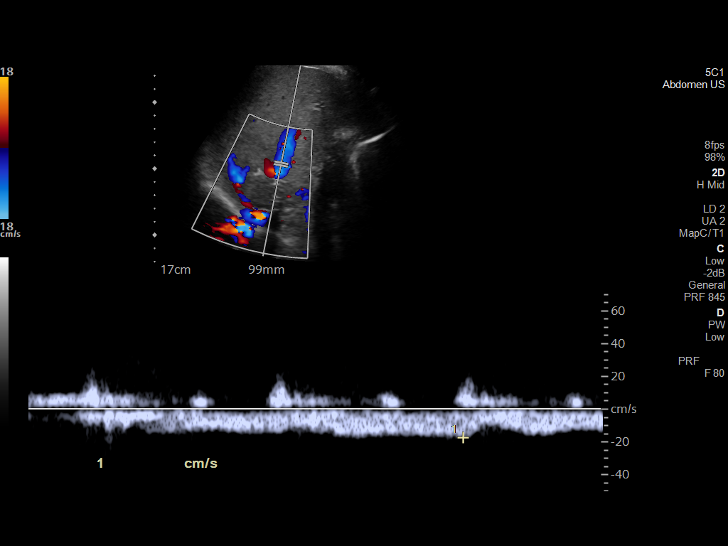
[im 51/62]
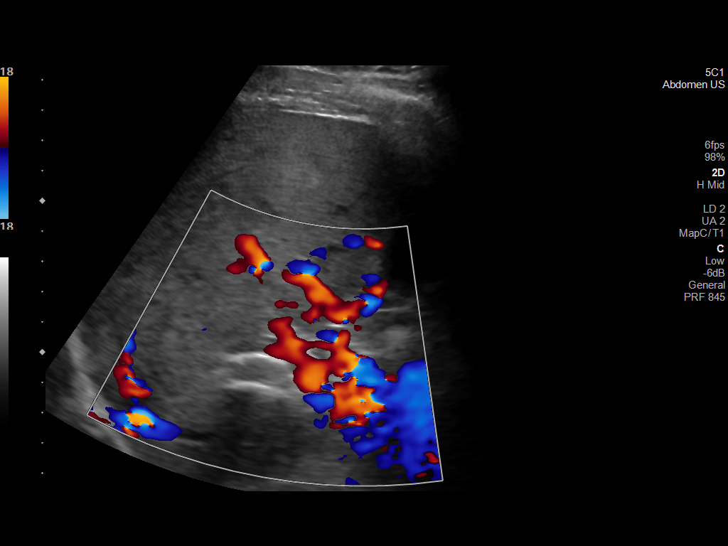
[im 56/62]
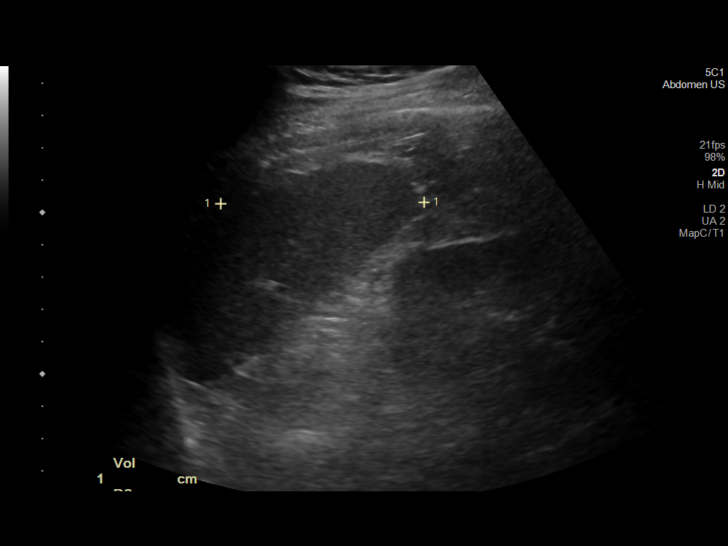
[im 62/62]
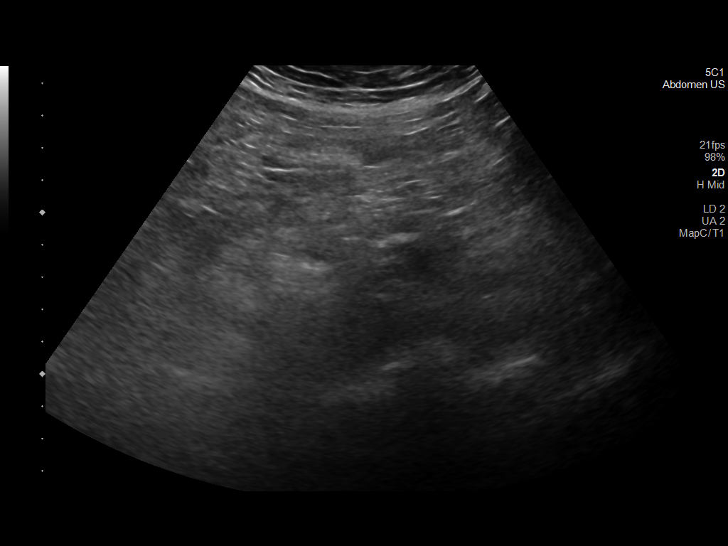

[13 of 25 positions shown; findings below may reference images not displayed]

FINDINGS: Liver: Normal parenchymal echogenicity. Normal hepatic contour
without nodularity.

No focal lesion, mass or intrahepatic biliary ductal dilatation.

Main Portal Vein size: Cavernous transformation noted.

Portal Vein Velocities

Unable to measure due to extensive cavernous transformation. The
indwelling tips stent is occluded.

Hepatic Vein Velocities

Right:  26 cm/sec

Middle:  17 cm/sec

Left:  24 cm/sec

IVC: Present and patent with normal respiratory phasicity.

Hepatic Artery Velocity: Not well visualized.

Splenic Vein Velocity:  12 cm/sec

Spleen: 6.3 cm

Portal Vein Occlusion/Thrombus: Yes, chronic, cavernous
transformation

Splenic Vein Occlusion/Thrombus: No

Ascites: None

Varices: None
IMPRESSION: Limited evaluation of the portal veins secondary to chronic portal
and TIPS thrombosis with extensive cavernous transformation of the
portal vasculature.

Recommend multiphase CTA (BRTO protocol) abdomen and pelvis for
further characterization.

## 2022-10-16 IMAGING — CT CT CTA ABD/PEL W/CM AND/OR W/O CM
2 of 9 series · 13 of 46 positions shown, 15 images · IV contrast (omnipaque)
Comparison: CT 04/13/2020

CLINICAL DATA: Portal vein thrombosis, BRTO protocol ordered

EXAM:
CTA ABDOMEN AND PELVIS WITHOUT AND WITH CONTRAST
TECHNIQUE: Multidetector CT imaging of the abdomen and pelvis was performed
using the standard protocol during bolus administration of
intravenous contrast. Multiplanar reconstructed images and MIPs were
obtained and reviewed to evaluate the vascular anatomy.
CONTRAST:  100mL OMNIPAQUE IOHEXOL 350 MG/ML SOLN

[Series 6: arterial 2.0 cor · coronal · arterial · 0.77mm/px · 3 of 131 slices shown]
[im 33/131  soft-tissue]
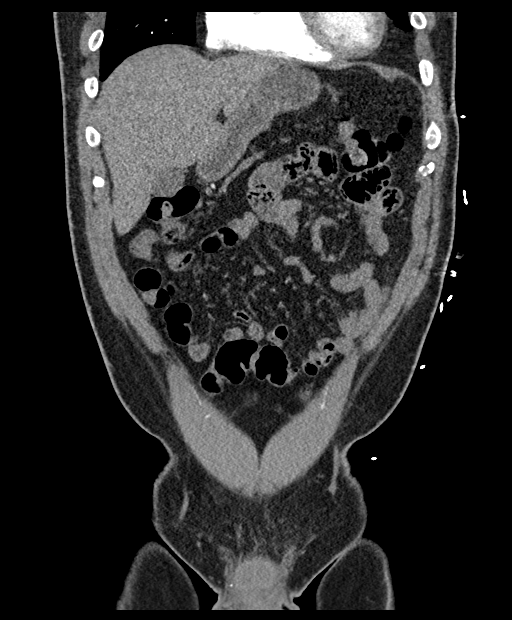
[im 66/131  soft-tissue]
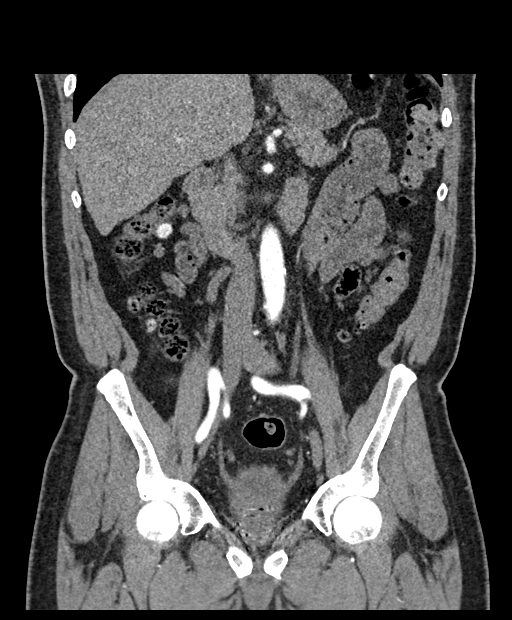
[im 98/131  soft-tissue]
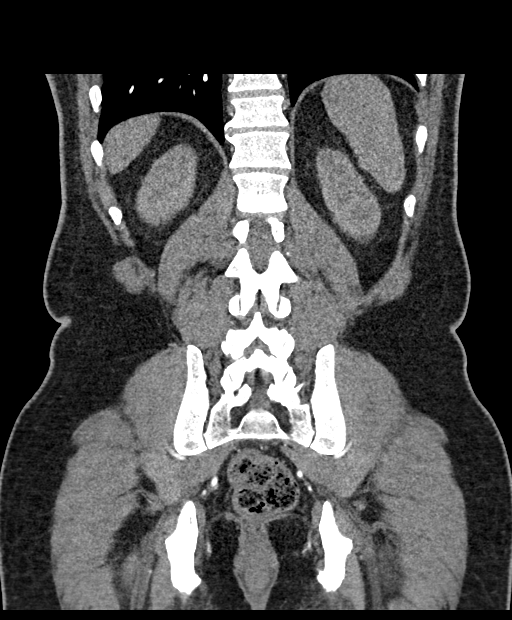

[Series 10: portal venous thins · axial · portal-venous · 0.77mm/px · z∈[+933,+1307]mm · 10 of 229 slices shown, 12 images]
[im 21/229  soft-tissue]
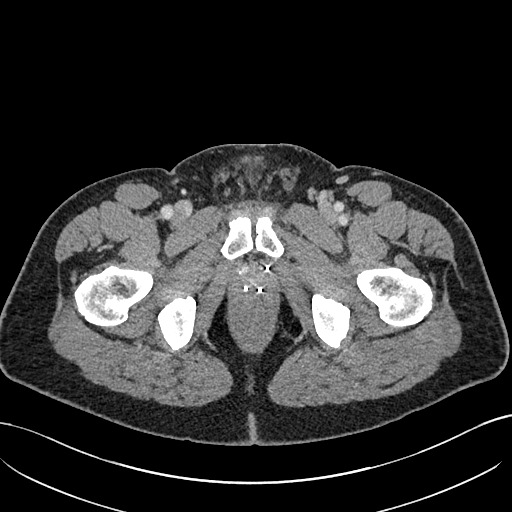
[im 21/229  bone]
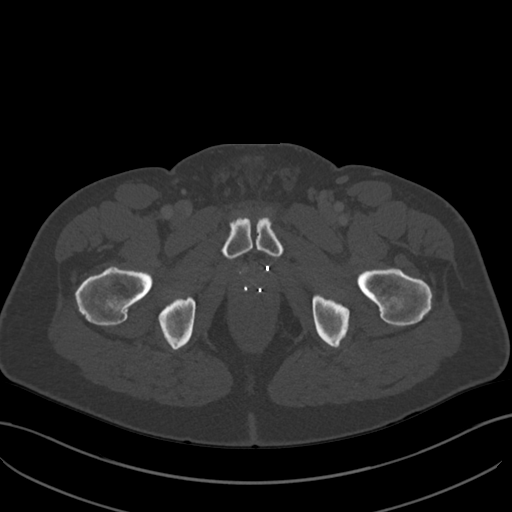
[im 42/229  soft-tissue]
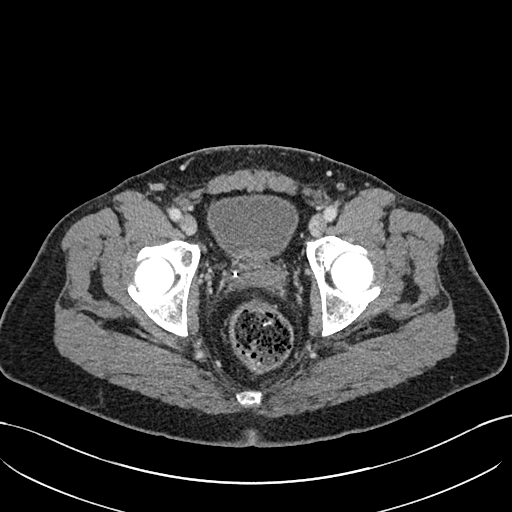
[im 63/229  soft-tissue]
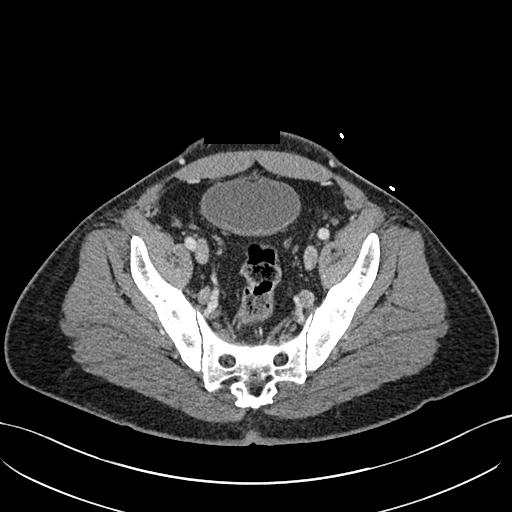
[im 83/229  soft-tissue]
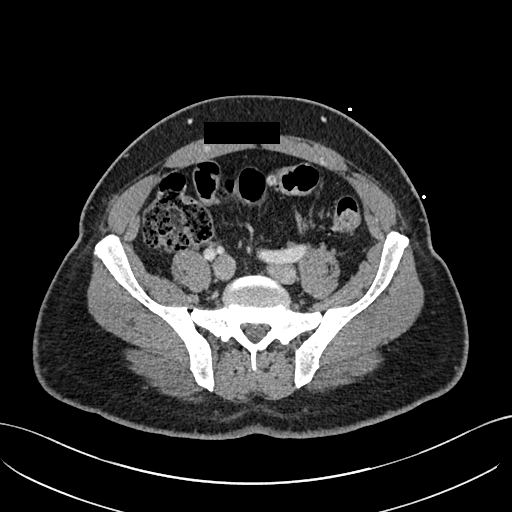
[im 104/229  soft-tissue]
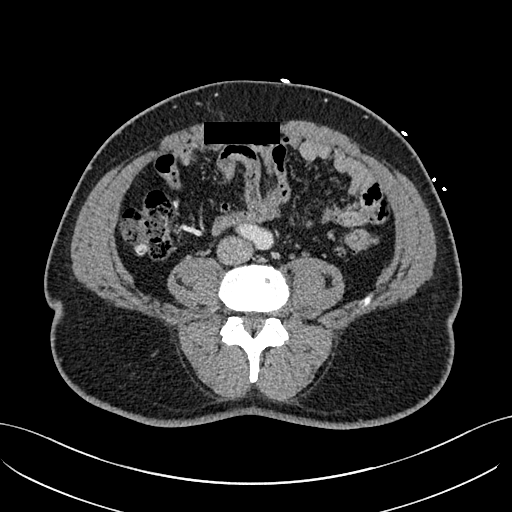
[im 125/229  soft-tissue]
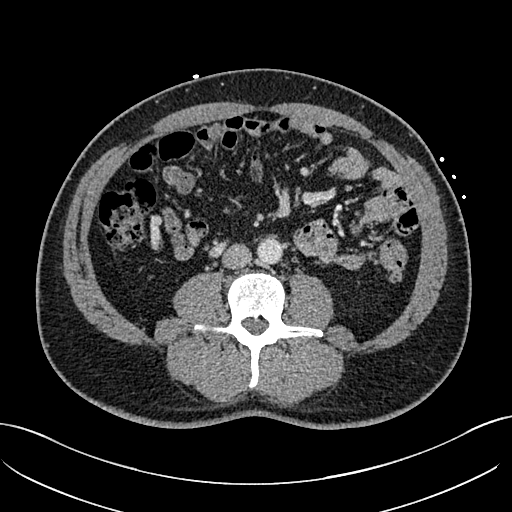
[im 146/229  soft-tissue]
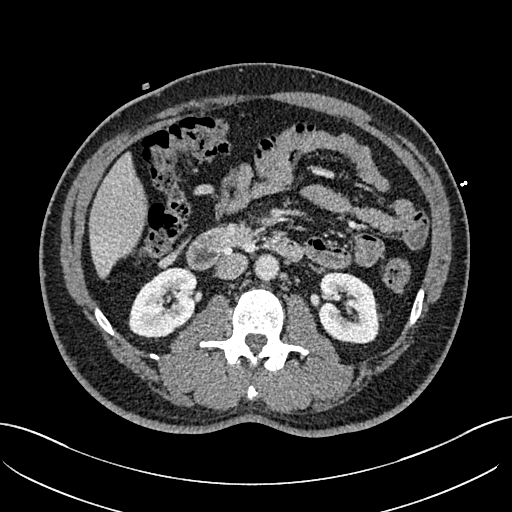
[im 166/229  soft-tissue]
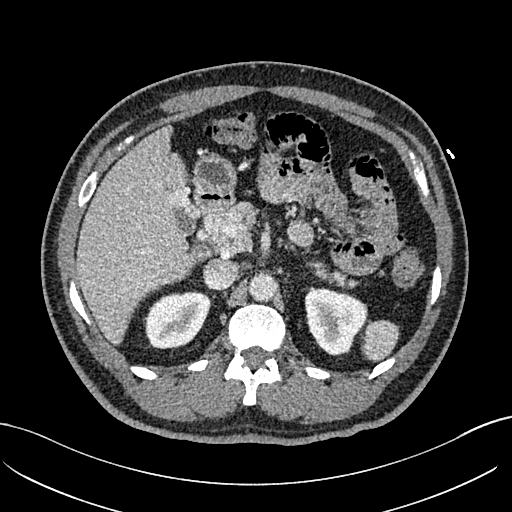
[im 187/229  soft-tissue]
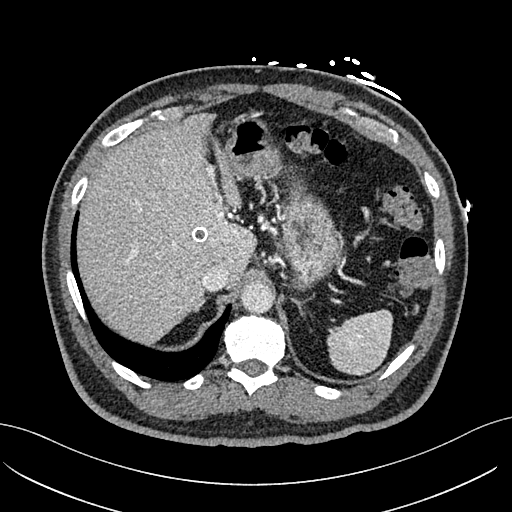
[im 187/229  bone]
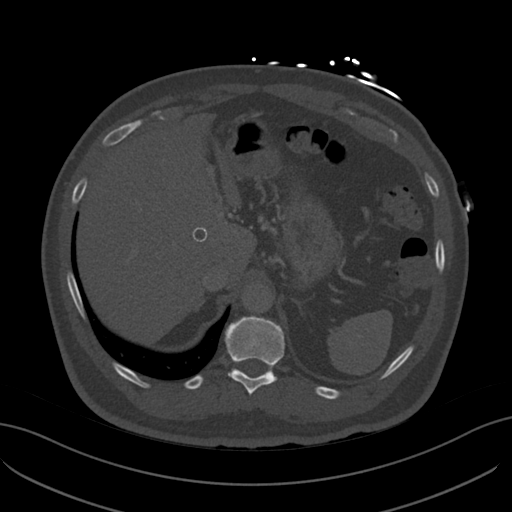
[im 208/229  soft-tissue]
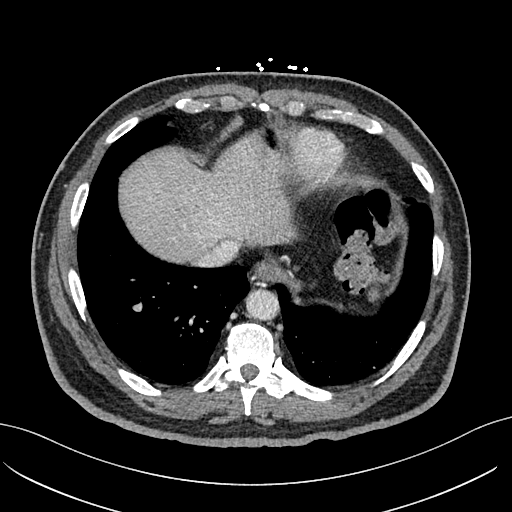

[13 of 46 positions shown; findings below may reference images not displayed]

FINDINGS: VASCULAR

Aorta: Normal caliber aorta without aneurysm, dissection, vasculitis
or significant stenosis.

Celiac: Patent and normally opacified.  Typical branching pattern.

SMA: Patent without evidence of aneurysm, dissection, vasculitis or
significant stenosis.

Renals: Both renal arteries are patent without evidence of aneurysm,
dissection, vasculitis, fibromuscular dysplasia or significant
stenosis.

IMA: Moderate ostial narrowing at the IMA origin likely secondary to
atheromatous plaque. Vessel otherwise normally opacified with normal
branching. No acute luminal abnormality. No features of vasculitis.

Inflow: Patent without evidence of aneurysm, dissection, vasculitis
or significant stenosis.

Proximal Outflow: Bilateral common femoral and visualized portions
of the superficial and profunda femoral arteries are patent without
evidence of aneurysm, dissection, vasculitis or significant
stenosis.

Veins: Redemonstration of a TIPS which appears occluded on portal
venous phase imaging. Additional note made of chronic portal vein
occlusion and cavernous transformation at the porta hepatis ([DATE]).
Additional upper abdominal venous collateralization including
paraesophageal and gastric collaterals as well as few sites
concerning for possible esophageal varices ([DATE]). No other major
venous abnormalities or significant occlusions.

Review of the MIP images confirms the above findings.

NON-VASCULAR

Lower chest: Lung bases are clear. Normal heart size. No pericardial
effusion.

Hepatobiliary: Diffuse hepatic hypoattenuation, similar prior. No
focal concerning liver lesion. No visible gallstones within the
gallbladder lumen. No pericholecystic fluid or inflammation.
Multiple venous collaterals about the gallbladder fossa. No visible
intraductal gallstones or biliary ductal dilatation.

Pancreas: No pancreatic ductal dilatation or surrounding
inflammatory changes.

Spleen: Normal in size. No concerning splenic lesions.

Adrenals/Urinary Tract: Normal adrenals. Kidneys are normally
located with symmetric enhancement across the multiple phase of
imaging. No suspicious renal lesion, urolithiasis or hydronephrosis.
Mild chronic thickening of the urinary bladder, may be related to
prostatomegaly and outlet obstruction.

Stomach/Bowel: Paraesophageal and gastric venous collateralization
and possible varices, as detailed above. No significant wall
thickening or inflammation. No sites of contrast accumulation.
Duodenum with a normal sweep across the midline abdomen. No small
bowel thickening or dilatation. Normal, noninflamed appendix
extending from the cecal tip towards the midline abdomen. Pancolonic
diverticulosis without focal inflammation to suggest an active/acute
diverticulitis. No other colonic thickening or dilatation is seen
either.

Lymphatic: No suspicious or enlarged lymph nodes in the included
lymphatic chains.

Reproductive: Prostatomegaly. There has been interval placement of
multiple metallic prostate brachytherapy implants. No acute
complication or concerning abnormality the prostate or seminal
vesicles is evident on CT imaging. No acute abnormality of the
included portions of the external genitalia.

Other: No abdominopelvic free fluid or free gas. No bowel containing
hernias.

Musculoskeletal: No acute osseous abnormality or suspicious osseous
lesion. Multilevel degenerative changes are present in the imaged
portions of the spine.
IMPRESSION: VASCULAR

1. TIPS catheter remains occluded
2. Chronic occlusion of the portal vein with cavernous
transformation at the porta hepatis.
3. Additional upper abdominal venous collateralization is seen about
the esophagus, stomach and gallbladder fossa. Suspect esophageal
varices as well.
4. No acute aortic abnormality.
5.  Aortic Atherosclerosis ([id number]).
6. Moderate ostial narrowing at the IMA origin.

NON-VASCULAR

1. Interval placement of multiple metallic prostate brachytherapy
implants. No acute complication.
2. Prostatomegaly. Chronic bladder wall thickening likely the result
of outlet obstruction though could correlate with urinalysis as
warranted.
3. Diffuse hepatic hypoattenuation, similar to prior. Can be seen
with fatty infiltration or intrinsic liver disease.

## 2022-10-16 NOTE — Telephone Encounter (Signed)
Lvm notifying pt that Dr Allena Katz said it is the only way to get rid of the fungus but it is not needed.

## 2022-11-28 ENCOUNTER — Other Ambulatory Visit: Payer: Self-pay | Admitting: Hematology and Oncology

## 2022-11-28 DIAGNOSIS — I8290 Acute embolism and thrombosis of unspecified vein: Secondary | ICD-10-CM

## 2022-12-01 ENCOUNTER — Inpatient Hospital Stay: Payer: PPO | Attending: Hematology and Oncology | Admitting: Hematology and Oncology

## 2022-12-01 ENCOUNTER — Inpatient Hospital Stay: Payer: PPO

## 2022-12-01 VITALS — BP 143/93 | HR 73 | Temp 97.7°F | Resp 17 | Wt 211.7 lb

## 2022-12-01 DIAGNOSIS — Z79899 Other long term (current) drug therapy: Secondary | ICD-10-CM | POA: Insufficient documentation

## 2022-12-01 DIAGNOSIS — I8290 Acute embolism and thrombosis of unspecified vein: Secondary | ICD-10-CM

## 2022-12-01 DIAGNOSIS — C61 Malignant neoplasm of prostate: Secondary | ICD-10-CM | POA: Insufficient documentation

## 2022-12-01 DIAGNOSIS — D6861 Antiphospholipid syndrome: Secondary | ICD-10-CM | POA: Diagnosis not present

## 2022-12-01 DIAGNOSIS — Z7901 Long term (current) use of anticoagulants: Secondary | ICD-10-CM | POA: Diagnosis not present

## 2022-12-01 DIAGNOSIS — Z8546 Personal history of malignant neoplasm of prostate: Secondary | ICD-10-CM | POA: Diagnosis not present

## 2022-12-01 DIAGNOSIS — Z86718 Personal history of other venous thrombosis and embolism: Secondary | ICD-10-CM | POA: Diagnosis present

## 2022-12-01 LAB — CBC WITH DIFFERENTIAL (CANCER CENTER ONLY)
Abs Immature Granulocytes: 0 10*3/uL (ref 0.00–0.07)
Basophils Absolute: 0 10*3/uL (ref 0.0–0.1)
Basophils Relative: 1 %
Eosinophils Absolute: 0 10*3/uL (ref 0.0–0.5)
Eosinophils Relative: 1 %
HCT: 38.1 % — ABNORMAL LOW (ref 39.0–52.0)
Hemoglobin: 12.7 g/dL — ABNORMAL LOW (ref 13.0–17.0)
Immature Granulocytes: 0 %
Lymphocytes Relative: 37 %
Lymphs Abs: 1.2 10*3/uL (ref 0.7–4.0)
MCH: 27.4 pg (ref 26.0–34.0)
MCHC: 33.3 g/dL (ref 30.0–36.0)
MCV: 82.1 fL (ref 80.0–100.0)
Monocytes Absolute: 0.4 10*3/uL (ref 0.1–1.0)
Monocytes Relative: 11 %
Neutro Abs: 1.6 10*3/uL — ABNORMAL LOW (ref 1.7–7.7)
Neutrophils Relative %: 50 %
Platelet Count: 227 10*3/uL (ref 150–400)
RBC: 4.64 MIL/uL (ref 4.22–5.81)
RDW: 14.8 % (ref 11.5–15.5)
WBC Count: 3.2 10*3/uL — ABNORMAL LOW (ref 4.0–10.5)
nRBC: 0 % (ref 0.0–0.2)

## 2022-12-01 LAB — PROTIME-INR
INR: 3.3 — ABNORMAL HIGH (ref 0.8–1.2)
Prothrombin Time: 34 s — ABNORMAL HIGH (ref 11.4–15.2)

## 2022-12-01 NOTE — Progress Notes (Addendum)
Patient Care Team: Knox Royalty, MD as PCP - General (Family Medicine) Felicita Gage, RN as Oncology Nurse Navigator  DIAGNOSIS:  Encounter Diagnosis  Name Primary?   Antiphospholipid antibody syndrome (HCC) Yes    SUMMARY OF ONCOLOGIC HISTORY: Oncology History  Malignant neoplasm of prostate (HCC)  01/30/2020 Cancer Staging   Staging form: Prostate, AJCC 8th Edition - Clinical stage from 01/30/2020: Stage IIB (cT1c, cN0, cM0, PSA: 10, Grade Group: 2) - Signed by [name], PA-C on 03/27/2020 Histopathologic type: Adenocarcinoma, NOS Stage prefix: Initial diagnosis Prostate specific antigen (PSA) range: 10 to 19 Gleason primary pattern: 3 Gleason secondary pattern: 4 Gleason score: 7 Histologic grading system: 5 grade system Number of biopsy cores examined: 12 Number of biopsy cores positive: 3 Location of positive needle core biopsies: Both sides   03/27/2020 Initial Diagnosis   Malignant neoplasm of prostate (HCC)     CHIEF COMPLIANT: Antiphospholipid antibody syndrome on Coumadin  History of Present Illness   The patient, with a history of portal vein thrombosis, presents for a routine follow-up. He has been on Coumadin 5 and 10 for three years since his diagnosis. He reports no issues with taking the medication and denies any bleeding complications such as nosebleeds or gum bleeds. He does note a slight taste of blood when brushing his teeth, but denies any changes in urine or bowel movements. The patient's INR has been fairly stable, with the last check in August showing good control. He is currently having labs checked every two months, but there is discussion of moving to every three months if the current labs are satisfactory.  The patient also reports taking pravastatin and vitamin D, but denies taking any other medications. He has refills for his Coumadin until next August.         ALLERGIES:  is allergic to lisinopril.  MEDICATIONS:  Current Outpatient  Medications  Medication Sig Dispense Refill   Multiple Vitamin (MULTIVITAMIN) tablet Take 1 tablet by mouth daily.     pravastatin (PRAVACHOL) 20 MG tablet Take 20 mg by mouth daily.     Vitamin D, Ergocalciferol, (DRISDOL) 1.25 MG (50000 UNIT) CAPS capsule Take 50,000 Units by mouth once a week.     warfarin (COUMADIN) 5 MG tablet Take 1 tablet (5 mg total) by mouth daily. TAKE 5MG  ON WEDNESDAY, THURSDAY, FRIDAY, SATURDAY, SUNDAY, ALL OTHER DAYS TAKE 10MG  120 tablet 3   Blood Glucose Monitoring Suppl (CONTOUR NEXT EZ) w/Device KIT See admin instructions.     CONTOUR NEXT TEST test strip daily.     No current facility-administered medications for this visit.    PHYSICAL EXAMINATION: ECOG PERFORMANCE STATUS: 1 - Symptomatic but completely ambulatory  Vitals:   12/01/22 1349  BP: (!) 143/93  Pulse: 73  Resp: 17  Temp: 97.7 F (36.5 C)  SpO2: 100%   Filed Weights   12/01/22 1349  Weight: 211 lb 11.2 oz (96 kg)      LABORATORY DATA:  I have reviewed the data as listed    Latest Ref Rng & Units 05/14/2022    2:07 PM 01/02/2022    4:45 PM 12/26/2020    1:36 AM  CMP  Glucose 70 - 99 mg/dL   161   BUN 8 - 23 mg/dL   15   Creatinine 0.96 - 1.24 mg/dL   0.45   Sodium 409 - 811 mmol/L   137   Potassium 3.5 - 5.1 mmol/L   4.7   Chloride 98 - 111 mmol/L  105   CO2 22 - 32 mmol/L   24   Calcium 8.9 - 10.3 mg/dL   9.1   Total Protein 6.1 - 8.1 g/dL 7.2  7.0  6.4   Total Bilirubin 0.2 - 1.2 mg/dL 0.6  1.0  1.6   Alkaline Phos 38 - 126 U/L   45   AST 10 - 35 U/L 26  29  55   ALT 9 - 46 U/L 30  31  42     Lab Results  Component Value Date   WBC 3.2 (L) 12/01/2022   HGB 12.7 (L) 12/01/2022   HCT 38.1 (L) 12/01/2022   MCV 82.1 12/01/2022   PLT 227 12/01/2022   NEUTROABS 1.6 (L) 12/01/2022    ASSESSMENT & PLAN:  Antiphospholipid antibody syndrome (HCC) 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 at [facility] when he presented with abdominal pain (positive for  lupus anticoagulant) SMV thrombosis Splenic vein thrombosis   Current treatment: Switched to Coumadin 01/09/2020 (when he was diagnosed with positive lupus anticoagulant) Coumadin toxicities:  GI bleeding 05/17/2021: Today's hemoglobin is 13.2   Hospitalization:  01/09/2020-01/10/2020 attempted TIPS recannulization procedure in IR, unsuccessful 12/25/2020-12/26/2020: Successful balloon assisted sharp recannulization of the portal vein, balloon angioplasty of the portal vein, TIPS endograft relining/stent placement, portal and splenic vein stent placement and coil embolization of splenic parenchyma with access tract   Coumadin dose: 5 mg alternating with 10 mg Return to clinic every 2 months for labs and in 12 months for follow-up with me    No orders of the defined types were placed in this encounter.  The patient has a good understanding of the overall plan. he agrees with it. he will call with any problems that may develop before the next visit here. Total time spent: 30 mins including face to face time and time spent for planning, charting and co-ordination of care   Tamsen Meek, MD 12/01/22   Addendum: I called the patient to provide him with the INR of 3.3.  I left a voicemail.  Instructed him to reduce the Coumadin dose of from 10 mg to 5 mg so he will take 5 mg tablets for 6 days and 10 mg on one of the days.

## 2022-12-01 NOTE — Assessment & Plan Note (Signed)
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 at [facility] when he presented with abdominal pain (positive for lupus anticoagulant) SMV thrombosis Splenic vein thrombosis   Current treatment: Switched to Coumadin 01/09/2020 (when he was diagnosed with positive lupus anticoagulant) Coumadin toxicities:  GI bleeding 05/17/2021: Today's hemoglobin is 13.2   Hospitalization:  01/09/2020-01/10/2020 attempted TIPS recannulization procedure in IR, unsuccessful 12/25/2020-12/26/2020: Successful balloon assisted sharp recannulization of the portal vein, balloon angioplasty of the portal vein, TIPS endograft relining/stent placement, portal and splenic vein stent placement and coil embolization of splenic parenchyma with access tract   Coumadin dose: 5 mg alternating with 10 mg Return to clinic every 2 months for labs and in 12 months for follow-up with me

## 2023-01-21 ENCOUNTER — Other Ambulatory Visit: Payer: Self-pay | Admitting: *Deleted

## 2023-01-21 DIAGNOSIS — D6861 Antiphospholipid syndrome: Secondary | ICD-10-CM

## 2023-01-26 ENCOUNTER — Inpatient Hospital Stay: Payer: PPO | Attending: Hematology and Oncology

## 2023-01-26 DIAGNOSIS — Z86718 Personal history of other venous thrombosis and embolism: Secondary | ICD-10-CM | POA: Diagnosis present

## 2023-01-26 DIAGNOSIS — D6861 Antiphospholipid syndrome: Secondary | ICD-10-CM | POA: Diagnosis present

## 2023-01-26 LAB — CBC WITH DIFFERENTIAL (CANCER CENTER ONLY)
Abs Immature Granulocytes: 0 10*3/uL (ref 0.00–0.07)
Basophils Absolute: 0 10*3/uL (ref 0.0–0.1)
Basophils Relative: 1 %
Eosinophils Absolute: 0 10*3/uL (ref 0.0–0.5)
Eosinophils Relative: 1 %
HCT: 39 % (ref 39.0–52.0)
Hemoglobin: 12.9 g/dL — ABNORMAL LOW (ref 13.0–17.0)
Immature Granulocytes: 0 %
Lymphocytes Relative: 36 %
Lymphs Abs: 1.3 10*3/uL (ref 0.7–4.0)
MCH: 27.2 pg (ref 26.0–34.0)
MCHC: 33.1 g/dL (ref 30.0–36.0)
MCV: 82.3 fL (ref 80.0–100.0)
Monocytes Absolute: 0.5 10*3/uL (ref 0.1–1.0)
Monocytes Relative: 15 %
Neutro Abs: 1.7 10*3/uL (ref 1.7–7.7)
Neutrophils Relative %: 47 %
Platelet Count: 218 10*3/uL (ref 150–400)
RBC: 4.74 MIL/uL (ref 4.22–5.81)
RDW: 14.8 % (ref 11.5–15.5)
WBC Count: 3.5 10*3/uL — ABNORMAL LOW (ref 4.0–10.5)
nRBC: 0 % (ref 0.0–0.2)

## 2023-01-26 LAB — PROTIME-INR
INR: 2.6 — ABNORMAL HIGH (ref 0.8–1.2)
Prothrombin Time: 28.5 s — ABNORMAL HIGH (ref 11.4–15.2)

## 2023-04-28 ENCOUNTER — Other Ambulatory Visit: Payer: Self-pay | Admitting: *Deleted

## 2023-04-28 DIAGNOSIS — D6861 Antiphospholipid syndrome: Secondary | ICD-10-CM

## 2023-04-29 ENCOUNTER — Inpatient Hospital Stay: Payer: PPO | Attending: Hematology and Oncology

## 2023-04-29 DIAGNOSIS — D6861 Antiphospholipid syndrome: Secondary | ICD-10-CM | POA: Insufficient documentation

## 2023-04-29 DIAGNOSIS — Z86718 Personal history of other venous thrombosis and embolism: Secondary | ICD-10-CM | POA: Diagnosis present

## 2023-04-29 DIAGNOSIS — Z79818 Long term (current) use of other agents affecting estrogen receptors and estrogen levels: Secondary | ICD-10-CM | POA: Diagnosis not present

## 2023-04-29 LAB — CBC WITH DIFFERENTIAL (CANCER CENTER ONLY)
Abs Immature Granulocytes: 0.04 10*3/uL (ref 0.00–0.07)
Basophils Absolute: 0 10*3/uL (ref 0.0–0.1)
Basophils Relative: 1 %
Eosinophils Absolute: 0 10*3/uL (ref 0.0–0.5)
Eosinophils Relative: 1 %
HCT: 39 % (ref 39.0–52.0)
Hemoglobin: 12.8 g/dL — ABNORMAL LOW (ref 13.0–17.0)
Immature Granulocytes: 1 %
Lymphocytes Relative: 38 %
Lymphs Abs: 1.5 10*3/uL (ref 0.7–4.0)
MCH: 26.7 pg (ref 26.0–34.0)
MCHC: 32.8 g/dL (ref 30.0–36.0)
MCV: 81.4 fL (ref 80.0–100.0)
Monocytes Absolute: 0.4 10*3/uL (ref 0.1–1.0)
Monocytes Relative: 9 %
Neutro Abs: 1.9 10*3/uL (ref 1.7–7.7)
Neutrophils Relative %: 50 %
Platelet Count: 234 10*3/uL (ref 150–400)
RBC: 4.79 MIL/uL (ref 4.22–5.81)
RDW: 14.4 % (ref 11.5–15.5)
WBC Count: 3.8 10*3/uL — ABNORMAL LOW (ref 4.0–10.5)
nRBC: 0 % (ref 0.0–0.2)

## 2023-04-29 LAB — PROTIME-INR
INR: 3 — ABNORMAL HIGH (ref 0.8–1.2)
Prothrombin Time: 31.6 s — ABNORMAL HIGH (ref 11.4–15.2)

## 2023-07-21 ENCOUNTER — Emergency Department (HOSPITAL_BASED_OUTPATIENT_CLINIC_OR_DEPARTMENT_OTHER)

## 2023-07-21 ENCOUNTER — Encounter (HOSPITAL_BASED_OUTPATIENT_CLINIC_OR_DEPARTMENT_OTHER): Payer: Self-pay

## 2023-07-21 ENCOUNTER — Other Ambulatory Visit: Payer: Self-pay

## 2023-07-21 ENCOUNTER — Emergency Department (HOSPITAL_BASED_OUTPATIENT_CLINIC_OR_DEPARTMENT_OTHER)
Admission: EM | Admit: 2023-07-21 | Discharge: 2023-07-21 | Disposition: A | Discharge status: Home / Self Care | Attending: Emergency Medicine | Admitting: Emergency Medicine

## 2023-07-21 DIAGNOSIS — R109 Unspecified abdominal pain: Secondary | ICD-10-CM | POA: Diagnosis not present

## 2023-07-21 DIAGNOSIS — R519 Headache, unspecified: Secondary | ICD-10-CM | POA: Insufficient documentation

## 2023-07-21 DIAGNOSIS — Z7901 Long term (current) use of anticoagulants: Secondary | ICD-10-CM | POA: Diagnosis not present

## 2023-07-21 DIAGNOSIS — S20212A Contusion of left front wall of thorax, initial encounter: Secondary | ICD-10-CM

## 2023-07-21 DIAGNOSIS — Y92410 Unspecified street and highway as the place of occurrence of the external cause: Secondary | ICD-10-CM | POA: Insufficient documentation

## 2023-07-21 LAB — COMPREHENSIVE METABOLIC PANEL WITH GFR
ALT: 27 U/L (ref 0–44)
AST: 28 U/L (ref 15–41)
Albumin: 4.1 g/dL (ref 3.5–5.0)
Alkaline Phosphatase: 60 U/L (ref 38–126)
Anion gap: 11 (ref 5–15)
BUN: 15 mg/dL (ref 8–23)
CO2: 25 mmol/L (ref 22–32)
Calcium: 9.4 mg/dL (ref 8.9–10.3)
Chloride: 107 mmol/L (ref 98–111)
Creatinine, Ser: 1.25 mg/dL — ABNORMAL HIGH (ref 0.61–1.24)
GFR, Estimated: >60 mL/min (ref >60)
Glucose, Bld: 106 mg/dL — ABNORMAL HIGH (ref 70–99)
Potassium: 3.9 mmol/L (ref 3.5–5.1)
Sodium: 142 mmol/L (ref 135–145)
Total Bilirubin: 0.9 mg/dL (ref 0.0–1.2)
Total Protein: 7.3 g/dL (ref 6.5–8.1)

## 2023-07-21 LAB — CBC WITH DIFFERENTIAL/PLATELET
Abs Immature Granulocytes: 0.01 10*3/uL (ref 0.00–0.07)
Basophils Absolute: 0 10*3/uL (ref 0.0–0.1)
Basophils Relative: 1 %
Eosinophils Absolute: 0.1 10*3/uL (ref 0.0–0.5)
Eosinophils Relative: 3 %
HCT: 38.3 % — ABNORMAL LOW (ref 39.0–52.0)
Hemoglobin: 12.9 g/dL — ABNORMAL LOW (ref 13.0–17.0)
Immature Granulocytes: 0 %
Lymphocytes Relative: 36 %
Lymphs Abs: 1.5 10*3/uL (ref 0.7–4.0)
MCH: 27 pg (ref 26.0–34.0)
MCHC: 33.7 g/dL (ref 30.0–36.0)
MCV: 80.1 fL (ref 80.0–100.0)
Monocytes Absolute: 0.5 10*3/uL (ref 0.1–1.0)
Monocytes Relative: 12 %
Neutro Abs: 2 10*3/uL (ref 1.7–7.7)
Neutrophils Relative %: 48 %
Platelets: 244 10*3/uL (ref 150–400)
RBC: 4.78 MIL/uL (ref 4.22–5.81)
RDW: 14.5 % (ref 11.5–15.5)
WBC: 4.1 10*3/uL (ref 4.0–10.5)
nRBC: 0 % (ref 0.0–0.2)

## 2023-07-21 LAB — TROPONIN T, HIGH SENSITIVITY
Troponin T High Sensitivity: <15 ng/L (ref <19)
Troponin T High Sensitivity: <15 ng/L (ref <19)

## 2023-07-21 LAB — PROTIME-INR
INR: 3 — ABNORMAL HIGH (ref 0.8–1.2)
Prothrombin Time: 31.7 s — ABNORMAL HIGH (ref 11.4–15.2)

## 2023-07-21 MED ORDER — IOHEXOL 300 MG/ML  SOLN
100.0000 mL | Freq: Once | INTRAMUSCULAR | Status: AC | PRN
  Administered 2023-07-21: 100 mL via INTRAVENOUS
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
## 2023-07-21 NOTE — Discharge Instructions (Signed)
 Testing is reassuring.  No serious traumatic injury identified on your CT scans.  Use Tylenol  as needed for aches and for pains.  Follow-up with your doctor.  Return to the ED with new or worsening symptoms.
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
## 2023-07-29 ENCOUNTER — Telehealth: Payer: Self-pay | Admitting: Hematology and Oncology

## 2023-07-29 ENCOUNTER — Inpatient Hospital Stay: Payer: PPO | Attending: Hematology and Oncology

## 2023-07-29 DIAGNOSIS — Z7901 Long term (current) use of anticoagulants: Secondary | ICD-10-CM | POA: Diagnosis not present

## 2023-07-29 DIAGNOSIS — D6861 Antiphospholipid syndrome: Secondary | ICD-10-CM | POA: Diagnosis present

## 2023-07-29 DIAGNOSIS — Z86718 Personal history of other venous thrombosis and embolism: Secondary | ICD-10-CM | POA: Diagnosis present

## 2023-07-29 LAB — CBC WITH DIFFERENTIAL (CANCER CENTER ONLY)
Abs Immature Granulocytes: 0 10*3/uL (ref 0.00–0.07)
Basophils Absolute: 0 10*3/uL (ref 0.0–0.1)
Basophils Relative: 1 %
Eosinophils Absolute: 0.1 10*3/uL (ref 0.0–0.5)
Eosinophils Relative: 2 %
HCT: 37.3 % — ABNORMAL LOW (ref 39.0–52.0)
Hemoglobin: 12.6 g/dL — ABNORMAL LOW (ref 13.0–17.0)
Immature Granulocytes: 0 %
Lymphocytes Relative: 38 %
Lymphs Abs: 1.4 10*3/uL (ref 0.7–4.0)
MCH: 26.6 pg (ref 26.0–34.0)
MCHC: 33.8 g/dL (ref 30.0–36.0)
MCV: 78.9 fL — ABNORMAL LOW (ref 80.0–100.0)
Monocytes Absolute: 0.5 10*3/uL (ref 0.1–1.0)
Monocytes Relative: 15 %
Neutro Abs: 1.6 10*3/uL — ABNORMAL LOW (ref 1.7–7.7)
Neutrophils Relative %: 44 %
Platelet Count: 215 10*3/uL (ref 150–400)
RBC: 4.73 MIL/uL (ref 4.22–5.81)
RDW: 14.8 % (ref 11.5–15.5)
WBC Count: 3.7 10*3/uL — ABNORMAL LOW (ref 4.0–10.5)
nRBC: 0 % (ref 0.0–0.2)

## 2023-07-29 LAB — PROTIME-INR
INR: 4 — ABNORMAL HIGH (ref 0.8–1.2)
Prothrombin Time: 39.1 s — ABNORMAL HIGH (ref 11.4–15.2)

## 2023-07-29 NOTE — Telephone Encounter (Signed)
 INR 4 Recommended reducing the Coumadin  to 5 mg daily I also suggested that he does not take Coumadin  for the next 2 days. He has an appointment in September for follow-up with labs

## 2023-08-06 ENCOUNTER — Other Ambulatory Visit: Payer: Self-pay | Admitting: Hematology and Oncology

## 2023-09-04 ENCOUNTER — Emergency Department (HOSPITAL_BASED_OUTPATIENT_CLINIC_OR_DEPARTMENT_OTHER)
Admission: EM | Admit: 2023-09-04 | Discharge: 2023-09-04 | Disposition: A | Discharge status: Home / Self Care | Attending: Emergency Medicine | Admitting: Emergency Medicine

## 2023-09-04 ENCOUNTER — Other Ambulatory Visit: Payer: Self-pay

## 2023-09-04 ENCOUNTER — Encounter (HOSPITAL_BASED_OUTPATIENT_CLINIC_OR_DEPARTMENT_OTHER): Payer: Self-pay

## 2023-09-04 DIAGNOSIS — Z7901 Long term (current) use of anticoagulants: Secondary | ICD-10-CM | POA: Diagnosis not present

## 2023-09-04 DIAGNOSIS — H109 Unspecified conjunctivitis: Secondary | ICD-10-CM | POA: Diagnosis not present

## 2023-09-04 DIAGNOSIS — H5789 Other specified disorders of eye and adnexa: Secondary | ICD-10-CM | POA: Diagnosis present

## 2023-09-04 MED ORDER — ERYTHROMYCIN 5 MG/GM OP OINT: TOPICAL_OINTMENT | OPHTHALMIC | 0 refills | Status: AC

## 2023-09-04 MED ORDER — FLUORESCEIN SODIUM 1 MG OP STRP
1.0000 | ORAL_STRIP | Freq: Once | OPHTHALMIC | Status: AC
  Administered 2023-09-04: 1 via OPHTHALMIC
  Filled 2023-09-04: qty 1

## 2023-09-04 MED ORDER — TETRACAINE HCL 0.5 % OP SOLN
2.0000 [drp] | Freq: Once | OPHTHALMIC | Status: AC
  Administered 2023-09-04: 2 [drp] via OPHTHALMIC
  Filled 2023-09-04: qty 4

## 2023-09-04 MED ORDER — ERYTHROMYCIN 5 MG/GM OP OINT
TOPICAL_OINTMENT | Freq: Once | OPHTHALMIC | Status: AC
Start: 2023-09-04 — End: 2023-09-04
  Filled 2023-09-04: qty 3.5

## 2023-09-04 NOTE — ED Notes (Signed)
 Pt has burst vessels and redness to medial sclera to L eye. No trauma or known cause per patient. No hx of issues in the last week, but 48 hours of burning.

## 2023-09-04 NOTE — ED Provider Notes (Signed)
 [place] [facility] Provider Note   CSN: [id number] Arrival date & time: 09/04/23  1932     Patient presents with: Eye Problem   Harry Lucero is a 68 y.o. male presents today for left eye redness and pain for the past 48 hours.  Patient thinks he may have gotten a foreign body in it but is not sure.  Patient endorses eye discharge and crusting shut in the morning.  Patient denies any other injury.  Patient currently taking latanoprost  for elevated eye pressure.  Patient denies vision changes, diplopia, pain with extraocular movement, fever, chills, any other complaints at this time.    Eye Problem Associated symptoms: discharge and redness        Prior to Admission medications   Medication Sig Start Date End Date Taking? Authorizing Provider  erythromycin  ophthalmic ointment Place a 1/2 inch ribbon of ointment into the lower eyelid 4-6 times daily for 7 days. 09/04/23  Yes Francis Ileana SAILOR, PA-C  Blood Glucose Monitoring Suppl (CONTOUR NEXT EZ) w/Device KIT See admin instructions. 03/02/20   [provider]  CONTOUR NEXT TEST test strip daily. 03/01/20   [provider]  Multiple Vitamin (MULTIVITAMIN) tablet Take 1 tablet by mouth daily.    [provider]  pravastatin  (PRAVACHOL ) 20 MG tablet Take 20 mg by mouth daily. 02/22/20   [provider]  Vitamin D , Ergocalciferol , (DRISDOL ) 1.25 MG (50000 UNIT) CAPS capsule Take 50,000 Units by mouth once a week. 06/10/20   [provider]  warfarin (COUMADIN ) 5 MG tablet TAKE 1 TABLET ON WEDNESDAY, THURSDAY, FRIDAY, SATURDAY. ON ALL OTHER DAYS TAKE 2 TABLETS 08/06/23   Odean Potts, MD  ranitidine  (ZANTAC ) 150 MG tablet Take 1 tablet (150 mg total) by mouth 2 (two) times daily. Patient not taking: Reported on 12/11/2019 03/19/18 12/11/19  Muthersbaugh, Chiquita, PA-C    Allergies: Lisinopril     Review of Systems  Eyes:  Positive for pain, discharge and redness.     Updated Vital Signs BP (!) 160/93 (BP Location: Left Arm)   Pulse 66   Temp 97.9 F (36.6 C) (Oral)   Resp 16   Ht 5' 8 (1.727 m)   Wt 95.3 kg   SpO2 98%   BMI 31.93 kg/m   Physical Exam Vitals and nursing note reviewed.  Constitutional:      General: He is not in acute distress.    Appearance: Normal appearance. He is well-developed. He is not ill-appearing.  HENT:     Head: Normocephalic and atraumatic.     Right Ear: External ear normal.     Left Ear: External ear normal.  Eyes:     General: Lids are everted, no foreign bodies appreciated. Vision grossly intact. Gaze aligned appropriately.        Right eye: No foreign body or discharge.        Left eye: Discharge present.No foreign body.     Intraocular pressure: Left eye pressure is 21 mmHg. Measurements were taken using a handheld tonometer.    Extraocular Movements: Extraocular movements intact.     Right eye: Normal extraocular motion.     Left eye: Normal extraocular motion.     Conjunctiva/sclera:     Left eye: Left conjunctiva is injected. No chemosis.    Pupils: Pupils are equal, round, and reactive to light.     Left eye: No corneal abrasion or fluorescein  uptake.     Comments: No erythema or swelling noted to  the periorbital region.  Cardiovascular:     Rate and Rhythm: Normal rate and regular rhythm.     Heart sounds: No murmur heard. Pulmonary:     Effort: Pulmonary effort is normal. No respiratory distress.     Breath sounds: Normal breath sounds.  Abdominal:     Palpations: Abdomen is soft.     Tenderness: There is no abdominal tenderness.  Musculoskeletal:        General: No swelling.     Cervical back: Neck supple.  Skin:    General: Skin is warm and dry.     Capillary Refill: Capillary refill takes less than 2 seconds.  Neurological:     Mental Status: He is alert.  Psychiatric:        Mood and Affect: Mood normal.     (all labs ordered are listed, but only abnormal results are  displayed) Labs Reviewed - No data to display  EKG: None  Radiology: No results found.   Procedures   Medications Ordered in the ED  erythromycin  ophthalmic ointment (has no administration in time range)  tetracaine  (PONTOCAINE) 0.5 % ophthalmic solution 2 drop (2 drops Left Eye Given by Other 09/04/23 2001)  fluorescein  ophthalmic strip 1 strip (1 strip Left Eye Given by Other 09/04/23 2001)                                    Medical Decision Making Risk Prescription drug management.   This patient presents to the ED for concern of left eye redness and pain differential diagnosis includes foreign body, corneal abrasion, conjunctivitis, orbital cellulitis, preseptal cellulitis   Medicines ordered and prescription drug management:  I ordered medication including erythromycin  ophthalmic ointment    I have reviewed the patients home medicines and have made adjustments as needed   Problem List / ED Course:  Considered for admission or further workup however patient's vital signs and physical exam are reassuring.  Patient's symptoms likely caused by acute conjunctivitis.  Patient given outpatient course of erythromycin  ophthalmic ointment and advised to follow-up with ophthalmology if his symptoms persist.  Patient given return precautions.  I feel patient safe for discharge at this time.      Final diagnoses:  Conjunctivitis of left eye, unspecified conjunctivitis type    ED Discharge Orders          Ordered    erythromycin  ophthalmic ointment        09/04/23 2124               Francis Ileana SAILOR, PA-C 09/04/23 2127    Zackowski, Scott, MD 09/09/23 [phone]

## 2023-09-04 NOTE — ED Triage Notes (Addendum)
 Pt states his left eye has become red & tender in the past 48 hours. Pt states he thinks he got something in it but doesn't know. Denies injury. Denies any other s/s at time of triage.  Pt has been taking latanoprost  0.005% drops for a year due to eye pressure. Denies vision changes in eye.

## 2023-09-04 NOTE — Discharge Instructions (Signed)
 Today you were seen for left eye redness and pain.  I suspect you likely have conjunctivitis.  Please pick up your medication and take as prescribed.  Please follow-up with their ophthalmologist if your symptoms persist for further evaluation and workup.  Thank you for letting us  treat you today. After performing a physical exam, I feel you are safe to go home. Please follow up with your PCP in the next several days and provide them with your records from this visit. Return to the Emergency Room if pain becomes severe or symptoms worsen.

## 2023-09-04 NOTE — ED Notes (Signed)
 Left eye flushed with 30 mls of NS. Tolerated well.

## 2023-09-08 ENCOUNTER — Telehealth: Payer: Self-pay | Admitting: *Deleted
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
## 2023-10-19 ENCOUNTER — Inpatient Hospital Stay (HOSPITAL_BASED_OUTPATIENT_CLINIC_OR_DEPARTMENT_OTHER): Admitting: Hematology and Oncology

## 2023-10-19 ENCOUNTER — Inpatient Hospital Stay: Attending: Hematology and Oncology

## 2023-10-19 VITALS — BP 192/90 | HR 64 | Temp 98.0°F | Resp 17 | Wt 200.9 lb

## 2023-10-19 DIAGNOSIS — Z79899 Other long term (current) drug therapy: Secondary | ICD-10-CM | POA: Diagnosis not present

## 2023-10-19 DIAGNOSIS — D6862 Lupus anticoagulant syndrome: Secondary | ICD-10-CM | POA: Insufficient documentation

## 2023-10-19 DIAGNOSIS — I10 Essential (primary) hypertension: Secondary | ICD-10-CM | POA: Diagnosis not present

## 2023-10-19 DIAGNOSIS — D6861 Antiphospholipid syndrome: Secondary | ICD-10-CM | POA: Diagnosis present

## 2023-10-19 DIAGNOSIS — Z86718 Personal history of other venous thrombosis and embolism: Secondary | ICD-10-CM | POA: Insufficient documentation

## 2023-10-19 DIAGNOSIS — Z7901 Long term (current) use of anticoagulants: Secondary | ICD-10-CM | POA: Insufficient documentation

## 2023-10-19 DIAGNOSIS — C61 Malignant neoplasm of prostate: Secondary | ICD-10-CM | POA: Diagnosis present

## 2023-10-19 LAB — CBC WITH DIFFERENTIAL (CANCER CENTER ONLY)
Abs Immature Granulocytes: 0.01 K/uL (ref 0.00–0.07)
Basophils Absolute: 0 K/uL (ref 0.0–0.1)
Basophils Relative: 1 %
Eosinophils Absolute: 0.1 K/uL (ref 0.0–0.5)
Eosinophils Relative: 2 %
HCT: 38.7 % — ABNORMAL LOW (ref 39.0–52.0)
Hemoglobin: 13.1 g/dL (ref 13.0–17.0)
Immature Granulocytes: 0 %
Lymphocytes Relative: 34 %
Lymphs Abs: 1.4 K/uL (ref 0.7–4.0)
MCH: 27 pg (ref 26.0–34.0)
MCHC: 33.9 g/dL (ref 30.0–36.0)
MCV: 79.8 fL — ABNORMAL LOW (ref 80.0–100.0)
Monocytes Absolute: 0.5 K/uL (ref 0.1–1.0)
Monocytes Relative: 13 %
Neutro Abs: 2 K/uL (ref 1.7–7.7)
Neutrophils Relative %: 50 %
Platelet Count: 223 K/uL (ref 150–400)
RBC: 4.85 MIL/uL (ref 4.22–5.81)
RDW: 14.6 % (ref 11.5–15.5)
WBC Count: 4 K/uL (ref 4.0–10.5)
nRBC: 0 % (ref 0.0–0.2)

## 2023-10-19 LAB — PROTIME-INR
INR: 2.3 — ABNORMAL HIGH (ref 0.8–1.2)
Prothrombin Time: 26.6 s — ABNORMAL HIGH (ref 11.4–15.2)

## 2023-10-19 NOTE — Progress Notes (Signed)
 Patient Care Team: Joshua Francisco, MD as PCP - General (Family Medicine) Grayce Buddle, RN as Oncology Nurse Navigator  DIAGNOSIS:  Encounter Diagnosis  Name Primary?   Antiphospholipid antibody syndrome (HCC) Yes    SUMMARY OF ONCOLOGIC HISTORY: Oncology History  Malignant neoplasm of prostate (HCC)  01/30/2020 Cancer Staging   Staging form: Prostate, AJCC 8th Edition - Clinical stage from 01/30/2020: Stage IIB (cT1c, cN0, cM0, PSA: 10, Grade Group: 2) - Signed by Sherwood Rise, PA-C on 03/27/2020 Histopathologic type: Adenocarcinoma, NOS Stage prefix: Initial diagnosis Prostate specific antigen (PSA) range: 10 to 19 Gleason primary pattern: 3 Gleason secondary pattern: 4 Gleason score: 7 Histologic grading system: 5 grade system Number of biopsy cores examined: 12 Number of biopsy cores positive: 3 Location of positive needle core biopsies: Both sides   03/27/2020 Initial Diagnosis   Malignant neoplasm of prostate (HCC)     CHIEF COMPLIANT: Follow-up on warfarin therapy  HISTORY OF PRESENT ILLNESS: History of Present Illness Harry Lucero is a 68 year old male who presents for routine follow-up on anticoagulation therapy.  He is on warfarin therapy, taking 5 mg daily, with a recent INR of 2.3. He has no bleeding issues, such as epistaxis or gum bleeding. He is interested in starting a workout routine and is mindful of his diet, particularly the intake of greens, to maintain consistency and avoid fluctuations in his INR. He experiences elevated blood pressure readings during medical visits but does not monitor his blood pressure at home. He occasionally experiences headaches.     ALLERGIES:  is allergic to lisinopril .  MEDICATIONS:  Current Outpatient Medications  Medication Sig Dispense Refill   Blood Glucose Monitoring Suppl (CONTOUR NEXT EZ) w/Device KIT See admin instructions.     CONTOUR NEXT TEST test strip daily.     Multiple Vitamin (MULTIVITAMIN) tablet Take 1  tablet by mouth daily.     pravastatin  (PRAVACHOL ) 20 MG tablet Take 20 mg by mouth daily.     Vitamin D , Ergocalciferol , (DRISDOL ) 1.25 MG (50000 UNIT) CAPS capsule Take 50,000 Units by mouth once a week.     warfarin (COUMADIN ) 5 MG tablet TAKE 1 TABLET ON WEDNESDAY, THURSDAY, FRIDAY, SATURDAY. ON ALL OTHER DAYS TAKE 2 TABLETS 120 tablet 3   erythromycin  ophthalmic ointment Place a 1/2 inch ribbon of ointment into the lower eyelid 4-6 times daily for 7 days. (Patient not taking: Reported on 10/19/2023) 3.5 g 0   No current facility-administered medications for this visit.    PHYSICAL EXAMINATION: ECOG PERFORMANCE STATUS: 1 - Symptomatic but completely ambulatory  Vitals:   10/19/23 1412  BP: (!) 192/90  Pulse: 64  Resp: 17  Temp: 98 F (36.7 C)  SpO2: 100%   Filed Weights   10/19/23 1412  Weight: 200 lb 14.4 oz (91.1 kg)      LABORATORY DATA:  I have reviewed the data as listed    Latest Ref Rng & Units 07/21/2023   12:36 AM 05/14/2022    2:07 PM 01/02/2022    4:45 PM  CMP  Glucose 70 - 99 mg/dL 893     BUN 8 - 23 mg/dL 15     Creatinine 9.38 - 1.24 mg/dL 8.74     Sodium 864 - 854 mmol/L 142     Potassium 3.5 - 5.1 mmol/L 3.9     Chloride 98 - 111 mmol/L 107     CO2 22 - 32 mmol/L 25     Calcium 8.9 - 10.3 mg/dL 9.4  Total Protein 6.5 - 8.1 g/dL 7.3  7.2  7.0   Total Bilirubin 0.0 - 1.2 mg/dL 0.9  0.6  1.0   Alkaline Phos 38 - 126 U/L 60     AST 15 - 41 U/L 28  26  29    ALT 0 - 44 U/L 27  30  31      Lab Results  Component Value Date   WBC 4.0 10/19/2023   HGB 13.1 10/19/2023   HCT 38.7 (L) 10/19/2023   MCV 79.8 (L) 10/19/2023   PLT 223 10/19/2023   NEUTROABS 2.0 10/19/2023    ASSESSMENT & PLAN:  Antiphospholipid antibody syndrome (HCC) 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 at [facility] health when he presented with abdominal pain (positive for lupus anticoagulant) SMV thrombosis Splenic vein thrombosis   Current treatment: Switched to  Coumadin  01/09/2020 (when he was diagnosed with positive lupus anticoagulant) Coumadin  toxicities:  GI bleeding 05/17/2021   Hospitalization:  01/09/2020-01/10/2020 attempted TIPS recannulization procedure in IR, unsuccessful 12/25/2020-12/26/2020: Successful balloon assisted sharp recannulization of the portal vein, balloon angioplasty of the portal vein, TIPS endograft relining/stent placement, portal and splenic vein stent placement and coil embolization of splenic parenchyma with access tract   07/29/2023: INR 4  Coumadin  dose: 5 mg daily (previously was alternating 5 and 10 mg dose) Return to clinic every 3 months for labs and in 12 months for follow-up with me ------------------------------------- Assessment and Plan Assessment & Plan Antiphospholipid antibody syndrome with history of venous thromboembolism and portal, splenic, 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 No bleeding complications. - Continue warfarin 5 mg daily. - Monitor INR every three months. - Encourage consistent dietary intake, especially with greens. - Advise against high-risk activities like bungee jumping or scuba diving. - Encourage regular exercise with caution.  Hypertension Elevated blood pressure noted. No home monitoring or symptoms reported. - Recommend home blood pressure monitoring and recording. - Advise discussing management with primary care provider.      No orders of the defined types were placed in this encounter.  The patient has a good understanding of the overall plan. he agrees with it. he will call with any problems that may develop before the next visit here. Total time spent: 30 mins including face to face time and time spent for planning, charting and co-ordination of care   Naomi MARLA Chad, MD 10/19/23

## 2023-10-19 NOTE — Assessment & Plan Note (Signed)
 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 at [facility] when he presented with abdominal pain (positive for lupus anticoagulant) SMV thrombosis Splenic vein thrombosis   Current treatment: Switched to Coumadin  01/09/2020 (when he was diagnosed with positive lupus anticoagulant) Coumadin  toxicities:  GI bleeding 05/17/2021   Hospitalization:  01/09/2020-01/10/2020 attempted TIPS recannulization procedure in IR, unsuccessful 12/25/2020-12/26/2020: Successful balloon assisted sharp recannulization of the portal vein, balloon angioplasty of the portal vein, TIPS endograft relining/stent placement, portal and splenic vein stent placement and coil embolization of splenic parenchyma with access tract   07/29/2023: INR 4  Coumadin  dose: 5 mg alternating with 10 mg Return to clinic every 2 months for labs and in 12 months for follow-up with me

## 2023-10-28 ENCOUNTER — Other Ambulatory Visit: Payer: PPO

## 2023-10-28 ENCOUNTER — Ambulatory Visit: Payer: PPO | Admitting: Hematology and Oncology

## 2024-01-19 ENCOUNTER — Inpatient Hospital Stay: Attending: Hematology and Oncology

## 2024-01-19 DIAGNOSIS — Z8546 Personal history of malignant neoplasm of prostate: Secondary | ICD-10-CM | POA: Insufficient documentation

## 2024-01-19 DIAGNOSIS — Z7901 Long term (current) use of anticoagulants: Secondary | ICD-10-CM | POA: Insufficient documentation

## 2024-01-19 DIAGNOSIS — D6861 Antiphospholipid syndrome: Secondary | ICD-10-CM | POA: Diagnosis present

## 2024-01-19 LAB — CBC WITH DIFFERENTIAL (CANCER CENTER ONLY)
Abs Immature Granulocytes: 0.01 K/uL (ref 0.00–0.07)
Basophils Absolute: 0 K/uL (ref 0.0–0.1)
Basophils Relative: 1 %
Eosinophils Absolute: 0 K/uL (ref 0.0–0.5)
Eosinophils Relative: 1 %
HCT: 39.1 % (ref 39.0–52.0)
Hemoglobin: 13.2 g/dL (ref 13.0–17.0)
Immature Granulocytes: 0 %
Lymphocytes Relative: 42 %
Lymphs Abs: 1.6 K/uL (ref 0.7–4.0)
MCH: 27.1 pg (ref 26.0–34.0)
MCHC: 33.8 g/dL (ref 30.0–36.0)
MCV: 80.3 fL (ref 80.0–100.0)
Monocytes Absolute: 0.6 K/uL (ref 0.1–1.0)
Monocytes Relative: 16 %
Neutro Abs: 1.5 K/uL — ABNORMAL LOW (ref 1.7–7.7)
Neutrophils Relative %: 40 %
Platelet Count: 241 K/uL (ref 150–400)
RBC: 4.87 MIL/uL (ref 4.22–5.81)
RDW: 14.3 % (ref 11.5–15.5)
WBC Count: 3.7 K/uL — ABNORMAL LOW (ref 4.0–10.5)
nRBC: 0 % (ref 0.0–0.2)

## 2024-01-19 LAB — PROTIME-INR
INR: 2.2 — ABNORMAL HIGH (ref 0.8–1.2)
Prothrombin Time: 25.5 s — ABNORMAL HIGH (ref 11.4–15.2)

## 2024-04-20 ENCOUNTER — Inpatient Hospital Stay

## 2024-07-19 ENCOUNTER — Inpatient Hospital Stay

## 2024-10-19 ENCOUNTER — Inpatient Hospital Stay: Admitting: Hematology and Oncology

## 2024-10-19 ENCOUNTER — Inpatient Hospital Stay
